# Patient Record
Sex: Female | Born: 1945
Health system: Southern US, Community
[De-identification: ages and names within clinical notes are randomized; demographics above are authoritative.]

## PROBLEM LIST (undated history)

## (undated) DIAGNOSIS — I10 Essential (primary) hypertension: Secondary | ICD-10-CM

## (undated) DIAGNOSIS — F32A Depression, unspecified: Secondary | ICD-10-CM

## (undated) DIAGNOSIS — M810 Age-related osteoporosis without current pathological fracture: Secondary | ICD-10-CM

## (undated) DIAGNOSIS — Z5189 Encounter for other specified aftercare: Secondary | ICD-10-CM

## (undated) DIAGNOSIS — Z972 Presence of dental prosthetic device (complete) (partial): Secondary | ICD-10-CM

## (undated) DIAGNOSIS — H409 Unspecified glaucoma: Secondary | ICD-10-CM

## (undated) DIAGNOSIS — Z8619 Personal history of other infectious and parasitic diseases: Secondary | ICD-10-CM

## (undated) DIAGNOSIS — R2689 Other abnormalities of gait and mobility: Secondary | ICD-10-CM

## (undated) DIAGNOSIS — F419 Anxiety disorder, unspecified: Secondary | ICD-10-CM

## (undated) DIAGNOSIS — H269 Unspecified cataract: Secondary | ICD-10-CM

## (undated) DIAGNOSIS — T8859XA Other complications of anesthesia, initial encounter: Secondary | ICD-10-CM

## (undated) DIAGNOSIS — K759 Inflammatory liver disease, unspecified: Secondary | ICD-10-CM

## (undated) DIAGNOSIS — A419 Sepsis, unspecified organism: Secondary | ICD-10-CM

## (undated) DIAGNOSIS — K219 Gastro-esophageal reflux disease without esophagitis: Secondary | ICD-10-CM

## (undated) HISTORY — DX: Sepsis, unspecified organism: A41.9

## (undated) HISTORY — DX: Unspecified glaucoma: H40.9

## (undated) HISTORY — DX: Anxiety disorder, unspecified: F41.9

## (undated) HISTORY — DX: Unspecified cataract: H26.9

## (undated) HISTORY — DX: Age-related osteoporosis without current pathological fracture: M81.0

## (undated) HISTORY — PX: ABDOMINAL HYSTERECTOMY: SHX81

## (undated) HISTORY — DX: Encounter for other specified aftercare: Z51.89

## (undated) HISTORY — PX: OTHER SURGICAL HISTORY: SHX169

## (undated) HISTORY — DX: Essential (primary) hypertension: I10

## (undated) HISTORY — PX: STOMACH SURGERY: SHX791

## (undated) HISTORY — DX: Personal history of other infectious and parasitic diseases: Z86.19

## (undated) HISTORY — DX: Gastro-esophageal reflux disease without esophagitis: K21.9

---

## 2001-01-11 DIAGNOSIS — F419 Anxiety disorder, unspecified: Secondary | ICD-10-CM | POA: Insufficient documentation

## 2001-01-11 DIAGNOSIS — G4700 Insomnia, unspecified: Secondary | ICD-10-CM | POA: Insufficient documentation

## 2001-01-11 DIAGNOSIS — K219 Gastro-esophageal reflux disease without esophagitis: Secondary | ICD-10-CM | POA: Insufficient documentation

## 2003-06-04 ENCOUNTER — Other Ambulatory Visit: Payer: Self-pay

## 2004-10-22 ENCOUNTER — Ambulatory Visit: Payer: Self-pay | Admitting: Family Medicine

## 2005-01-11 HISTORY — PX: COLONOSCOPY: SHX174

## 2006-05-06 DIAGNOSIS — I10 Essential (primary) hypertension: Secondary | ICD-10-CM | POA: Insufficient documentation

## 2009-01-11 HISTORY — PX: MOUTH SURGERY: SHX715

## 2009-08-10 DIAGNOSIS — R7303 Prediabetes: Secondary | ICD-10-CM | POA: Insufficient documentation

## 2010-01-21 ENCOUNTER — Ambulatory Visit: Payer: Self-pay | Admitting: Family Medicine

## 2013-02-13 ENCOUNTER — Ambulatory Visit: Payer: Self-pay | Admitting: Family Medicine

## 2013-02-13 DIAGNOSIS — M810 Age-related osteoporosis without current pathological fracture: Secondary | ICD-10-CM | POA: Insufficient documentation

## 2013-08-21 LAB — CBC AND DIFFERENTIAL
HCT: 39 % (ref 36–46)
Hemoglobin: 12.6 g/dL (ref 12.0–16.0)
Platelets: 319 10*3/uL (ref 150–399)
WBC: 7.3 10^3/mL

## 2013-08-21 LAB — BASIC METABOLIC PANEL
BUN: 10 mg/dL (ref 4–21)
Creatinine: 0.9 mg/dL (ref 0.5–1.1)
GLUCOSE: 133 mg/dL
Potassium: 5.5 mmol/L — AB (ref 3.4–5.3)
SODIUM: 140 mmol/L (ref 137–147)

## 2013-08-21 LAB — TSH: TSH: 2.3 u[IU]/mL (ref 0.41–5.90)

## 2013-08-21 LAB — LIPID PANEL
Cholesterol: 168 mg/dL (ref 0–200)
HDL: 44 mg/dL (ref 35–70)
LDL Cholesterol: 103 mg/dL
Triglycerides: 103 mg/dL (ref 40–160)

## 2013-08-21 LAB — HEPATIC FUNCTION PANEL
ALT: 28 U/L (ref 7–35)
AST: 34 U/L (ref 13–35)

## 2014-03-27 DIAGNOSIS — H40003 Preglaucoma, unspecified, bilateral: Secondary | ICD-10-CM | POA: Diagnosis not present

## 2014-04-23 ENCOUNTER — Ambulatory Visit
Admit: 2014-04-23 | Disposition: A | Discharge status: Home / Self Care | Payer: Self-pay | Attending: Family Medicine | Admitting: Family Medicine

## 2014-04-23 DIAGNOSIS — Z1231 Encounter for screening mammogram for malignant neoplasm of breast: Secondary | ICD-10-CM | POA: Diagnosis not present

## 2014-05-29 DIAGNOSIS — M810 Age-related osteoporosis without current pathological fracture: Secondary | ICD-10-CM | POA: Diagnosis not present

## 2014-05-29 DIAGNOSIS — I10 Essential (primary) hypertension: Secondary | ICD-10-CM | POA: Diagnosis not present

## 2014-05-29 DIAGNOSIS — K219 Gastro-esophageal reflux disease without esophagitis: Secondary | ICD-10-CM | POA: Diagnosis not present

## 2014-05-29 DIAGNOSIS — R7309 Other abnormal glucose: Secondary | ICD-10-CM | POA: Diagnosis not present

## 2014-05-29 LAB — HEMOGLOBIN A1C: HEMOGLOBIN A1C: 5.8 % (ref 4.0–6.0)

## 2014-06-13 ENCOUNTER — Telehealth: Payer: Self-pay | Admitting: Family Medicine

## 2014-06-13 MED ORDER — DIAZEPAM 5 MG PO TABS: 5.0000 mg | ORAL_TABLET | Freq: Four times a day (QID) | ORAL | Status: DC

## 2014-06-13 NOTE — Telephone Encounter (Signed)
Pt called back to see if this had been refilled. Pt state that she has been trying to get this refilled before she ran out and she can not go without this medication. Pt was crying on the phone because she is afraid this isn't going to be called in today. Thanks TNP

## 2014-06-13 NOTE — Telephone Encounter (Signed)
Please call in rx for diazepam 5mg  one tablet four times daily, as needed, #120, rf x 5

## 2014-06-13 NOTE — Telephone Encounter (Signed)
Pt called to request a refill on Diazepam 5mg .  Pt states she took her last pill last night.  [facility].  BJ#[phone]/YW

## 2014-06-13 NOTE — Telephone Encounter (Signed)
Patient notified. Rx phoned into pharmacy.

## 2014-10-23 DIAGNOSIS — H401131 Primary open-angle glaucoma, bilateral, mild stage: Secondary | ICD-10-CM | POA: Diagnosis not present

## 2014-11-22 DIAGNOSIS — H409 Unspecified glaucoma: Secondary | ICD-10-CM | POA: Insufficient documentation

## 2014-11-22 DIAGNOSIS — L989 Disorder of the skin and subcutaneous tissue, unspecified: Secondary | ICD-10-CM | POA: Insufficient documentation

## 2014-11-22 DIAGNOSIS — E789 Disorder of lipoprotein metabolism, unspecified: Secondary | ICD-10-CM | POA: Insufficient documentation

## 2014-11-26 ENCOUNTER — Encounter: Payer: Self-pay | Admitting: Family Medicine

## 2014-11-26 ENCOUNTER — Ambulatory Visit (INDEPENDENT_AMBULATORY_CARE_PROVIDER_SITE_OTHER): Payer: Medicare Other | Admitting: Family Medicine

## 2014-11-26 VITALS — BP 128/70 | HR 64 | Temp 98.5°F | Resp 16 | Wt 184.0 lb

## 2014-11-26 DIAGNOSIS — Z23 Encounter for immunization: Secondary | ICD-10-CM | POA: Diagnosis not present

## 2014-11-26 DIAGNOSIS — R7303 Prediabetes: Secondary | ICD-10-CM | POA: Diagnosis not present

## 2014-11-26 DIAGNOSIS — K219 Gastro-esophageal reflux disease without esophagitis: Secondary | ICD-10-CM

## 2014-11-26 DIAGNOSIS — I10 Essential (primary) hypertension: Secondary | ICD-10-CM

## 2014-11-26 LAB — POCT GLYCOSYLATED HEMOGLOBIN (HGB A1C)
ESTIMATED AVERAGE GLUCOSE: 123
HEMOGLOBIN A1C: 5.9

## 2014-11-26 NOTE — Progress Notes (Signed)
Patient: Joan Baldwin Female    DOB: [birth year]   69 y.o.   MRN: [id number] Visit Date: 11/26/2014  Today's Provider: Lelon Huh, MD   Chief Complaint  Patient presents with  . Hypertension    follow up  . Diabetes    follow up  . Gastroesophageal Reflux    follow up  . Anxiety    follow up   Subjective:    HPI   Hypertension, follow-up:  BP Readings from Last 3 Encounters:  11/26/14 128/70  05/29/14 124/70    She was last seen for hypertension 6 months ago.  BP at that visit was  124/70. Management since that visit includes no changes. She reports good compliance with treatment. She is not having side effects.  She is exercising. She is not adherent to low salt diet.   Outside blood pressures are 130's/70's. She is experiencing none.  Patient denies chest pain, chest pressure/discomfort, claudication, dyspnea, exertional chest pressure/discomfort, fatigue, irregular heart beat, lower extremity edema, near-syncope, orthopnea, palpitations and paroxysmal nocturnal dyspnea.   Cardiovascular risk factors include advanced age (older than 71 for men, 66 for women), dyslipidemia and hypertension.  Use of agents associated with hypertension: NSAIDS.     Weight trend: stable Wt Readings from Last 3 Encounters:  11/26/14 184 lb (83.462 kg)  05/29/14 176 lb (79.833 kg)    Current diet: in general, an "unhealthy" diet  ------------------------------------------------------------------------   GERD, Follow up:  The patient was last seen for GERD 6 months ago. Changes made since that visit include no changes.  She reports good compliance with treatment. Current treatment includes Omeprazole 20mg  daily. She is not having side effects.     ------------------------------------------------------------------------   Prediabetes, Follow-up:   Lab Results  Component Value Date   HGBA1C 5.8 05/29/2014    Last seen for for this6 months ago.    Management since that visit includes no changes. Current symptoms include none and have been stable.  Weight trend: stable Prior visit with dietician: no Current diet: in general, an "unhealthy" diet Current exercise: walking the treadmill twice a week  Pertinent Labs:    Component Value Date/Time   CHOL 168 08/21/2013   TRIG 103 08/21/2013   CREATININE 0.9 08/21/2013    Wt Readings from Last 3 Encounters:  11/26/14 184 lb (83.462 kg)  05/29/14 176 lb (79.833 kg)    Anxiety Follow up: Last office visit was 6 month ago and no changes were made. Patient reports good compliance with treatment, good tolerance and good symptom control.     Allergies  Allergen Reactions  . Codeine Itching  . Ultram  [Tramadol Hcl]     Insomnia   Previous Medications   ASPIRIN 81 MG TABLET    Take 1 tablet by mouth daily.   CALCIUM CARBONATE-VITAMIN D (CALCIUM 600/VITAMIN D PO)    Take 1 tablet by mouth 2 (two) times daily.   DIAZEPAM (VALIUM) 5 MG TABLET    Take 1 tablet (5 mg total) by mouth 4 (four) times daily. Take 1 tablet 1 times daily   FIBER POWD    Take 2 scoop by mouth daily.   HYDROCHLOROTHIAZIDE (HYDRODIURIL) 25 MG TABLET    Take 1 tablet by mouth daily.   LATANOPROST (XALATAN) 0.005 % OPHTHALMIC SOLUTION    Place 1 drop into both eyes at bedtime.   LISINOPRIL (PRINIVIL,ZESTRIL) 10 MG TABLET    Take 0.5 tablets by mouth daily.   MULTIPLE VITAMINS-MINERALS (  MULTIVITAMIN ADULT PO)    Take 1 tablet by mouth daily.   OMEGA-3 FATTY ACIDS (FISH OIL) 1000 MG CAPS    Take 1 capsule by mouth 2 (two) times daily.   OMEPRAZOLE (PRILOSEC) 20 MG CAPSULE    Take 1 capsule by mouth daily.   TIMOLOL MALEATE 0.5 % (DAILY) SOLN    Place 1 drop into both eyes at bedtime.    Review of Systems  Constitutional: Negative for fever, chills, diaphoresis, appetite change and fatigue.  Respiratory: Negative for chest tightness and shortness of breath.   Cardiovascular: Negative for chest pain and  palpitations.  Gastrointestinal: Negative for nausea, vomiting and abdominal pain.  Endocrine: Negative for cold intolerance, heat intolerance, polydipsia, polyphagia and polyuria.  Neurological: Negative for dizziness and weakness.  Hematological: Bruises/bleeds easily.    Social History  Substance Use Topics  . Smoking status: Former Smoker -- 1.00 packs/day for 30 years    Types: Cigarettes    Quit date: 01/11/2001  . Smokeless tobacco: Not on file  . Alcohol Use: No   Objective:   BP 128/70 mmHg  Pulse 64  Temp(Src) 98.5 F (36.9 C) (Oral)  Resp 16  Wt 184 lb (83.462 kg)  SpO2 96%  Physical Exam   General Appearance:    Alert, cooperative, no distress  Eyes:    PERRL, conjunctiva/corneas clear, EOM's intact       Lungs:     Clear to auscultation bilaterally, respirations unlabored  Heart:    Regular rate and rhythm  Neurologic:   Awake, alert, oriented x 3. No apparent focal neurological           defect.        Results for orders placed or performed in visit on 11/26/14  POCT HgB A1C  Result Value Ref Range   Hemoglobin A1C 5.9    Est. average glucose Bld gHb Est-mCnc 123        Assessment & Plan:     1. Prediabetes  - POCT HgB A1C  2. Need for influenza vaccination  - Flu vaccine HIGH DOSE PF (Fluzone High dose)  3. Gastroesophageal reflux disease without esophagitis Doing well on daily omeprazole. She is interested in getting off this medication. Recommend she try taking QOD. Consider h2blocker.  - Magnesium  4. Essential (primary) hypertension Well controlled.  Continue current medications.   - Renal function panel - Magnesium  5. Need for pneumococcal vaccination  - Pneumococcal conjugate vaccine 13-valent IM       Lelon Huh, MD  [facility]

## 2014-11-27 ENCOUNTER — Encounter: Payer: Self-pay | Admitting: Family Medicine

## 2014-11-27 LAB — RENAL FUNCTION PANEL
Albumin: 4.7 g/dL (ref 3.5–4.8)
BUN / CREAT RATIO: 13 (ref 11–26)
BUN: 11 mg/dL (ref 8–27)
CO2: 26 mmol/L (ref 18–29)
CREATININE: 0.83 mg/dL (ref 0.57–1.00)
Calcium: 9.6 mg/dL (ref 8.7–10.3)
Chloride: 93 mmol/L — ABNORMAL LOW (ref 97–106)
GFR calc non Af Amer: 71 mL/min/{1.73_m2} (ref >59)
GFR, EST AFRICAN AMERICAN: 82 mL/min/{1.73_m2} (ref >59)
Glucose: 121 mg/dL — ABNORMAL HIGH (ref 65–99)
Phosphorus: 4.1 mg/dL (ref 2.5–4.5)
Potassium: 4.7 mmol/L (ref 3.5–5.2)
SODIUM: 136 mmol/L (ref 136–144)

## 2014-11-27 LAB — MAGNESIUM: Magnesium: 2.2 mg/dL (ref 1.6–2.3)

## 2014-12-08 ENCOUNTER — Other Ambulatory Visit: Payer: Self-pay | Admitting: Family Medicine

## 2014-12-08 NOTE — Telephone Encounter (Signed)
Please call in diazepam.  

## 2014-12-09 NOTE — Telephone Encounter (Signed)
Rx called in to pharmacy. 

## 2015-02-04 ENCOUNTER — Other Ambulatory Visit: Payer: Self-pay | Admitting: Family Medicine

## 2015-02-04 DIAGNOSIS — F419 Anxiety disorder, unspecified: Secondary | ICD-10-CM

## 2015-02-04 NOTE — Telephone Encounter (Signed)
Please call in diazepam.  

## 2015-04-02 DIAGNOSIS — H401131 Primary open-angle glaucoma, bilateral, mild stage: Secondary | ICD-10-CM | POA: Diagnosis not present

## 2015-04-03 ENCOUNTER — Other Ambulatory Visit: Payer: Self-pay | Admitting: Family Medicine

## 2015-04-04 NOTE — Telephone Encounter (Signed)
Please call in diazepam.  

## 2015-04-22 DIAGNOSIS — H401131 Primary open-angle glaucoma, bilateral, mild stage: Secondary | ICD-10-CM | POA: Diagnosis not present

## 2015-05-04 ENCOUNTER — Other Ambulatory Visit: Payer: Self-pay | Admitting: Family Medicine

## 2015-05-05 ENCOUNTER — Other Ambulatory Visit: Payer: Self-pay | Admitting: Family Medicine

## 2015-05-05 NOTE — Telephone Encounter (Signed)
Rx called in to pharmacy. 

## 2015-05-05 NOTE — Telephone Encounter (Signed)
Please call in diazepam.  

## 2015-05-26 ENCOUNTER — Ambulatory Visit: Payer: Medicare Other | Admitting: Family Medicine

## 2015-05-26 ENCOUNTER — Encounter: Payer: Self-pay | Admitting: Family Medicine

## 2015-05-26 ENCOUNTER — Ambulatory Visit (INDEPENDENT_AMBULATORY_CARE_PROVIDER_SITE_OTHER): Payer: Medicare Other | Admitting: Family Medicine

## 2015-05-26 VITALS — BP 142/78 | HR 68 | Temp 98.6°F | Resp 16 | Wt 183.0 lb

## 2015-05-26 DIAGNOSIS — I10 Essential (primary) hypertension: Secondary | ICD-10-CM | POA: Diagnosis not present

## 2015-05-26 DIAGNOSIS — R7303 Prediabetes: Secondary | ICD-10-CM

## 2015-05-26 DIAGNOSIS — K219 Gastro-esophageal reflux disease without esophagitis: Secondary | ICD-10-CM

## 2015-05-26 LAB — POCT GLYCOSYLATED HEMOGLOBIN (HGB A1C)
Est. average glucose Bld gHb Est-mCnc: 131
HEMOGLOBIN A1C: 6.2

## 2015-05-26 NOTE — Progress Notes (Signed)
Patient: Joan Baldwin Female    DOB: May 01, 1945   70 y.o.   MRN: [id number] Visit Date: 05/26/2015  Today's Provider: Lelon Huh, MD   Chief Complaint  Patient presents with  . Hypertension    follow up  . Gastroesophageal Reflux    follow up  . Hyperglycemia    follow up   Subjective:    HPI  Hypertension, follow-up:  BP Readings from Last 3 Encounters:  11/26/14 128/70  05/29/14 124/70    She was last seen for hypertension 6 months ago.  BP at that visit was 128/70. Management since that visit includes no changes. She reports good compliance with treatment. She is not having side effects.  She is not exercising. She is not adherent to low salt diet.   Outside blood pressures are 130/60. She is experiencing dyspnea.  Patient denies chest pain, chest pressure/discomfort, claudication, exertional chest pressure/discomfort, fatigue, irregular heart beat, lower extremity edema, near-syncope, orthopnea, palpitations and paroxysmal nocturnal dyspnea.   Cardiovascular risk factors include advanced age (older than 62 for men, 27 for women) and hypertension.  Use of agents associated with hypertension: NSAIDS.     Weight trend: stable Wt Readings from Last 3 Encounters:  11/26/14 184 lb (83.462 kg)  05/29/14 176 lb (79.833 kg)    Current diet: in general, an "unhealthy" diet  ------------------------------------------------------------------------  Follow up GERD: Last office visit was 6 months ago. Management during that visit includes advising patient to take Omeprazole every other day instead of daily due to patients desire to come off medication. Today patient comes in stating she tried cutting back to 1 tablet every other day, but reflux worsened so she went back to 1 pill every day.     Prediabetes, Follow-up:   Lab Results  Component Value Date   HGBA1C 5.9 11/26/2014   HGBA1C 5.8 05/29/2014   GLUCOSE 121* 11/26/2014    Last seen for for  this6 months ago.  Management since that visit includes no changes. Current symptoms include visual disturbances and have been stable.  Weight trend: stable Prior visit with dietician: no Current diet: in general, an "unhealthy" diet   She admits to eating ice cream nearly every day and has been consuming increasing amounts over the last several months.   Current exercise: none  Pertinent Labs:    Component Value Date/Time   CHOL 168 08/21/2013   TRIG 103 08/21/2013   CREATININE 0.83 11/26/2014 1129   CREATININE 0.9 08/21/2013    Wt Readings from Last 3 Encounters:  11/26/14 184 lb (83.462 kg)  05/29/14 176 lb (79.833 kg)       Allergies  Allergen Reactions  . Codeine Itching  . Ultram  [Tramadol Hcl]     Insomnia   Previous Medications   ASPIRIN 81 MG TABLET    Take 1 tablet by mouth daily.   CALCIUM CARBONATE-VITAMIN D (CALCIUM 600/VITAMIN D PO)    Take 1 tablet by mouth 2 (two) times daily.   DIAZEPAM (VALIUM) 5 MG TABLET    TAKE ONE TABLET BY MOUTH EVERY 6 HOURS AS NEEDED FOR ANXIETY   FIBER POWD    Take 2 scoop by mouth daily.   HYDROCHLOROTHIAZIDE (HYDRODIURIL) 25 MG TABLET    TAKE ONE TABLET BY MOUTH ONCE DAILY   LATANOPROST (XALATAN) 0.005 % OPHTHALMIC SOLUTION    Place 1 drop into both eyes at bedtime.   LISINOPRIL (PRINIVIL,ZESTRIL) 10 MG TABLET    TAKE ONE-HALF TABLET  BY MOUTH ONCE DAILY   MULTIPLE VITAMINS-MINERALS (MULTIVITAMIN ADULT PO)    Take 1 tablet by mouth daily.   OMEGA-3 FATTY ACIDS (FISH OIL) 1000 MG CAPS    Take 1 capsule by mouth 2 (two) times daily.   OMEPRAZOLE (PRILOSEC) 20 MG CAPSULE    TAKE ONE CAPSULE BY MOUTH ONCE DAILY   TIMOLOL MALEATE 0.5 % (DAILY) SOLN    Place 1 drop into both eyes at bedtime.    Review of Systems  Constitutional: Negative for fever, chills, appetite change and fatigue.  Respiratory: Positive for shortness of breath. Negative for chest tightness.   Cardiovascular: Negative for chest pain and palpitations.    Gastrointestinal: Negative for nausea, vomiting and abdominal pain.  Neurological: Negative for dizziness and weakness.    Social History  Substance Use Topics  . Smoking status: Former Smoker -- 1.00 packs/day for 30 years    Types: Cigarettes    Quit date: 01/11/2001  . Smokeless tobacco: Not on file  . Alcohol Use: No   Objective:   BP 142/78 mmHg  Pulse 68  Temp(Src) 98.6 F (37 C) (Oral)  Resp 16  Wt 183 lb (83.008 kg)  SpO2 98%  Physical Exam  General appearance: alert, well developed, well nourished, cooperative and in no distress Head: Normocephalic, without obvious abnormality, atraumatic Lungs: Respirations even and unlabored Extremities: No gross deformities Skin: Skin color, texture, turgor normal. No rashes seen  Psych: Appropriate mood and affect. Neurologic: Mental status: Alert, oriented to person, place, and time, thought content appropriate.   Results for orders placed or performed in visit on 05/26/15  POCT HgB A1C  Result Value Ref Range   Hemoglobin A1C 6.2    Est. average glucose Bld gHb Est-mCnc 131         Assessment & Plan:     1. Prediabetes Counseled extensively on healthy eating choices, particularly to avoid ICE Cream which she consumes excessively - POCT HgB A1C  2. Essential (primary) hypertension Well controlled.  Continue current medications.    3. Gastroesophageal reflux disease without esophagitis Well controlled.  Continue current medications.          Lelon Huh, MD  [facility]

## 2015-05-26 NOTE — Patient Instructions (Signed)
It is recommended to engage in 150 minutes of moderate exercise every week.   Avoid sweets and white starchy foods Healthy snacks include nuts, vegetables (except potatoes and corn), and eggs.

## 2015-08-01 ENCOUNTER — Other Ambulatory Visit: Payer: Self-pay | Admitting: Family Medicine

## 2015-08-01 NOTE — Telephone Encounter (Signed)
Rx called in.   Thanks,   -Laura  

## 2015-08-01 NOTE — Telephone Encounter (Signed)
Pt stated that the pharmacy called her to advised that the fax they received from our office for the RX of diazepam (VALIUM) 5 MG tablet was blank and asked that we re-fax it. Please see Dr. Maralyn Sago response below. The pharmacy has faxed another request for the refill as well. Pt request that this be called in today b/c she will run out over the weekend. Thanks TNP

## 2015-08-01 NOTE — Telephone Encounter (Signed)
Prescription phoned into pharmacy. Patient advised.

## 2015-08-01 NOTE — Telephone Encounter (Signed)
This was already called in today.   Thanks,   -Mickel Baas

## 2015-08-01 NOTE — Telephone Encounter (Signed)
Please call in diazepam.  

## 2015-08-01 NOTE — Telephone Encounter (Signed)
Pt contacted office for refill request on the following medications:  diazepam (VALIUM) 5 MG tablet.  [facility].  [phone]

## 2015-10-22 DIAGNOSIS — H401131 Primary open-angle glaucoma, bilateral, mild stage: Secondary | ICD-10-CM | POA: Diagnosis not present

## 2015-10-30 ENCOUNTER — Other Ambulatory Visit: Payer: Self-pay | Admitting: Family Medicine

## 2015-11-24 ENCOUNTER — Ambulatory Visit (INDEPENDENT_AMBULATORY_CARE_PROVIDER_SITE_OTHER): Payer: Medicare Other | Admitting: Family Medicine

## 2015-11-24 ENCOUNTER — Encounter: Payer: Self-pay | Admitting: Family Medicine

## 2015-11-24 VITALS — BP 120/80 | HR 65 | Temp 98.2°F | Resp 16 | Wt 169.0 lb

## 2015-11-24 DIAGNOSIS — I10 Essential (primary) hypertension: Secondary | ICD-10-CM | POA: Diagnosis not present

## 2015-11-24 DIAGNOSIS — F419 Anxiety disorder, unspecified: Secondary | ICD-10-CM | POA: Diagnosis not present

## 2015-11-24 DIAGNOSIS — R7303 Prediabetes: Secondary | ICD-10-CM

## 2015-11-24 DIAGNOSIS — K219 Gastro-esophageal reflux disease without esophagitis: Secondary | ICD-10-CM | POA: Diagnosis not present

## 2015-11-24 LAB — POCT GLYCOSYLATED HEMOGLOBIN (HGB A1C)
Est. average glucose Bld gHb Est-mCnc: 126
Hemoglobin A1C: 6

## 2015-11-24 MED ORDER — DIAZEPAM 5 MG PO TABS
5.0000 mg | ORAL_TABLET | Freq: Four times a day (QID) | ORAL | 5 refills | Status: DC | PRN
Start: 2015-11-24 — End: 2016-05-26

## 2015-11-24 NOTE — Progress Notes (Signed)
Patient: Joan Baldwin Female    DOB: 02-03-1945   70 y.o.   MRN: [id number] Visit Date: 11/24/2015  Today's Provider: Lelon Huh, MD   Chief Complaint  Patient presents with  . Follow-up  . Gastroesophageal Reflux  . Hypertension   Subjective:    HPI   Prediabetes From 05/26/2015- Counseled extensively on healthy eating choices, particularly to avoid ICE Cream.  HgbA1c-6.2  Wt Readings from Last 3 Encounters:  11/24/15 169 lb (76.7 kg)  05/26/15 183 lb (83 kg)  11/26/14 184 lb (83.5 kg)    Gastroesophageal reflux disease without esophagitis From 05/26/2015-no changes. Having no reflux symptoms on omeprazole.     Hypertension, follow-up:  BP Readings from Last 3 Encounters:  05/26/15 (!) 142/78  11/26/14 128/70  05/29/14 124/70    She was last seen for hypertension 6 months ago.  BP at that visit was 142/78. Management since that visit includes; no changes.She reports good compliance with treatment. She is not having side effects. none She is exercising. She is adherent to low salt diet.   Outside blood pressures are normal. She is experiencing none.  Patient denies none.   Cardiovascular risk factors include prediabetes.  Use of agents associated with hypertension: none.   ----------------------------------------------------------------  Follow up anxiety She continue to take diazepam twice every day which she feels continues to work well. Denies any difficulty with memory, fatigue, or confusion.   Allergies  Allergen Reactions  . Codeine Itching  . Ultram  [Tramadol Hcl]     Insomnia     Current Outpatient Prescriptions:  .  aspirin 81 MG tablet, Take 1 tablet by mouth daily., Disp: , Rfl:  .  Calcium Carbonate-Vitamin D (CALCIUM 600/VITAMIN D PO), Take 1 tablet by mouth 2 (two) times daily., Disp: , Rfl:  .  diazepam (VALIUM) 5 MG tablet, TAKE ONE TABLET BY MOUTH EVERY 6 HOURS AS NEEDED FOR ANXIETY, Disp: 120 tablet, Rfl: 3 .  Fiber  POWD, Take 2 scoop by mouth daily., Disp: , Rfl:  .  hydrochlorothiazide (HYDRODIURIL) 25 MG tablet, TAKE ONE TABLET BY MOUTH ONCE DAILY, Disp: 30 tablet, Rfl: 12 .  latanoprost (XALATAN) 0.005 % ophthalmic solution, Place 1 drop into both eyes at bedtime., Disp: , Rfl:  .  lisinopril (PRINIVIL,ZESTRIL) 10 MG tablet, TAKE ONE-HALF TABLET BY MOUTH ONCE DAILY, Disp: 30 tablet, Rfl: 12 .  Multiple Vitamins-Minerals (MULTIVITAMIN ADULT PO), Take 1 tablet by mouth daily., Disp: , Rfl:  .  Omega-3 Fatty Acids (FISH OIL) 1000 MG CAPS, Take 1 capsule by mouth 2 (two) times daily., Disp: , Rfl:  .  omeprazole (PRILOSEC) 20 MG capsule, TAKE ONE CAPSULE BY MOUTH ONCE DAILY, Disp: 30 capsule, Rfl: 12 .  Timolol Maleate 0.5 % (DAILY) SOLN, Place 1 drop into both eyes at bedtime., Disp: , Rfl:   Review of Systems  Constitutional: Negative for appetite change, chills, fatigue and fever.  Respiratory: Negative for chest tightness and shortness of breath.   Cardiovascular: Negative for chest pain and palpitations.  Gastrointestinal: Negative for abdominal pain, nausea and vomiting.  Neurological: Negative for dizziness and weakness.    Social History  Substance Use Topics  . Smoking status: Former Smoker    Packs/day: 1.00    Years: 30.00    Types: Cigarettes    Quit date: 01/11/2001  . Smokeless tobacco: Not on file  . Alcohol use No   Objective:   BP 120/80 (BP Location: Right Arm, Patient  Position: Sitting, Cuff Size: Normal)   Pulse 65   Temp 98.2 F (36.8 C) (Oral)   Resp 16   Wt 169 lb (76.7 kg)   SpO2 100%   Physical Exam   General Appearance:    Alert, cooperative, no distress  Eyes:    PERRL, conjunctiva/corneas clear, EOM's intact       Lungs:     Clear to auscultation bilaterally, respirations unlabored  Heart:    Regular rate and rhythm  Neurologic:   Awake, alert, oriented x 3. No apparent focal neurological           defect.       Results for orders placed or performed in  visit on 11/24/15  POCT glycosylated hemoglobin (Hb A1C)  Result Value Ref Range   Hemoglobin A1C 6.0    Est. average glucose Bld gHb Est-mCnc 126        Assessment & Plan:     1. Prediabetes Improving. Continue to avoid sweets and starchy foods and try to exercise more.  - POCT glycosylated hemoglobin (Hb A1C)  2. Essential (primary) hypertension Well controlled.    3. Anxiety Doing well with current regiment of diazepam which was refilled today.   4. Gastroesophageal reflux disease without esophagitis Doing well on omeprazole. Continue current regiment.   Follow up 6 months.       The entirety of the information documented in the History of Present Illness, Review of Systems and Physical Exam were personally obtained by me. Portions of this information were initially documented by Theressa Millard, CMA and reviewed by me for thoroughness and accuracy.    Lelon Huh, MD  [facility]

## 2015-12-30 ENCOUNTER — Other Ambulatory Visit: Payer: Self-pay | Admitting: Family Medicine

## 2016-01-16 ENCOUNTER — Ambulatory Visit (INDEPENDENT_AMBULATORY_CARE_PROVIDER_SITE_OTHER): Payer: Medicare Other | Admitting: Family Medicine

## 2016-01-16 ENCOUNTER — Encounter: Payer: Self-pay | Admitting: Family Medicine

## 2016-01-16 VITALS — BP 126/78 | HR 98 | Temp 98.1°F | Resp 18 | Wt 172.0 lb

## 2016-01-16 DIAGNOSIS — R05 Cough: Secondary | ICD-10-CM

## 2016-01-16 DIAGNOSIS — J029 Acute pharyngitis, unspecified: Secondary | ICD-10-CM

## 2016-01-16 DIAGNOSIS — J321 Chronic frontal sinusitis: Secondary | ICD-10-CM | POA: Diagnosis not present

## 2016-01-16 DIAGNOSIS — J069 Acute upper respiratory infection, unspecified: Secondary | ICD-10-CM

## 2016-01-16 DIAGNOSIS — R059 Cough, unspecified: Secondary | ICD-10-CM

## 2016-01-16 LAB — POC INFLUENZA A&B (BINAX/QUICKVUE)
Influenza A, POC: NEGATIVE
Influenza B, POC: NEGATIVE

## 2016-01-16 MED ORDER — AZITHROMYCIN 250 MG PO TABS: ORAL_TABLET | ORAL | 0 refills | Status: AC

## 2016-01-16 MED ORDER — HYDROCODONE-HOMATROPINE 5-1.5 MG/5ML PO SYRP: 5.0000 mL | ORAL_SOLUTION | Freq: Three times a day (TID) | ORAL | 0 refills | Status: DC | PRN

## 2016-01-16 NOTE — Progress Notes (Signed)
Patient: Joan Baldwin Female    DOB: 1945-01-14   71 y.o.   MRN: [id number] Visit Date: 01/16/2016  Today's Provider: Lelon Huh, MD   Chief Complaint  Patient presents with  . Sore Throat  . Cough   Subjective:    HPI Pt is here today for sore throat, cough and congestion. She reports that it started 2 days ago, this will make the 3rd day. She reports that she had a fever this morning according to her thermometer she had a temp of 100. She denies chills, bodyaches or sweats. Her husband had something similar and they treated him for Pneumonia. She reports that she is short of breath, weak and tired.       Allergies  Allergen Reactions  . Codeine Itching  . Ultram  [Tramadol Hcl]     Insomnia     Current Outpatient Prescriptions:  .  aspirin 81 MG tablet, Take 1 tablet by mouth daily., Disp: , Rfl:  .  Calcium Carbonate-Vitamin D (CALCIUM 600/VITAMIN D PO), Take 1 tablet by mouth 2 (two) times daily., Disp: , Rfl:  .  diazepam (VALIUM) 5 MG tablet, Take 1 tablet (5 mg total) by mouth every 6 (six) hours as needed. for anxiety, Disp: 120 tablet, Rfl: 5 .  Fiber POWD, Take 2 scoop by mouth daily., Disp: , Rfl:  .  hydrochlorothiazide (HYDRODIURIL) 25 MG tablet, TAKE ONE TABLET BY MOUTH ONCE DAILY, Disp: 30 tablet, Rfl: 12 .  latanoprost (XALATAN) 0.005 % ophthalmic solution, Place 1 drop into both eyes at bedtime., Disp: , Rfl:  .  lisinopril (PRINIVIL,ZESTRIL) 10 MG tablet, TAKE ONE-HALF TABLET BY MOUTH ONCE DAILY, Disp: 30 tablet, Rfl: 12 .  Multiple Vitamins-Minerals (MULTIVITAMIN ADULT PO), Take 1 tablet by mouth daily., Disp: , Rfl:  .  Omega-3 Fatty Acids (FISH OIL) 1000 MG CAPS, Take 1 capsule by mouth 2 (two) times daily., Disp: , Rfl:  .  omeprazole (PRILOSEC) 20 MG capsule, TAKE ONE CAPSULE BY MOUTH ONCE DAILY, Disp: 30 capsule, Rfl: 12 .  Timolol Maleate 0.5 % (DAILY) SOLN, Place 1 drop into both eyes at bedtime., Disp: , Rfl:   Review of Systems    Constitutional: Positive for fatigue and fever.  HENT: Positive for congestion, postnasal drip and sore throat.   Eyes: Negative.   Respiratory: Positive for shortness of breath.   Cardiovascular: Negative.   Gastrointestinal: Negative.   Endocrine: Negative.   Genitourinary: Negative.   Musculoskeletal: Negative.   Skin: Negative.   Allergic/Immunologic: Negative.   Neurological: Positive for weakness.  Hematological: Negative.   Psychiatric/Behavioral: Negative.     Social History  Substance Use Topics  . Smoking status: Former Smoker    Packs/day: 1.00    Years: 30.00    Types: Cigarettes    Quit date: 01/11/2001  . Smokeless tobacco: Never Used  . Alcohol use No   Objective:   BP 126/78 (BP Location: Left Arm, Patient Position: Sitting, Cuff Size: Normal)   Pulse 98   Temp 98.1 F (36.7 C) (Oral)   Resp 18   Wt 172 lb (78 kg)   SpO2 98%   Physical Exam  General Appearance:    Alert, cooperative, no distress  HENT:   bilateral TM normal without fluid or infection, neck has bilateral anterior cervical nodes enlarged, pharynx erythematous without exudate, frontal sinuses tender and nasal mucosa pale and congested  Eyes:    PERRL, conjunctiva/corneas clear, EOM's intact  Lungs:     Clear to auscultation bilaterally, respirations unlabored  Heart:    Regular rate and rhythm  Neurologic:   Awake, alert, oriented x 3. No apparent focal neurological           defect.       Results for orders placed or performed in visit on 01/16/16  POC Influenza A&B(BINAX/QUICKVUE)  Result Value Ref Range   Influenza A, POC Negative Negative   Influenza B, POC Negative Negative        Assessment & Plan:      1. Upper respiratory tract infection, unspecified type   2. Cough  - POC Influenza A&B(BINAX/QUICKVUE) - HYDROcodone-homatropine (HYCODAN) 5-1.5 MG/5ML syrup; Take 5 mLs by mouth every 8 (eight) hours as needed for cough.  Dispense: 120 mL; Refill: 0  3. Sore  throat   4. Frontal sinusitis, unspecified chronicity  - azithromycin (ZITHROMAX) 250 MG tablet; 2 by mouth today, then 1 daily for 4 days  Dispense: 6 tablet; Refill: 0     The entirety of the information documented in the History of Present Illness, Review of Systems and Physical Exam were personally obtained by me. Portions of this information were initially documented by [place] CMA and reviewed by me for thoroughness and accuracy.    Lelon Huh, MD  [facility]

## 2016-01-17 ENCOUNTER — Inpatient Hospital Stay
Admission: EM | Admit: 2016-01-17 | Discharge: 2016-01-19 | DRG: 871 | Disposition: A | Discharge status: Home / Self Care | Payer: Medicare Other | Attending: Internal Medicine | Admitting: Internal Medicine

## 2016-01-17 ENCOUNTER — Encounter: Payer: Self-pay | Admitting: Emergency Medicine

## 2016-01-17 ENCOUNTER — Inpatient Hospital Stay: Payer: Medicare Other

## 2016-01-17 ENCOUNTER — Emergency Department: Payer: Medicare Other

## 2016-01-17 DIAGNOSIS — I10 Essential (primary) hypertension: Secondary | ICD-10-CM | POA: Diagnosis present

## 2016-01-17 DIAGNOSIS — R0600 Dyspnea, unspecified: Secondary | ICD-10-CM | POA: Diagnosis not present

## 2016-01-17 DIAGNOSIS — J9601 Acute respiratory failure with hypoxia: Secondary | ICD-10-CM | POA: Diagnosis present

## 2016-01-17 DIAGNOSIS — Z888 Allergy status to other drugs, medicaments and biological substances status: Secondary | ICD-10-CM

## 2016-01-17 DIAGNOSIS — I248 Other forms of acute ischemic heart disease: Secondary | ICD-10-CM | POA: Diagnosis present

## 2016-01-17 DIAGNOSIS — R0602 Shortness of breath: Secondary | ICD-10-CM | POA: Diagnosis not present

## 2016-01-17 DIAGNOSIS — R0902 Hypoxemia: Secondary | ICD-10-CM

## 2016-01-17 DIAGNOSIS — A419 Sepsis, unspecified organism: Principal | ICD-10-CM | POA: Diagnosis present

## 2016-01-17 DIAGNOSIS — J129 Viral pneumonia, unspecified: Secondary | ICD-10-CM | POA: Diagnosis present

## 2016-01-17 DIAGNOSIS — R739 Hyperglycemia, unspecified: Secondary | ICD-10-CM | POA: Diagnosis present

## 2016-01-17 DIAGNOSIS — Z79899 Other long term (current) drug therapy: Secondary | ICD-10-CM | POA: Diagnosis not present

## 2016-01-17 DIAGNOSIS — Z885 Allergy status to narcotic agent status: Secondary | ICD-10-CM

## 2016-01-17 DIAGNOSIS — J069 Acute upper respiratory infection, unspecified: Secondary | ICD-10-CM | POA: Diagnosis not present

## 2016-01-17 DIAGNOSIS — K219 Gastro-esophageal reflux disease without esophagitis: Secondary | ICD-10-CM | POA: Diagnosis present

## 2016-01-17 DIAGNOSIS — J189 Pneumonia, unspecified organism: Secondary | ICD-10-CM | POA: Diagnosis not present

## 2016-01-17 DIAGNOSIS — Z7982 Long term (current) use of aspirin: Secondary | ICD-10-CM

## 2016-01-17 DIAGNOSIS — Z87891 Personal history of nicotine dependence: Secondary | ICD-10-CM | POA: Diagnosis not present

## 2016-01-17 DIAGNOSIS — R748 Abnormal levels of other serum enzymes: Secondary | ICD-10-CM | POA: Diagnosis not present

## 2016-01-17 HISTORY — DX: Sepsis, unspecified organism: A41.9

## 2016-01-17 LAB — COMPREHENSIVE METABOLIC PANEL
ALT: 22 U/L (ref 14–54)
AST: 37 U/L (ref 15–41)
Albumin: 4 g/dL (ref 3.5–5.0)
Alkaline Phosphatase: 43 U/L (ref 38–126)
Anion gap: 10 (ref 5–15)
BUN: 16 mg/dL (ref 6–20)
CALCIUM: 9 mg/dL (ref 8.9–10.3)
CHLORIDE: 98 mmol/L — AB (ref 101–111)
CO2: 27 mmol/L (ref 22–32)
CREATININE: 1.15 mg/dL — AB (ref 0.44–1.00)
GFR, EST AFRICAN AMERICAN: 54 mL/min — AB (ref >60)
GFR, EST NON AFRICAN AMERICAN: 46 mL/min — AB (ref >60)
Glucose, Bld: 186 mg/dL — ABNORMAL HIGH (ref 65–99)
Potassium: 3.9 mmol/L (ref 3.5–5.1)
SODIUM: 135 mmol/L (ref 135–145)
Total Bilirubin: 1 mg/dL (ref 0.3–1.2)
Total Protein: 7.7 g/dL (ref 6.5–8.1)

## 2016-01-17 LAB — CBC WITH DIFFERENTIAL/PLATELET
BASOS ABS: 0.1 10*3/uL (ref 0–0.1)
Basophils Relative: 1 %
EOS ABS: 0 10*3/uL (ref 0–0.7)
EOS PCT: 0 %
HCT: 39.1 % (ref 35.0–47.0)
HEMOGLOBIN: 13.4 g/dL (ref 12.0–16.0)
LYMPHS ABS: 0.8 10*3/uL — AB (ref 1.0–3.6)
LYMPHS PCT: 5 %
MCH: 30.5 pg (ref 26.0–34.0)
MCHC: 34.3 g/dL (ref 32.0–36.0)
MCV: 89 fL (ref 80.0–100.0)
Monocytes Absolute: 1.2 10*3/uL — ABNORMAL HIGH (ref 0.2–0.9)
Monocytes Relative: 7 %
NEUTROS PCT: 87 %
Neutro Abs: 13.8 10*3/uL — ABNORMAL HIGH (ref 1.4–6.5)
PLATELETS: 411 10*3/uL (ref 150–440)
RBC: 4.4 MIL/uL (ref 3.80–5.20)
RDW: 13.9 % (ref 11.5–14.5)
WBC: 15.9 10*3/uL — AB (ref 3.6–11.0)

## 2016-01-17 LAB — CREATININE, SERUM
Creatinine, Ser: 0.96 mg/dL (ref 0.44–1.00)
GFR calc Af Amer: >60 mL/min (ref >60)
GFR calc non Af Amer: 58 mL/min — ABNORMAL LOW (ref >60)

## 2016-01-17 LAB — TROPONIN I
TROPONIN I: 0.06 ng/mL — AB (ref <0.03)
Troponin I: 0.1 ng/mL (ref <0.03)
Troponin I: 0.08 ng/mL (ref <0.03)

## 2016-01-17 LAB — URINALYSIS, COMPLETE (UACMP) WITH MICROSCOPIC
Bilirubin Urine: NEGATIVE
GLUCOSE, UA: NEGATIVE mg/dL
HGB URINE DIPSTICK: NEGATIVE
Ketones, ur: NEGATIVE mg/dL
LEUKOCYTES UA: NEGATIVE
NITRITE: NEGATIVE
Protein, ur: NEGATIVE mg/dL
SPECIFIC GRAVITY, URINE: 1.018 (ref 1.005–1.030)
pH: 5 (ref 5.0–8.0)

## 2016-01-17 LAB — CBC
HEMATOCRIT: 34.7 % — AB (ref 35.0–47.0)
Hemoglobin: 11.7 g/dL — ABNORMAL LOW (ref 12.0–16.0)
MCH: 30.3 pg (ref 26.0–34.0)
MCHC: 33.8 g/dL (ref 32.0–36.0)
MCV: 89.7 fL (ref 80.0–100.0)
PLATELETS: 294 10*3/uL (ref 150–440)
RBC: 3.87 MIL/uL (ref 3.80–5.20)
RDW: 13.7 % (ref 11.5–14.5)
WBC: 11.7 10*3/uL — ABNORMAL HIGH (ref 3.6–11.0)

## 2016-01-17 LAB — RAPID INFLUENZA A&B ANTIGENS: Influenza B (ARMC): NEGATIVE

## 2016-01-17 LAB — LACTIC ACID, PLASMA
LACTIC ACID, VENOUS: 2.8 mmol/L — AB (ref 0.5–1.9)
Lactic Acid, Venous: 1.8 mmol/L (ref 0.5–1.9)

## 2016-01-17 LAB — RAPID INFLUENZA A&B ANTIGENS (ARMC ONLY): INFLUENZA A (ARMC): NEGATIVE

## 2016-01-17 LAB — PROCALCITONIN: Procalcitonin: 1.71 ng/mL

## 2016-01-17 LAB — POCT RAPID STREP A: Streptococcus, Group A Screen (Direct): NEGATIVE

## 2016-01-17 MED ORDER — DEXTROSE 5 % IV SOLN
500.0000 mg | INTRAVENOUS | Status: DC
  Administered 2016-01-18 – 2016-01-19 (×2): 500 mg via INTRAVENOUS
  Filled 2016-01-17 (×2): qty 500

## 2016-01-17 MED ORDER — ACETAMINOPHEN 325 MG PO TABS
650.0000 mg | ORAL_TABLET | Freq: Four times a day (QID) | ORAL | Status: DC | PRN
  Administered 2016-01-17 – 2016-01-18 (×2): 650 mg via ORAL
  Filled 2016-01-17 (×2): qty 2

## 2016-01-17 MED ORDER — PSYLLIUM 95 % PO PACK
1.0000 | PACK | Freq: Every day | ORAL | Status: DC
  Administered 2016-01-17 – 2016-01-19 (×3): 1 via ORAL
  Filled 2016-01-17 (×3): qty 1

## 2016-01-17 MED ORDER — DEXTROSE 5 % IV SOLN
1.0000 g | INTRAVENOUS | Status: DC
  Administered 2016-01-18 – 2016-01-19 (×2): 1 g via INTRAVENOUS
  Filled 2016-01-17 (×2): qty 10

## 2016-01-17 MED ORDER — IOPAMIDOL (ISOVUE-370) INJECTION 76%
75.0000 mL | Freq: Once | INTRAVENOUS | Status: AC | PRN
  Administered 2016-01-17: 75 mL via INTRAVENOUS

## 2016-01-17 MED ORDER — CEFTRIAXONE SODIUM-DEXTROSE 1-3.74 GM-% IV SOLR
1.0000 g | INTRAVENOUS | Status: AC
Start: 2016-01-17 — End: 2016-01-17
  Administered 2016-01-17: 1 g via INTRAVENOUS
  Filled 2016-01-17: qty 50

## 2016-01-17 MED ORDER — DEXTROSE 5 % IV SOLN
500.0000 mg | Freq: Once | INTRAVENOUS | Status: AC
  Administered 2016-01-17: 500 mg via INTRAVENOUS
  Filled 2016-01-17 (×2): qty 500

## 2016-01-17 MED ORDER — DEXTROSE 5 % IV SOLN: 1.0000 g | Freq: Once | INTRAVENOUS | Status: DC

## 2016-01-17 MED ORDER — PHENOL 1.4 % MT LIQD
1.0000 | OROMUCOSAL | Status: DC | PRN
  Administered 2016-01-17: 1 via OROMUCOSAL
  Filled 2016-01-17 (×2): qty 177

## 2016-01-17 MED ORDER — SODIUM CHLORIDE 0.9 % IV BOLUS (SEPSIS)
500.0000 mL | Freq: Once | INTRAVENOUS | Status: AC
  Administered 2016-01-17: 500 mL via INTRAVENOUS

## 2016-01-17 MED ORDER — BISACODYL 5 MG PO TBEC: 5.0000 mg | DELAYED_RELEASE_TABLET | Freq: Every day | ORAL | Status: DC | PRN

## 2016-01-17 MED ORDER — ACETAMINOPHEN 650 MG RE SUPP: 650.0000 mg | Freq: Four times a day (QID) | RECTAL | Status: DC | PRN

## 2016-01-17 MED ORDER — ENOXAPARIN SODIUM 40 MG/0.4ML ~~LOC~~ SOLN
40.0000 mg | SUBCUTANEOUS | Status: DC
  Administered 2016-01-17 – 2016-01-18 (×2): 40 mg via SUBCUTANEOUS
  Filled 2016-01-17 (×2): qty 0.4

## 2016-01-17 MED ORDER — LATANOPROST 0.005 % OP SOLN
1.0000 [drp] | Freq: Every day | OPHTHALMIC | Status: DC
  Administered 2016-01-17 – 2016-01-18 (×2): 1 [drp] via OPHTHALMIC
  Filled 2016-01-17: qty 2.5

## 2016-01-17 MED ORDER — SENNOSIDES-DOCUSATE SODIUM 8.6-50 MG PO TABS: 1.0000 | ORAL_TABLET | Freq: Every evening | ORAL | Status: DC | PRN

## 2016-01-17 MED ORDER — ONDANSETRON HCL 4 MG PO TABS: 4.0000 mg | ORAL_TABLET | Freq: Four times a day (QID) | ORAL | Status: DC | PRN

## 2016-01-17 MED ORDER — SODIUM CHLORIDE 0.9 % IV SOLN
INTRAVENOUS | Status: DC
  Administered 2016-01-17 – 2016-01-19 (×3): via INTRAVENOUS

## 2016-01-17 MED ORDER — TIMOLOL MALEATE 0.5 % OP SOLN
1.0000 [drp] | Freq: Every day | OPHTHALMIC | Status: DC
  Administered 2016-01-17 – 2016-01-18 (×2): 1 [drp] via OPHTHALMIC
  Filled 2016-01-17: qty 5

## 2016-01-17 MED ORDER — SODIUM CHLORIDE 0.9 % IV BOLUS (SEPSIS)
1000.0000 mL | Freq: Once | INTRAVENOUS | Status: AC
  Administered 2016-01-17: 1000 mL via INTRAVENOUS

## 2016-01-17 MED ORDER — ADULT MULTIVITAMIN W/MINERALS CH
ORAL_TABLET | Freq: Every day | ORAL | Status: DC
  Administered 2016-01-17 – 2016-01-19 (×3): 1 via ORAL
  Filled 2016-01-17 (×3): qty 1

## 2016-01-17 MED ORDER — ASPIRIN EC 81 MG PO TBEC
81.0000 mg | DELAYED_RELEASE_TABLET | Freq: Every day | ORAL | Status: DC
  Administered 2016-01-17 – 2016-01-19 (×3): 81 mg via ORAL
  Filled 2016-01-17 (×3): qty 1

## 2016-01-17 MED ORDER — ONDANSETRON HCL 4 MG/2ML IJ SOLN: 4.0000 mg | Freq: Four times a day (QID) | INTRAMUSCULAR | Status: DC | PRN

## 2016-01-17 MED ORDER — SODIUM CHLORIDE 0.9% FLUSH
3.0000 mL | Freq: Two times a day (BID) | INTRAVENOUS | Status: DC
  Administered 2016-01-17 – 2016-01-18 (×3): 3 mL via INTRAVENOUS

## 2016-01-17 MED ORDER — FIBER PO POWD: 2.0000 | Freq: Every day | ORAL | Status: DC

## 2016-01-17 MED ORDER — PANTOPRAZOLE SODIUM 40 MG PO TBEC
80.0000 mg | DELAYED_RELEASE_TABLET | Freq: Every day | ORAL | Status: DC
  Administered 2016-01-17 – 2016-01-19 (×3): 80 mg via ORAL
  Filled 2016-01-17 (×3): qty 2

## 2016-01-17 MED ORDER — DIAZEPAM 5 MG PO TABS
5.0000 mg | ORAL_TABLET | Freq: Four times a day (QID) | ORAL | Status: DC | PRN
  Administered 2016-01-17 – 2016-01-19 (×4): 5 mg via ORAL
  Filled 2016-01-17 (×4): qty 1

## 2016-01-17 NOTE — Progress Notes (Signed)
Family Meeting Note  Advance Directive:no  Today a meeting took place with the Patient.and spouse    The following clinical team members were present during this meeting:MD  The following were discussed:Patient's diagnosis: Sepsis with hypoxia , Patient's progosis: > 12 months and Goals for treatment: Full Code  Additional follow-up to be provided: Patient and husband will work on advanced directives/living will. The service can be offered while in the hospital  Time spent during discussion:20 minutes  Joan Baldwin, Ulice Bold, MD

## 2016-01-17 NOTE — Consult Note (Signed)
Pharmacy Antibiotic Note  Joan Baldwin is a 70 y.o. female admitted on 01/17/2016 with possible PNA.  Pharmacy has been consulted for azithromycin and ceftriaxoane dosing.  Plan: azithromycin 500mg  q 24 hr, ceftriaxone 1g q 24hr  Height: 5\' 6"  (167.6 cm) Weight: 172 lb (78 kg) IBW/kg (Calculated) : 59.3  Temp (24hrs), Avg:101.3 F (38.5 C), Min:99.6 F (37.6 C), Max:103 F (39.4 C)   Recent Labs Lab 01/17/16 1127  WBC 15.9*  CREATININE 1.15*  LATICACIDVEN 1.8    Estimated Creatinine Clearance: 46.6 mL/min (by C-G formula based on SCr of 1.15 mg/dL (H)).    Allergies  Allergen Reactions  . Codeine Itching  . Ultram  [Tramadol Hcl]     Insomnia    Antimicrobials this admission: azithromycin 1/6 >>  ceftriaxone 1/6 >>   Dose adjustments this admission:   Microbiology results: 1/6 BCx:  1/6 UCx:   1/6 PCT 1/6 flu  Thank you for allowing pharmacy to be a part of this patient's care.  Ramond Dial, Pharm.D, BCPS Clinical Pharmacist  01/17/2016 1:52 PM

## 2016-01-17 NOTE — ED Provider Notes (Signed)
[facility] Emergency Department Provider Note   ____________________________________________   I have reviewed the triage vital signs and the nursing notes.   HISTORY  Chief Complaint Respiratory Distress   History limited by: Not Limited   HPI Joan Baldwin is a 71 y.o. female who presents to the emergency department today because of shortness of breath. Patient has been feeling short of breath for the past few days. She went and saw primary care doctor yesterday and diagnosed with an upper respiratory infection and prescribed her azithromycin. Since then however the shortness breath has persisted and increased. When EMS arrived at her house her oxygen saturation was in the low 70s. EMS put the patient on CPAP. The patient has had some associated cough.   Past Medical History:  Diagnosis Date  . History of chicken pox     Patient Active Problem List   Diagnosis Date Noted  . Glaucoma 11/22/2014  . Skin lesion 11/22/2014  . Osteoporosis 02/13/2013  . Pre-diabetes 08/10/2009  . Essential (primary) hypertension 05/06/2006  . Anxiety 01/11/2001  . Insomnia 01/11/2001  . GERD (gastroesophageal reflux disease) 01/11/2001    Past Surgical History:  Procedure Laterality Date  . ABDOMINAL HYSTERECTOMY     vaginal. Still has both ovaries  . back surgeries     x 2  . COLONOSCOPY  2007   done in [place] per patient. Normal except for moderate hemorrhoids  . MOUTH SURGERY  2011  . STOMACH SURGERY     staples for obesity  . Tailbone surgery      Prior to Admission medications   Medication Sig Start Date End Date Taking? Authorizing Provider  aspirin 81 MG tablet Take 1 tablet by mouth daily.    Historical Provider, MD  azithromycin (ZITHROMAX) 250 MG tablet 2 by mouth today, then 1 daily for 4 days 01/16/16 01/21/16  Birdie Sons, MD  Calcium Carbonate-Vitamin D (CALCIUM 600/VITAMIN D PO) Take 1 tablet by mouth 2 (two) times daily.     Historical Provider, MD  diazepam (VALIUM) 5 MG tablet Take 1 tablet (5 mg total) by mouth every 6 (six) hours as needed. for anxiety 11/24/15   Birdie Sons, MD  Fiber POWD Take 2 scoop by mouth daily.    Historical Provider, MD  hydrochlorothiazide (HYDRODIURIL) 25 MG tablet TAKE ONE TABLET BY MOUTH ONCE DAILY 12/30/15   Birdie Sons, MD  HYDROcodone-homatropine [facility]) 5-1.5 MG/5ML syrup Take 5 mLs by mouth every 8 (eight) hours as needed for cough. 01/16/16   Birdie Sons, MD  latanoprost (XALATAN) 0.005 % ophthalmic solution Place 1 drop into both eyes at bedtime.    Historical Provider, MD  lisinopril (PRINIVIL,ZESTRIL) 10 MG tablet TAKE ONE-HALF TABLET BY MOUTH ONCE DAILY 05/05/15   Birdie Sons, MD  Multiple Vitamins-Minerals (MULTIVITAMIN ADULT PO) Take 1 tablet by mouth daily.    Historical Provider, MD  Omega-3 Fatty Acids (FISH OIL) 1000 MG CAPS Take 1 capsule by mouth 2 (two) times daily. 08/04/09   Historical Provider, MD  omeprazole (PRILOSEC) 20 MG capsule TAKE ONE CAPSULE BY MOUTH ONCE DAILY 10/30/15   Birdie Sons, MD  Timolol Maleate 0.5 % (DAILY) SOLN Place 1 drop into both eyes at bedtime.    Historical Provider, MD    Allergies Codeine and Ultram  [tramadol hcl]  Family History  Problem Relation Age of Onset  . Emphysema Mother   . Kidney failure Sister   . Heart Problems Brother   .  Diabetes Brother     Social History Social History  Substance Use Topics  . Smoking status: Former Smoker    Packs/day: 1.00    Years: 30.00    Types: Cigarettes    Quit date: 01/11/2001  . Smokeless tobacco: Never Used  . Alcohol use No    Review of Systems  Constitutional: Negative for fever. Cardiovascular: Negative for chest pain. Respiratory: Positive for shortness of breath. Gastrointestinal: Negative for abdominal pain, vomiting and diarrhea. Neurological: Negative for headaches, focal weakness or numbness.  10-point ROS otherwise  negative.  ____________________________________________   PHYSICAL EXAM:  VITAL SIGNS: ED Triage Vitals  Enc Vitals Group     BP 105/50     Pulse 131     Resp 23     Temp 103     Temp src      SpO2 90     Weight    Constitutional: Awake, alert, oriented. In moderate respiratory distress, on CPAP. Eyes: Conjunctivae are normal. Normal extraocular movements. ENT   Head: Normocephalic and atraumatic.   Nose: No congestion/rhinnorhea.   Mouth/Throat: Mucous membranes are moist.   Neck: No stridor. Hematological/Lymphatic/Immunilogical: No cervical lymphadenopathy. Cardiovascular: Tachycardic regular rhythm.  No murmurs, rubs, or gallops.  Respiratory: Increased respiratory effort. Tachypnea. Diffuse rhonchi. Gastrointestinal: Soft and non tender. No rebound. No guarding.  Genitourinary: Deferred Musculoskeletal: Normal range of motion in all extremities. No lower extremity edema. Neurologic:  Normal speech and language. No gross focal neurologic deficits are appreciated.  Skin:  Skin is warm, dry and intact. No rash noted. Psychiatric: Mood and affect are normal. Speech and behavior are normal. Patient exhibits appropriate insight and judgment.  ____________________________________________    LABS (pertinent positives/negatives)  Labs Reviewed  COMPREHENSIVE METABOLIC PANEL - Abnormal; Notable for the following:       Result Value   Chloride 98 (*)    Glucose, Bld 186 (*)    Creatinine, Ser 1.15 (*)    GFR calc non Af Amer 46 (*)    GFR calc Af Amer 54 (*)    All other components within normal limits  TROPONIN I - Abnormal; Notable for the following:    Troponin I 0.06 (*)    All other components within normal limits  CBC WITH DIFFERENTIAL/PLATELET - Abnormal; Notable for the following:    WBC 15.9 (*)    Neutro Abs 13.8 (*)    Lymphs Abs 0.8 (*)    Monocytes Absolute 1.2 (*)    All other components within normal limits  RAPID INFLUENZA A&B ANTIGENS  ([facility] ONLY)  CULTURE, BLOOD (ROUTINE X 2)  CULTURE, BLOOD (ROUTINE X 2)  URINE CULTURE  CULTURE, GROUP A STREP ([place])  LACTIC ACID, PLASMA  LACTIC ACID, PLASMA  URINALYSIS, COMPLETE (UACMP) WITH MICROSCOPIC  PROCALCITONIN     ____________________________________________   EKG  I, Nance Pear, attending physician, personally viewed and interpreted this EKG  EKG Time: 1114 Rate: 129 Rhythm: sinus tachycardia Axis: normal Intervals: qtc 444 QRS: narrow ST changes: no st elevation Impression: abnormal ekg   ____________________________________________    RADIOLOGY  CXR  IMPRESSION: No acute cardiopulmonary disease.  I, Layn Kye, personally viewed and evaluated these images (plain radiographs on x-ray machine) as part of my medical decision making. ____________________________________________   PROCEDURES  Procedures  CRITICAL CARE Performed by: Nance Pear   Total critical care time: 30 minutes  Critical care time was exclusive of separately billable procedures and treating other patients.  Critical care was necessary to  treat or prevent imminent or life-threatening deterioration.  Critical care was time spent personally by me on the following activities: development of treatment plan with patient and/or surrogate as well as nursing, discussions with consultants, evaluation of patient's response to treatment, examination of patient, obtaining history from patient or surrogate, ordering and performing treatments and interventions, ordering and review of laboratory studies, ordering and review of radiographic studies, pulse oximetry and re-evaluation of patient's condition.  ____________________________________________   INITIAL IMPRESSION / ASSESSMENT AND PLAN / ED COURSE  Pertinent labs & imaging results that were available during my care of the patient were reviewed by me and considered in my medical decision making (see chart for  details).  Patient presented to the emergency department today because of concerns for shortness of breath. Patient was initially quite hypoxic for EMSon CPAP. Here the patient was febrile and tachycardic. The patient was called a code sepsis. The patient was placed on multiple broad-spectrum antibiotics and was given IV fluids. The obvious pneumonia. Influenza was negative. Will plan on admission to the hospital service for further workup and evaluation.  ____________________________________________   FINAL CLINICAL IMPRESSION(S) / ED DIAGNOSES  Final diagnoses:  Hypoxia     Note: This dictation was prepared with Dragon dictation. Any transcriptional errors that result from this process are unintentional     Nance Pear, MD 01/17/16 1452

## 2016-01-17 NOTE — ED Notes (Signed)
0.06 troponin; notified Dr. Archie Balboa

## 2016-01-17 NOTE — ED Triage Notes (Signed)
Pt via ems from home on cpap. Pt O2 sat 72% upon EMS arrival. Pt seen at PCP office yesterday for URI; given zithro and hydrocet. No improvement today; EMS reports pt felt worse today, with SOB on exertion, as well as lethargy. Pt tachycardic upon arrival.

## 2016-01-17 NOTE — H&P (Signed)
[place] [facility] [place] NAME: Joan Baldwin    MR#:  [id number]  DATE OF BIRTH:  [birth year]  DATE OF ADMISSION:  01/17/2016  PRIMARY CARE PHYSICIAN: Lelon Huh, MD   REQUESTING/REFERRING PHYSICIAN: Dr. Archie Balboa CHIEF COMPLAINT:  Shortness of breath and lethargic  HISTORY OF PRESENT ILLNESS:  Joan Baldwin  is a 71 y.o. female with a known history of Hypertension who was evaluated by her PCP yesterday due to shortness of breath, cough and upper respiratory infection. She was given a prescription for azithromycin and went home. Over the course of the past 12 hours she has had increasing lethargy, cough and shortness of breath. Her chest x-ray in the emergency room did not show evidence of pneumonia. She has had a fever of 103 while in the ER.  She also reports that her husband has been recently diagnosed with pneumonia. She is also reporting a sore throat. She denies urinary symptoms such as dysuria, frequency or urgency. She denies abdominal pain.  Influenza A and B were negative today in the ER. PAST MEDICAL HISTORY:   Past Medical History:  Diagnosis Date  . History of chicken pox   . Hypertension     PAST SURGICAL HISTORY:   Past Surgical History:  Procedure Laterality Date  . ABDOMINAL HYSTERECTOMY     vaginal. Still has both ovaries  . back surgeries     x 2  . COLONOSCOPY  2007   done in [place] per patient. Normal except for moderate hemorrhoids  . MOUTH SURGERY  2011  . STOMACH SURGERY     staples for obesity  . Tailbone surgery      SOCIAL HISTORY:   Social History  Substance Use Topics  . Smoking status: Former Smoker    Packs/day: 1.00    Years: 30.00    Types: Cigarettes    Quit date: 01/11/2001  . Smokeless tobacco: Never Used  . Alcohol use No    FAMILY HISTORY:   Family History  Problem Relation Age of Onset  . Emphysema Mother   . Kidney failure Sister   . Heart Problems Brother   . Diabetes  Brother     DRUG ALLERGIES:   Allergies  Allergen Reactions  . Codeine Itching  . Ultram  [Tramadol Hcl]     Insomnia    REVIEW OF SYSTEMS:   Review of Systems  Constitutional: Positive for fever. Negative for chills and malaise/fatigue.  HENT: Positive for sore throat. Negative for ear discharge, ear pain, hearing loss and nosebleeds.   Eyes: Negative.  Negative for blurred vision and pain.  Respiratory: Positive for cough and shortness of breath. Negative for hemoptysis and wheezing.   Cardiovascular: Negative.  Negative for chest pain, palpitations and leg swelling.  Gastrointestinal: Negative.  Negative for abdominal pain, blood in stool, diarrhea, nausea and vomiting.  Genitourinary: Negative.  Negative for dysuria.  Musculoskeletal: Negative.  Negative for back pain.  Skin: Negative.   Neurological: Positive for weakness. Negative for dizziness, tremors, speech change, focal weakness, seizures and headaches.  Endo/Heme/Allergies: Negative.  Does not bruise/bleed easily.  Psychiatric/Behavioral: Negative.  Negative for depression, hallucinations and suicidal ideas.    MEDICATIONS AT HOME:   Prior to Admission medications   Medication Sig Start Date End Date Taking? Authorizing Provider  aspirin 81 MG tablet Take 1 tablet by mouth daily.   Yes Historical Provider, MD  azithromycin (ZITHROMAX) 250 MG tablet 2 by mouth today, then 1 daily  for 4 days Patient taking differently: Take 250 mg by mouth daily. 2 by mouth today, then 1 daily for 4 days 01/16/16 01/21/16 Yes Birdie Sons, MD  Calcium Carbonate-Vitamin D (CALCIUM 600/VITAMIN D PO) Take 1 tablet by mouth 2 (two) times daily.   Yes Historical Provider, MD  diazepam (VALIUM) 5 MG tablet Take 1 tablet (5 mg total) by mouth every 6 (six) hours as needed. for anxiety 11/24/15  Yes Birdie Sons, MD  Fiber POWD Take 2 scoop by mouth daily.   Yes Historical Provider, MD  hydrochlorothiazide (HYDRODIURIL) 25 MG tablet TAKE  ONE TABLET BY MOUTH ONCE DAILY 12/30/15  Yes Birdie Sons, MD  HYDROcodone-homatropine [facility]) 5-1.5 MG/5ML syrup Take 5 mLs by mouth every 8 (eight) hours as needed for cough. 01/16/16  Yes Birdie Sons, MD  latanoprost (XALATAN) 0.005 % ophthalmic solution Place 1 drop into both eyes at bedtime.   Yes Historical Provider, MD  lisinopril (PRINIVIL,ZESTRIL) 10 MG tablet TAKE ONE-HALF TABLET BY MOUTH ONCE DAILY 05/05/15  Yes Birdie Sons, MD  Multiple Vitamins-Minerals (MULTIVITAMIN ADULT PO) Take 1 tablet by mouth daily.   Yes Historical Provider, MD  Omega-3 Fatty Acids (FISH OIL) 1000 MG CAPS Take 1 capsule by mouth 2 (two) times daily. 08/04/09  Yes Historical Provider, MD  omeprazole (PRILOSEC) 20 MG capsule TAKE ONE CAPSULE BY MOUTH ONCE DAILY 10/30/15  Yes Birdie Sons, MD  Timolol Maleate 0.5 % (DAILY) SOLN Place 1 drop into both eyes at bedtime.   Yes Historical Provider, MD      VITAL SIGNS:  Blood pressure 113/67, pulse (!) 112, temperature 99.6 F (37.6 C), temperature source Oral, resp. rate 20, height 5\' 6"  (1.676 m), weight 78 kg (172 lb), SpO2 97 %.  PHYSICAL EXAMINATION:   Physical Exam  Constitutional: She is oriented to person, place, and time and well-developed, well-nourished, and in no distress. No distress.  HENT:  Head: Normocephalic.  Eyes: No scleral icterus.  Neck: Normal range of motion. Neck supple. No JVD present. No tracheal deviation present.  Cardiovascular: Normal rate, regular rhythm and normal heart sounds.  Exam reveals no gallop and no friction rub.   No murmur heard. Pulmonary/Chest: Effort normal and breath sounds normal. No respiratory distress. She has no wheezes. She has no rales. She exhibits no tenderness.  Abdominal: Soft. Bowel sounds are normal. She exhibits no distension and no mass. There is no tenderness. There is no rebound and no guarding.  Musculoskeletal: Normal range of motion. She exhibits no edema.  Neurological: She is  alert and oriented to person, place, and time.  Skin: Skin is warm. No rash noted. No erythema.  Psychiatric: Affect and judgment normal.      LABORATORY PANEL:   CBC  Recent Labs Lab 01/17/16 1127  WBC 15.9*  HGB 13.4  HCT 39.1  PLT 411   ------------------------------------------------------------------------------------------------------------------  Chemistries   Recent Labs Lab 01/17/16 1127  NA 135  K 3.9  CL 98*  CO2 27  GLUCOSE 186*  BUN 16  CREATININE 1.15*  CALCIUM 9.0  AST 37  ALT 22  ALKPHOS 43  BILITOT 1.0   ------------------------------------------------------------------------------------------------------------------  Cardiac Enzymes  Recent Labs Lab 01/17/16 1127  TROPONINI 0.06*   ------------------------------------------------------------------------------------------------------------------  RADIOLOGY:  Dg Chest Port 1 View  Result Date: 01/17/2016 CLINICAL DATA:  Pt via ems from home on cpap. Pt O2 sat 72% upon EMS arrival. Pt seen at PCP office yesterday for URI; given zithro and  hydrocet. No improvement today; EMS reports pt felt worse today, with SOB on exertion, as well as lethargy. Pt tachycardic upon arrival. Former smoker. EXAM: PORTABLE CHEST 1 VIEW COMPARISON:  None FINDINGS: Cardiac silhouette is normal in size. No mediastinal or hilar masses. No evidence of adenopathy. Lungs are clear.  No convincing pleural effusion.  No pneumothorax. Skeletal structures are demineralized but grossly intact. IMPRESSION: No acute cardiopulmonary disease. Electronically Signed   By: Lajean Manes M.D.   On: 01/17/2016 11:32    EKG:   Sinus tachycardia heart rate 129 no ST elevation or depression  IMPRESSION AND PLAN:   71 year old female with history of hypertension who presents with sepsis.  1. Sepsis due to likely community-acquired pneumonia: Patient presents with fever, tachypnea, tachycardia and leukocytosis. Continue Rocephin and  azithromycin as started in the ER. Follow-up on final urine and blood cultures  2. Acute hypoxic respiratory failure: I will order CT chest to evaluate for pulmonary emboli and better evaluate hypoxia since chest x-ray does not show pneumonia.  3. Elevated troponin in the setting of supply demand ischemia with sepsis Follow troponins  4. Essential hypertension: Due to sepsis and low/normal blood pressure I will hold blood pressure medications for today and reevaluate in a.m.  5. GERD on PPI  6. Hyperglycemia: Recheck BMP in a.m.    All the records are reviewed and case discussed with ED provider. Management plans discussed with the patient and husband and she is  in agreement  CODE STATUS: FULL  TOTAL TIME TAKING CARE OF THIS PATIENT: 45 minutes.    Nikolaj Geraghty M.D on 01/17/2016 at 2:05 PM  Between 7am to 6pm - Pager - [phone]  After 6pm go to [email or web address] - password [email or web address] [facility]  Office  [phone]  CC: Primary care physician; Lelon Huh, MD

## 2016-01-17 NOTE — ED Notes (Signed)
Pt removed from cpap per dr. Archie Balboa; O2 sats 91-92% on room air; pt placed on [place] 1L.

## 2016-01-18 LAB — CBC
HEMATOCRIT: 32.9 % — AB (ref 35.0–47.0)
HEMOGLOBIN: 11.4 g/dL — AB (ref 12.0–16.0)
MCH: 31.4 pg (ref 26.0–34.0)
MCHC: 34.7 g/dL (ref 32.0–36.0)
MCV: 90.4 fL (ref 80.0–100.0)
Platelets: 258 10*3/uL (ref 150–440)
RBC: 3.64 MIL/uL — AB (ref 3.80–5.20)
RDW: 13.9 % (ref 11.5–14.5)
WBC: 10 10*3/uL (ref 3.6–11.0)

## 2016-01-18 LAB — BASIC METABOLIC PANEL
ANION GAP: 5 (ref 5–15)
BUN: 12 mg/dL (ref 6–20)
CALCIUM: 8.6 mg/dL — AB (ref 8.9–10.3)
CO2: 25 mmol/L (ref 22–32)
Chloride: 108 mmol/L (ref 101–111)
Creatinine, Ser: 0.74 mg/dL (ref 0.44–1.00)
GFR calc non Af Amer: >60 mL/min (ref >60)
Glucose, Bld: 106 mg/dL — ABNORMAL HIGH (ref 65–99)
POTASSIUM: 3.6 mmol/L (ref 3.5–5.1)
Sodium: 138 mmol/L (ref 135–145)

## 2016-01-18 LAB — URINE CULTURE: CULTURE: NO GROWTH

## 2016-01-18 LAB — TROPONIN I: Troponin I: 0.06 ng/mL (ref <0.03)

## 2016-01-18 MED ORDER — GUAIFENESIN-DM 100-10 MG/5ML PO SYRP
10.0000 mL | ORAL_SOLUTION | ORAL | Status: DC | PRN
Start: 2016-01-18 — End: 2016-01-19
  Administered 2016-01-18: 10 mL via ORAL
  Filled 2016-01-18 (×2): qty 10

## 2016-01-18 MED ORDER — HYDROCHLOROTHIAZIDE 25 MG PO TABS
25.0000 mg | ORAL_TABLET | Freq: Every day | ORAL | Status: DC
  Administered 2016-01-18 – 2016-01-19 (×2): 25 mg via ORAL
  Filled 2016-01-18 (×2): qty 1

## 2016-01-18 MED ORDER — LISINOPRIL 5 MG PO TABS
5.0000 mg | ORAL_TABLET | Freq: Every day | ORAL | Status: DC
  Administered 2016-01-18 – 2016-01-19 (×2): 5 mg via ORAL
  Filled 2016-01-18 (×2): qty 1

## 2016-01-18 NOTE — Plan of Care (Signed)
Home blood pressure medications were ordered per MD request.  Patient stated that she takes Hydrocholorothiazide 25mg  daily and Lisinopril one half of a 10mg  tablet daily.

## 2016-01-18 NOTE — Progress Notes (Signed)
[facility] [place] NAME: Joan Baldwin    MR#:  [id number]  DATE OF BIRTH:  1946-01-03  SUBJECTIVE:   Came in with increasing dry cough fever and chills found to have multifocal pneumonia. Physical better although very weak REVIEW OF SYSTEMS:   Review of Systems  Constitutional: Positive for chills and fever. Negative for weight loss.  HENT: Negative for ear discharge, ear pain and nosebleeds.   Eyes: Negative for blurred vision, pain and discharge.  Respiratory: Positive for cough and shortness of breath. Negative for wheezing and stridor.   Cardiovascular: Negative for chest pain, palpitations, orthopnea and PND.  Gastrointestinal: Negative for abdominal pain, diarrhea, nausea and vomiting.  Genitourinary: Negative for frequency and urgency.  Musculoskeletal: Negative for back pain and joint pain.  Neurological: Positive for weakness. Negative for sensory change, speech change and focal weakness.  Psychiatric/Behavioral: Negative for depression and hallucinations. The patient is not nervous/anxious.    Tolerating Diet:yes Tolerating PT: Pending  DRUG ALLERGIES:   Allergies  Allergen Reactions  . Codeine Itching  . Ultram  [Tramadol Hcl]     Insomnia    VITALS:  Blood pressure (!) 141/61, pulse 91, temperature 98.9 F (37.2 C), temperature source Oral, resp. rate 16, height 5\' 6"  (1.676 m), weight 78 kg (172 lb), SpO2 98 %.  PHYSICAL EXAMINATION:   Physical Exam  GENERAL:  71 y.o.-year-old patient lying in the bed with no acute distress.  EYES: Pupils equal, round, reactive to light and accommodation. No scleral icterus. Extraocular muscles intact.  HEENT: Head atraumatic, normocephalic. Oropharynx and nasopharynx clear.  NECK:  Supple, no jugular venous distention. No thyroid enlargement, no tenderness.  LUNGS: Normal breath sounds bilaterally, no wheezing, rales, rhonchi. No use of accessory muscles of  respiration.  CARDIOVASCULAR: S1, S2 normal. No murmurs, rubs, or gallops.  ABDOMEN: Soft, nontender, nondistended. Bowel sounds present. No organomegaly or mass.  EXTREMITIES: No cyanosis, clubbing or edema b/l.    NEUROLOGIC: Cranial nerves II through XII are intact. No focal Motor or sensory deficits b/l.   PSYCHIATRIC:  patient is alert and oriented x 3.  SKIN: No obvious rash, lesion, or ulcer.   LABORATORY PANEL:  CBC  Recent Labs Lab 01/18/16 0453  WBC 10.0  HGB 11.4*  HCT 32.9*  PLT 258    Chemistries   Recent Labs Lab 01/17/16 1127  01/18/16 0453  NA 135  --  138  K 3.9  --  3.6  CL 98*  --  108  CO2 27  --  25  GLUCOSE 186*  --  106*  BUN 16  --  12  CREATININE 1.15*  < > 0.74  CALCIUM 9.0  --  8.6*  AST 37  --   --   ALT 22  --   --   ALKPHOS 43  --   --   BILITOT 1.0  --   --   < > = values in this interval not displayed. Cardiac Enzymes  Recent Labs Lab 01/18/16 0453  TROPONINI 0.06*   RADIOLOGY:  Ct Angio Chest Pe W Or Wo Contrast  Result Date: 01/17/2016 CLINICAL DATA:  71 year old female with hypoxia and recent diagnosis of upper respiratory infection EXAM: CT ANGIOGRAPHY CHEST WITH CONTRAST TECHNIQUE: Multidetector CT imaging of the chest was performed using the standard protocol during bolus administration of intravenous contrast. Multiplanar CT image reconstructions and MIPs were obtained to evaluate the vascular anatomy. CONTRAST:  75 mL  Isovue 370 COMPARISON:  Prior chest x-ray 01/17/2016 FINDINGS: Cardiovascular: Adequate opacification of the pulmonary arteries to the proximal subsegmental level. No evidence of acute pulmonary embolus. Conventional 3 vessel arch anatomy. The aorta is normal in size. No dissection. The heart is normal in size. No pericardial effusion. Trace calcifications along the left anterior descending coronary artery. Mediastinum/Nodes: Diffusely patulous thoracic esophagus. Surgical changes suggest prior gastric bypass  operation. No suspicious mediastinal mass or adenopathy. Lungs/Pleura: Multifocal patchy ground-glass attenuation airspace opacities noted in the right upper, bilateral lower and right middle lobe. There is relative sparing of the left upper lobe. The appearance is most suggestive of a multifocal infectious/ inflammatory process. There is also diffuse mild bronchial wall thickening. The no pneumothorax or pleural effusion. Upper Abdomen: Metallic radiopacity noted anterior to the spleen. This is of uncertain etiology. Otherwise, the visualized upper abdomen is unremarkable. Musculoskeletal: No acute fracture or aggressive appearing lytic or blastic osseous lesion. Review of the MIP images confirms the above findings. IMPRESSION: 1. The combination of diffuse mild bronchial wall thickening with patchy ground-glass attenuation airspace opacities in a peribronchovascular distribution involving the right upper, right middle and bilateral lower lobes is most consistent with a multifocal infectious/inflammatory process such as atypical or viral pneumonia. Multifocal bronchopneumonia is also a possibility. 2. Negative for evidence of acute pulmonary embolus. 3. Trace calcifications along the left anterior descending coronary artery. 4. Diffusely dilated and patulous thoracic esophagus. Does the patient have a history of dysphagia, gastrointestinal reflux disease or distal esophageal stricture? 5. Surgical changes of prior gastric bypass surgery. Electronically Signed   By: Jacqulynn Cadet M.D.   On: 01/17/2016 14:21   Dg Chest Port 1 View  Result Date: 01/17/2016 CLINICAL DATA:  Pt via ems from home on cpap. Pt O2 sat 72% upon EMS arrival. Pt seen at PCP office yesterday for URI; given zithro and hydrocet. No improvement today; EMS reports pt felt worse today, with SOB on exertion, as well as lethargy. Pt tachycardic upon arrival. Former smoker. EXAM: PORTABLE CHEST 1 VIEW COMPARISON:  None FINDINGS: Cardiac  silhouette is normal in size. No mediastinal or hilar masses. No evidence of adenopathy. Lungs are clear.  No convincing pleural effusion.  No pneumothorax. Skeletal structures are demineralized but grossly intact. IMPRESSION: No acute cardiopulmonary disease. Electronically Signed   By: Lajean Manes M.D.   On: 01/17/2016 11:32   ASSESSMENT AND PLAN:  71 year old female with history of hypertension who presents with sepsis.  1. Sepsis due to likely community-acquired pneumonia: Patient presents with fever, tachypnea, tachycardia and leukocytosis. Continue Rocephin and azithromycin Blood cultures negative so far. Influenza test negative.  2. Acute hypoxic respiratory failure:  -Sats 96 on room air. CT chest negative for PE shows multifocal pneumonia  3. Elevated troponin in the setting of supply demand ischemia with sepsis Appears demand ischemia in the setting of sepsis and pneumonia. No history of CAD  4. Essential hypertension: Due to sepsis and low/normal blood pressure I will hold blood pressure medications for today and reevaluate in a.m.  5. GERD on PPI   Case discussed with Care Management/Social Worker. Management plans discussed with the patient, family and they are in agreement.  CODE STATUS: Full DVT Prophylaxis: *Lovenox  TOTAL TIME TAKING CARE OF THIS PATIENT: 50 minutes.  >50% time spent on counselling and coordination of care  POSSIBLE D/C IN one to 2 DAYS, DEPENDING ON CLINICAL CONDITION.  Note: This dictation was prepared with Dragon dictation along with smaller phrase technology.  Any transcriptional errors that result from this process are unintentional.  Hendrixx Severin M.D on 01/18/2016 at 12:13 PM  Between 7am to 6pm - Pager - [phone]  After 6pm go to [email or web address] - password [email or web address] [facility]  Office  [phone]  CC: Primary care physician; Lelon Huh, MD

## 2016-01-19 ENCOUNTER — Telehealth: Payer: Self-pay | Admitting: Family Medicine

## 2016-01-19 LAB — PROCALCITONIN: Procalcitonin: 0.98 ng/mL

## 2016-01-19 MED ORDER — CEFUROXIME AXETIL 500 MG PO TABS: 500.0000 mg | ORAL_TABLET | Freq: Two times a day (BID) | ORAL | 0 refills | Status: DC

## 2016-01-19 MED ORDER — AZITHROMYCIN 250 MG PO TABS
250.0000 mg | ORAL_TABLET | Freq: Every day | ORAL | Status: DC
  Administered 2016-01-19: 250 mg via ORAL
  Filled 2016-01-19: qty 1

## 2016-01-19 MED ORDER — CEFUROXIME AXETIL 500 MG PO TABS: 500.0000 mg | ORAL_TABLET | Freq: Two times a day (BID) | ORAL | Status: DC

## 2016-01-19 NOTE — Discharge Summary (Signed)
[facility] [place] NAME: Joan Baldwin    MR#:  [id number]  DATE OF BIRTH:  1945/05/24  DATE OF ADMISSION:  01/17/2016 ADMITTING PHYSICIAN: Bettey Costa, MD  DATE OF DISCHARGE: 01/19/16  PRIMARY CARE PHYSICIAN: Lelon Huh, MD    ADMISSION DIAGNOSIS:  Hypoxia [R09.02]  DISCHARGE DIAGNOSIS:  Sepsis due to Multifocal Pneumonia  SECONDARY DIAGNOSIS:   Past Medical History:  Diagnosis Date  . History of chicken pox   . Hypertension     HOSPITAL COURSE:  71 year old female with history of hypertension who presents with sepsis.  1. Sepsis due to likely community-acquired pneumonia: Patient presents with fever, tachypnea, tachycardia and leukocytosis. Continue Rocephin and azithromycin---change to oral abxs Blood cultures negative so far. Influenza test negative.  2. Acute hypoxic respiratory failure:  -Sats 96 on room air. CT chest negative for PE shows multifocal pneumonia  3. Elevated troponin in the setting of supply demand ischemia with sepsis Appears demand ischemia in the setting of sepsis and pneumonia. No history of CAD  4. Essential hypertension: Due to sepsis and low/normal blood pressure I will hold blood pressure medications for today and reevaluate in a.m.  5. GERD on PPI  Overall better. No fever, BC neg D/c home  CONSULTS OBTAINED:    DRUG ALLERGIES:   Allergies  Allergen Reactions  . Codeine Itching  . Ultram  [Tramadol Hcl]     Insomnia    DISCHARGE MEDICATIONS:   Current Discharge Medication List    START taking these medications   Details  cefUROXime (CEFTIN) 500 MG tablet Take 1 tablet (500 mg total) by mouth 2 (two) times daily with a meal. Qty: 12 tablet, Refills: 0      CONTINUE these medications which have NOT CHANGED   Details  aspirin 81 MG tablet Take 1 tablet by mouth daily.    azithromycin (ZITHROMAX) 250 MG tablet 2 by mouth today, then 1 daily for 4 days Qty: 6  tablet, Refills: 0   Associated Diagnoses: Frontal sinusitis, unspecified chronicity    Calcium Carbonate-Vitamin D (CALCIUM 600/VITAMIN D PO) Take 1 tablet by mouth 2 (two) times daily.    diazepam (VALIUM) 5 MG tablet Take 1 tablet (5 mg total) by mouth every 6 (six) hours as needed. for anxiety Qty: 120 tablet, Refills: 5   Associated Diagnoses: Anxiety    Fiber POWD Take 2 scoop by mouth daily.    hydrochlorothiazide (HYDRODIURIL) 25 MG tablet TAKE ONE TABLET BY MOUTH ONCE DAILY Qty: 30 tablet, Refills: 12    HYDROcodone-homatropine (HYCODAN) 5-1.5 MG/5ML syrup Take 5 mLs by mouth every 8 (eight) hours as needed for cough. Qty: 120 mL, Refills: 0   Associated Diagnoses: Cough    latanoprost (XALATAN) 0.005 % ophthalmic solution Place 1 drop into both eyes at bedtime.    lisinopril (PRINIVIL,ZESTRIL) 10 MG tablet TAKE ONE-HALF TABLET BY MOUTH ONCE DAILY Qty: 30 tablet, Refills: 12    Multiple Vitamins-Minerals (MULTIVITAMIN ADULT PO) Take 1 tablet by mouth daily.    Omega-3 Fatty Acids (FISH OIL) 1000 MG CAPS Take 1 capsule by mouth 2 (two) times daily.    omeprazole (PRILOSEC) 20 MG capsule TAKE ONE CAPSULE BY MOUTH ONCE DAILY Qty: 30 capsule, Refills: 12    Timolol Maleate 0.5 % (DAILY) SOLN Place 1 drop into both eyes at bedtime.        If you experience worsening of your admission symptoms, develop shortness of breath, life threatening emergency, suicidal  or homicidal thoughts you must seek medical attention immediately by calling 911 or calling your MD immediately  if symptoms less severe.  You Must read complete instructions/literature along with all the possible adverse reactions/side effects for all the Medicines you take and that have been prescribed to you. Take any new Medicines after you have completely understood and accept all the possible adverse reactions/side effects.   Please note  You were cared for by a hospitalist during your hospital stay. If you have  any questions about your discharge medications or the care you received while you were in the hospital after you are discharged, you can call the unit and asked to speak with the hospitalist on call if the hospitalist that took care of you is not available. Once you are discharged, your primary care physician will handle any further medical issues. Please note that NO REFILLS for any discharge medications will be authorized once you are discharged, as it is imperative that you return to your primary care physician (or establish a relationship with a primary care physician if you do not have one) for your aftercare needs so that they can reassess your need for medications and monitor your lab values. Today   SUBJECTIVE   I feel a lot better  VITAL SIGNS:  Blood pressure (!) 157/62, pulse 83, temperature 98.3 F (36.8 C), temperature source Oral, resp. rate 20, height 5\' 6"  (1.676 m), weight 78 kg (172 lb), SpO2 100 %.  I/O:   Intake/Output Summary (Last 24 hours) at 01/19/16 1309 Last data filed at 01/19/16 1058  Gross per 24 hour  Intake             1577 ml  Output             2350 ml  Net             -773 ml    PHYSICAL EXAMINATION:  GENERAL:  71 y.o.-year-old patient lying in the bed with no acute distress.  EYES: Pupils equal, round, reactive to light and accommodation. No scleral icterus. Extraocular muscles intact.  HEENT: Head atraumatic, normocephalic. Oropharynx and nasopharynx clear.  NECK:  Supple, no jugular venous distention. No thyroid enlargement, no tenderness.  LUNGS: Normal breath sounds bilaterally, no wheezing, rales,rhonchi or crepitation. No use of accessory muscles of respiration.  CARDIOVASCULAR: S1, S2 normal. No murmurs, rubs, or gallops.  ABDOMEN: Soft, non-tender, non-distended. Bowel sounds present. No organomegaly or mass.  EXTREMITIES: No pedal edema, cyanosis, or clubbing.  NEUROLOGIC: Cranial nerves II through XII are intact. Muscle strength 5/5 in all  extremities. Sensation intact. Gait not checked.  PSYCHIATRIC: The patient is alert and oriented x 3.  SKIN: No obvious rash, lesion, or ulcer.   DATA REVIEW:   CBC   Recent Labs Lab 01/18/16 0453  WBC 10.0  HGB 11.4*  HCT 32.9*  PLT 258    Chemistries   Recent Labs Lab 01/17/16 1127  01/18/16 0453  NA 135  --  138  K 3.9  --  3.6  CL 98*  --  108  CO2 27  --  25  GLUCOSE 186*  --  106*  BUN 16  --  12  CREATININE 1.15*  < > 0.74  CALCIUM 9.0  --  8.6*  AST 37  --   --   ALT 22  --   --   ALKPHOS 43  --   --   BILITOT 1.0  --   --   < > =  values in this interval not displayed.  Microbiology Results   Recent Results (from the past 240 hour(s))  Blood Culture (routine x 2)     Status: None (Preliminary result)   Collection Time: 01/17/16 11:27 AM  Result Value Ref Range Status   Specimen Description BLOOD LEFT AC  Final   Special Requests BOTTLES DRAWN AEROBIC AND ANAEROBIC ANA5ML AER4ML  Final   Culture NO GROWTH 2 DAYS  Final   Report Status PENDING  Incomplete  Blood Culture (routine x 2)     Status: None (Preliminary result)   Collection Time: 01/17/16 11:27 AM  Result Value Ref Range Status   Specimen Description BLOOD RIGHT WRIST  Final   Special Requests   Final    BOTTLES DRAWN AEROBIC AND ANAEROBIC ANA12ML AER14ML   Culture NO GROWTH 2 DAYS  Final   Report Status PENDING  Incomplete  Urine culture     Status: None   Collection Time: 01/17/16 11:27 AM  Result Value Ref Range Status   Specimen Description URINE, CLEAN CATCH  Final   Special Requests NONE  Final   Culture NO GROWTH Performed at [facility]   Final   Report Status 01/18/2016 FINAL  Final  Rapid Influenza A&B Antigens ([facility] only)     Status: None   Collection Time: 01/17/16 11:27 AM  Result Value Ref Range Status   Influenza A ([facility]) NEGATIVE NEGATIVE Final   Influenza B ([facility]) NEGATIVE NEGATIVE Final  Culture, group A strep     Status: None (Preliminary result)    Collection Time: 01/17/16  1:54 PM  Result Value Ref Range Status   Specimen Description THROAT  Final   Special Requests NONE  Final   Culture   Final    CULTURE REINCUBATED FOR BETTER GROWTH Performed at [facility]    Report Status PENDING  Incomplete    RADIOLOGY:  Ct Angio Chest Pe W Or Wo Contrast  Result Date: 01/17/2016 CLINICAL DATA:  70 year old female with hypoxia and recent diagnosis of upper respiratory infection EXAM: CT ANGIOGRAPHY CHEST WITH CONTRAST TECHNIQUE: Multidetector CT imaging of the chest was performed using the standard protocol during bolus administration of intravenous contrast. Multiplanar CT image reconstructions and MIPs were obtained to evaluate the vascular anatomy. CONTRAST:  75 mL Isovue 370 COMPARISON:  Prior chest x-ray 01/17/2016 FINDINGS: Cardiovascular: Adequate opacification of the pulmonary arteries to the proximal subsegmental level. No evidence of acute pulmonary embolus. Conventional 3 vessel arch anatomy. The aorta is normal in size. No dissection. The heart is normal in size. No pericardial effusion. Trace calcifications along the left anterior descending coronary artery. Mediastinum/Nodes: Diffusely patulous thoracic esophagus. Surgical changes suggest prior gastric bypass operation. No suspicious mediastinal mass or adenopathy. Lungs/Pleura: Multifocal patchy ground-glass attenuation airspace opacities noted in the right upper, bilateral lower and right middle lobe. There is relative sparing of the left upper lobe. The appearance is most suggestive of a multifocal infectious/ inflammatory process. There is also diffuse mild bronchial wall thickening. The no pneumothorax or pleural effusion. Upper Abdomen: Metallic radiopacity noted anterior to the spleen. This is of uncertain etiology. Otherwise, the visualized upper abdomen is unremarkable. Musculoskeletal: No acute fracture or aggressive appearing lytic or blastic osseous lesion. Review of the  MIP images confirms the above findings. IMPRESSION: 1. The combination of diffuse mild bronchial wall thickening with patchy ground-glass attenuation airspace opacities in a peribronchovascular distribution involving the right upper, right middle and bilateral lower lobes is most consistent with  a multifocal infectious/inflammatory process such as atypical or viral pneumonia. Multifocal bronchopneumonia is also a possibility. 2. Negative for evidence of acute pulmonary embolus. 3. Trace calcifications along the left anterior descending coronary artery. 4. Diffusely dilated and patulous thoracic esophagus. Does the patient have a history of dysphagia, gastrointestinal reflux disease or distal esophageal stricture? 5. Surgical changes of prior gastric bypass surgery. Electronically Signed   By: Jacqulynn Cadet M.D.   On: 01/17/2016 14:21     Management plans discussed with the patient, family and they are in agreement.  CODE STATUS:     Code Status Orders        Start     Ordered   01/17/16 1541  Full code  Continuous     01/17/16 1540    Code Status History    Date Active Date Inactive Code Status Order ID Comments User Context   This patient has a current code status but no historical code status.      TOTAL TIME TAKING CARE OF THIS PATIENT: 40 minutes.    Joyanna Kleman M.D on 01/19/2016 at 1:09 PM  Between 7am to 6pm - Pager - [phone] After 6pm go to [email or web address] - password [email or web address] [facility]  Office  [phone]  CC: Primary care physician; Lelon Huh, MD

## 2016-01-19 NOTE — Plan of Care (Signed)
Problem: Activity: Goal: Risk for activity intolerance will decrease Outcome: Progressing Pt tolerated ambulating around nurses station on R.A. and maintained sats at 100%.

## 2016-01-19 NOTE — Progress Notes (Signed)
IV was removed. Discharge instructions, follow-up appointments, and prescriptions were provided to the pt and husband at bedside. The pt was taken downstairs via wheelchair by NT.  

## 2016-01-19 NOTE — Telephone Encounter (Signed)
Pt is scheduled for Hospital F/U on 01/26/16 at 10 am for Hospital F/U she is being discharged today 01/19/16 and was treated for respiratory distress. Thanks TNP

## 2016-01-20 ENCOUNTER — Telehealth: Payer: Self-pay

## 2016-01-20 LAB — CULTURE, GROUP A STREP (THRC)

## 2016-01-20 NOTE — Telephone Encounter (Signed)
Transition Care Management Follow-up Telephone Call    Date discharged? 01/19/16  How have you been since you were released from the hospital? Pt states she is weak, nausea at times, and still coughing, but not as bad. Pt denies having sputum, fever, vomiting or diarrhea.    Any patient concerns? none   Items Reviewed:  Medications reviewed: Yes  Allergies reviewed: Yes  Dietary changes reviewed: N/A  Referrals reviewed: N/A   Functional Questionnaire:  Independent - I Dependent - D    Activities of Daily Living (ADLs):    Personal hygiene - I Dressing - I Eating - I Maintaining continence - I Transferring - I   Independent Activities of Daily Living (iADLs): Basic communication skills - I Transportation - I Meal preparation - I Shopping - I Housework - I Managing medications - I  Managing personal finances - I   Confirmed importance and date/time of follow-up visits scheduled YES  Provider Appointment booked with PCP 01/26/16 @ 10 AM.  Confirmed with patient if condition begins to worsen call PCP or go to the ER.  Patient was given the office number and encouraged to call back with question or concerns: YES

## 2016-01-22 LAB — CULTURE, BLOOD (ROUTINE X 2)
Culture: NO GROWTH
Culture: NO GROWTH

## 2016-01-26 ENCOUNTER — Ambulatory Visit (INDEPENDENT_AMBULATORY_CARE_PROVIDER_SITE_OTHER): Payer: Medicare Other | Admitting: Family Medicine

## 2016-01-26 ENCOUNTER — Encounter: Payer: Self-pay | Admitting: Family Medicine

## 2016-01-26 DIAGNOSIS — K229 Disease of esophagus, unspecified: Secondary | ICD-10-CM

## 2016-01-26 DIAGNOSIS — R05 Cough: Secondary | ICD-10-CM | POA: Diagnosis not present

## 2016-01-26 DIAGNOSIS — R059 Cough, unspecified: Secondary | ICD-10-CM

## 2016-01-26 MED ORDER — CEFUROXIME AXETIL 500 MG PO TABS: 500.0000 mg | ORAL_TABLET | Freq: Two times a day (BID) | ORAL | 0 refills | Status: AC

## 2016-01-26 MED ORDER — HYDROCODONE-HOMATROPINE 5-1.5 MG/5ML PO SYRP: 5.0000 mL | ORAL_SOLUTION | Freq: Three times a day (TID) | ORAL | 0 refills | Status: AC | PRN

## 2016-01-26 NOTE — Progress Notes (Signed)
Patient: Joan Baldwin Female    DOB: 1945-03-30   71 y.o.   MRN: [id number] Visit Date: 01/26/2016  Today's Provider: Lelon Huh, MD   Chief Complaint  Patient presents with  . Hospitalization Follow-up   Subjective:    HPI     Follow up Hospitalization  Patient was admitted to [facility] on 01/17/2016 and discharged on 01/19/2016. She was treated for hypoxia and sepsis due to multifocal pneumonia. Treatment for this included Ceftin 500 mg BID. Chest CT scan was negative for PE, and showed multifocal pneumonia. Telephone follow up was done on 01/20/2016 She reports excellent compliance with treatment. She reports this condition is Improved. Pt reports she is feeling better, but is anxious of the diagnoses of pneumonia and sepsis, and would like a refill of Ceftin. Pt denies cough, but reports she is still weak and tired.  Was seen here on 01-17-2016 with fever sore throat and cough and start azithromycin.She states throat pain worsened over night to the point that she couldn't breath due to swelling in throat and admitted for multifocal pneumonia based on chest CT. She was discharged on cefuroxime.   Of interest was diffusely dilated and patulous thoracic esophagus. She states her sore throat is now 95% better. States she was having some trouble swallowing before admission, but is no longer.  ------------------------------------------------------------------------------------     Allergies  Allergen Reactions  . Codeine Itching  . Ultram  [Tramadol Hcl]     Insomnia     Current Outpatient Prescriptions:  .  aspirin 81 MG tablet, Take 1 tablet by mouth daily., Disp: , Rfl:  .  Calcium Carbonate-Vitamin D (CALCIUM 600/VITAMIN D PO), Take 1 tablet by mouth 2 (two) times daily., Disp: , Rfl:  .  diazepam (VALIUM) 5 MG tablet, Take 1 tablet (5 mg total) by mouth every 6 (six) hours as needed. for anxiety, Disp: 120 tablet, Rfl: 5 .  Fiber POWD, Take 2 scoop by mouth  daily., Disp: , Rfl:  .  hydrochlorothiazide (HYDRODIURIL) 25 MG tablet, TAKE ONE TABLET BY MOUTH ONCE DAILY, Disp: 30 tablet, Rfl: 12 .  latanoprost (XALATAN) 0.005 % ophthalmic solution, Place 1 drop into both eyes at bedtime., Disp: , Rfl:  .  lisinopril (PRINIVIL,ZESTRIL) 10 MG tablet, TAKE ONE-HALF TABLET BY MOUTH ONCE DAILY, Disp: 30 tablet, Rfl: 12 .  Multiple Vitamins-Minerals (MULTIVITAMIN ADULT PO), Take 1 tablet by mouth daily., Disp: , Rfl:  .  Omega-3 Fatty Acids (FISH OIL) 1000 MG CAPS, Take 1 capsule by mouth 2 (two) times daily., Disp: , Rfl:  .  omeprazole (PRILOSEC) 20 MG capsule, TAKE ONE CAPSULE BY MOUTH ONCE DAILY, Disp: 30 capsule, Rfl: 12 .  Timolol Maleate 0.5 % (DAILY) SOLN, Place 1 drop into both eyes at bedtime., Disp: , Rfl:  .  HYDROcodone-homatropine (HYCODAN) 5-1.5 MG/5ML syrup, Take 5 mLs by mouth every 8 (eight) hours as needed for cough. (Patient not taking: Reported on 01/26/2016), Disp: 120 mL, Rfl: 0  Review of Systems  Constitutional: Positive for activity change and fatigue. Negative for appetite change, chills, diaphoresis, fever and unexpected weight change.  HENT: Positive for ear pain (right). Negative for sore throat.   Respiratory: Positive for shortness of breath. Negative for cough and chest tightness.   Cardiovascular: Negative for chest pain, palpitations and leg swelling.  Hematological: Negative for adenopathy.    Social History  Substance Use Topics  . Smoking status: Former Smoker    Packs/day: 1.00  Years: 30.00    Types: Cigarettes    Quit date: 01/11/2001  . Smokeless tobacco: Never Used  . Alcohol use No   Objective:   BP 122/72 (BP Location: Left Arm, Patient Position: Sitting, Cuff Size: Normal)   Pulse 88   Temp 98.5 F (36.9 C) (Oral)   Resp 20   Wt 167 lb (75.8 kg)   SpO2 98%   BMI 26.95 kg/m   Physical Exam  General Appearance:    Alert, cooperative, no distress  HENT:   ENT exam normal, no neck nodes or sinus  tenderness  Eyes:    PERRL, conjunctiva/corneas clear, EOM's intact       Lungs:     Clear to auscultation bilaterally, respirations unlabored  Heart:    Regular rate and rhythm  Neurologic:   Awake, alert, oriented x 3. No apparent focal neurological           defect.           Assessment & Plan:     1. Cough  - HYDROcodone-homatropine (HYCODAN) 5-1.5 MG/5ML syrup; Take 5 mLs by mouth every 8 (eight) hours as needed for cough.  Dispense: 120 mL; Refill: 0  2. Esophageal abnormality Noted on chest CT associated with sensation of throat swelling at the time. This symtom has completely resolved. She does have history of GERD and gastric bypass. Recommended GI referral for further evaluation, but she decline for now since symptoms have mostly resolved. She does still have a bit of a cough and given rx for hydrocodone-homatropine. She is to call if cough not completely resolved in a week.      Patient seen and examined by Lelon Huh, MD, and note scribed by Renaldo Fiddler, CMA.  Lelon Huh, MD  [facility]

## 2016-02-03 DIAGNOSIS — K229 Disease of esophagus, unspecified: Secondary | ICD-10-CM | POA: Insufficient documentation

## 2016-04-22 DIAGNOSIS — H401131 Primary open-angle glaucoma, bilateral, mild stage: Secondary | ICD-10-CM | POA: Diagnosis not present

## 2016-04-28 DIAGNOSIS — H401131 Primary open-angle glaucoma, bilateral, mild stage: Secondary | ICD-10-CM | POA: Diagnosis not present

## 2016-05-26 ENCOUNTER — Encounter: Payer: Self-pay | Admitting: Family Medicine

## 2016-05-26 ENCOUNTER — Ambulatory Visit (INDEPENDENT_AMBULATORY_CARE_PROVIDER_SITE_OTHER): Payer: Medicare Other | Admitting: Family Medicine

## 2016-05-26 ENCOUNTER — Ambulatory Visit (INDEPENDENT_AMBULATORY_CARE_PROVIDER_SITE_OTHER): Payer: Medicare Other

## 2016-05-26 VITALS — BP 138/82

## 2016-05-26 VITALS — BP 148/72 | HR 68 | Temp 99.0°F | Ht 66.0 in | Wt 169.2 lb

## 2016-05-26 DIAGNOSIS — Z0000 Encounter for general adult medical examination without abnormal findings: Secondary | ICD-10-CM | POA: Diagnosis not present

## 2016-05-26 DIAGNOSIS — Z1211 Encounter for screening for malignant neoplasm of colon: Secondary | ICD-10-CM | POA: Diagnosis not present

## 2016-05-26 DIAGNOSIS — F419 Anxiety disorder, unspecified: Secondary | ICD-10-CM | POA: Diagnosis not present

## 2016-05-26 DIAGNOSIS — M810 Age-related osteoporosis without current pathological fracture: Secondary | ICD-10-CM | POA: Diagnosis not present

## 2016-05-26 DIAGNOSIS — R7303 Prediabetes: Secondary | ICD-10-CM

## 2016-05-26 DIAGNOSIS — K219 Gastro-esophageal reflux disease without esophagitis: Secondary | ICD-10-CM

## 2016-05-26 DIAGNOSIS — I10 Essential (primary) hypertension: Secondary | ICD-10-CM

## 2016-05-26 DIAGNOSIS — Z87891 Personal history of nicotine dependence: Secondary | ICD-10-CM | POA: Diagnosis not present

## 2016-05-26 LAB — POCT GLYCOSYLATED HEMOGLOBIN (HGB A1C)
ESTIMATED AVERAGE GLUCOSE: 128
Hemoglobin A1C: 6.1

## 2016-05-26 MED ORDER — DIAZEPAM 5 MG PO TABS: 5.0000 mg | ORAL_TABLET | Freq: Four times a day (QID) | ORAL | 5 refills | Status: DC | PRN

## 2016-05-26 NOTE — Progress Notes (Signed)
Patient: Joan Baldwin, Female    DOB: November 20, 1945, 71 y.o.   MRN: [id number] Visit Date: 05/26/2016  Today's Provider: Lelon Huh, MD   Chief Complaint  Patient presents with  . Annual Exam  . Hypertension    follow up  . Gastroesophageal Reflux    follow up  . Anxiety    follow up  . Hyperglycemia    follow up   Subjective:    Annual physical exam Joan Baldwin is a 71 y.o. female who presents today for health maintenance and complete physical. She feels well. She reports exercising 4-5 times a week. She reports she is sleeping fairly well.  -----------------------------------------------------------------   Prediabetes, Follow-up:   Lab Results  Component Value Date   HGBA1C 6.0 11/24/2015   HGBA1C 6.2 05/26/2015   HGBA1C 5.9 11/26/2014   GLUCOSE 106 (H) 01/18/2016   GLUCOSE 186 (H) 01/17/2016   GLUCOSE 121 (H) 11/26/2014    Last seen for for this6 months ago.  Management since that visit includes counseling patient to continue to avoid sweets and starchy foods and increase exercise. . Current symptoms include none and have been stable.  Weight trend: fluctuating a bit Prior visit with dietician: no Current diet: in general, a "healthy" diet   Current exercise: walking  Pertinent Labs:    Component Value Date/Time   CHOL 168 08/21/2013   TRIG 103 08/21/2013   CREATININE 0.74 01/18/2016 0453    Wt Readings from Last 3 Encounters:  05/26/16 169 lb 3.2 oz (76.7 kg)  01/26/16 167 lb (75.8 kg)  01/17/16 172 lb (78 kg)    Hypertension, follow-up:  BP Readings from Last 3 Encounters:  05/26/16 (!) 148/72  01/26/16 122/72  01/19/16 (!) 157/62    She was last seen for hypertension 6 months ago.  BP at that visit was 120/80. Management since that visit includes no changes. She reports good compliance with treatment. She is not having side effects.  She is exercising. She is adherent to low salt diet.   Outside blood pressures are  checked at home and average 130/65. She is experiencing dyspnea and palpitations.  Patient denies chest pain, chest pressure/discomfort, claudication, exertional chest pressure/discomfort, fatigue, irregular heart beat, lower extremity edema, near-syncope, orthopnea, paroxysmal nocturnal dyspnea, syncope and tachypnea.   Cardiovascular risk factors include advanced age (older than 39 for men, 56 for women) and hypertension.  Use of agents associated with hypertension: NSAIDS.     Weight trend: fluctuating a bit Wt Readings from Last 3 Encounters:  05/26/16 169 lb 3.2 oz (76.7 kg)  01/26/16 167 lb (75.8 kg)  01/17/16 172 lb (78 kg)    Current diet: in general, a "healthy" diet    ------------------------------------------------------------------------ Follow up of Anxiety:  Patient was last seen for this problem 6 months ago and no changes were made. Patient reports good compliance with treatment, good tolerance and good symptom control.   Follow up of GERD:  Patient was last seen for this problem 6 months ago and no changes were made. Patient reports good compliance with treatment, good tolerance and good symptom control.   Review of Systems  Constitutional: Negative for chills, fatigue and fever.  HENT: Positive for dental problem, mouth sores and rhinorrhea. Negative for congestion, ear pain, sneezing and sore throat.   Eyes: Positive for redness. Negative for pain.  Respiratory: Positive for shortness of breath. Negative for cough and wheezing.   Cardiovascular: Positive for palpitations. Negative for chest  pain and leg swelling.  Gastrointestinal: Negative for abdominal pain, blood in stool, constipation, diarrhea and nausea.  Endocrine: Negative for polydipsia and polyphagia.  Genitourinary: Negative.  Negative for dysuria, flank pain, hematuria, pelvic pain, vaginal bleeding and vaginal discharge.  Musculoskeletal: Positive for back pain. Negative for arthralgias, gait problem  and joint swelling.  Skin: Negative for rash.  Allergic/Immunologic: Positive for environmental allergies.  Neurological: Positive for syncope. Negative for dizziness, tremors, seizures, weakness, light-headedness, numbness and headaches.  Hematological: Negative for adenopathy.  Psychiatric/Behavioral: Negative for behavioral problems, confusion and dysphoric mood. The patient is nervous/anxious. The patient is not hyperactive.     Social History      She  reports that she quit smoking about 15 years ago. Her smoking use included Cigarettes. She has a 30.00 pack-year smoking history. She has never used smokeless tobacco. She reports that she does not drink alcohol or use drugs.       Social History   Social History  . Marital status: Married    Spouse name: N/A  . Number of children: 3  . Years of education: N/A   Occupational History  . Retired    Social History Main Topics  . Smoking status: Former Smoker    Packs/day: 1.00    Years: 30.00    Types: Cigarettes    Quit date: 01/11/2001  . Smokeless tobacco: Never Used  . Alcohol use No  . Drug use: No  . Sexual activity: Not on file   Other Topics Concern  . Not on file   Social History Narrative  . No narrative on file    Past Medical History:  Diagnosis Date  . History of chicken pox   . Hypertension      Patient Active Problem List   Diagnosis Date Noted  . Esophageal abnormality 02/03/2016  . Sepsis ([place]) 01/17/2016  . Glaucoma 11/22/2014  . Skin lesion 11/22/2014  . Osteoporosis 02/13/2013  . Pre-diabetes 08/10/2009  . Essential (primary) hypertension 05/06/2006  . Anxiety 01/11/2001  . Insomnia 01/11/2001  . GERD (gastroesophageal reflux disease) 01/11/2001    Past Surgical History:  Procedure Laterality Date  . ABDOMINAL HYSTERECTOMY     vaginal. Still has both ovaries  . back surgeries     x 2  . COLONOSCOPY  2007   done in [place] per patient. Normal except for moderate hemorrhoids  .  MOUTH SURGERY  2011  . STOMACH SURGERY     staples for obesity  . Tailbone surgery      Family History        Family Status  Relation Status  . Mother Deceased at age 3       EMPHYSEMA  . Father Deceased at age 39       MVA  . Sister Deceased  . Brother Alive  . Brother Alive        Her family history includes Diabetes in her brother; Emphysema in her mother; Heart Problems in her brother; Kidney failure in her sister.     Allergies  Allergen Reactions  . Codeine Itching  . Ultram  [Tramadol Hcl]     Insomnia     Current Outpatient Prescriptions:  .  acetaminophen (TYLENOL) 500 MG tablet, Take 500 mg by mouth every 8 (eight) hours as needed., Disp: , Rfl:  .  aspirin 81 MG tablet, Take 1 tablet by mouth daily., Disp: , Rfl:  .  Calcium Carbonate-Vitamin D (CALCIUM 600/VITAMIN D PO), Take 1 tablet  by mouth 2 (two) times daily., Disp: , Rfl:  .  diazepam (VALIUM) 5 MG tablet, Take 1 tablet (5 mg total) by mouth every 6 (six) hours as needed. for anxiety (Patient taking differently: Take 5 mg by mouth every 6 (six) hours as needed. for anxiety), Disp: 120 tablet, Rfl: 5 .  Fiber POWD, Take 2 scoop by mouth daily., Disp: , Rfl:  .  hydrochlorothiazide (HYDRODIURIL) 25 MG tablet, TAKE ONE TABLET BY MOUTH ONCE DAILY, Disp: 30 tablet, Rfl: 12 .  latanoprost (XALATAN) 0.005 % ophthalmic solution, Place 1 drop into both eyes at bedtime., Disp: , Rfl:  .  lisinopril (PRINIVIL,ZESTRIL) 10 MG tablet, TAKE ONE-HALF TABLET BY MOUTH ONCE DAILY, Disp: 30 tablet, Rfl: 12 .  Multiple Vitamins-Minerals (MULTIVITAMIN ADULT PO), Take 1 tablet by mouth daily., Disp: , Rfl:  .  Omega-3 Fatty Acids (FISH OIL) 1000 MG CAPS, Take 1 capsule by mouth 2 (two) times daily., Disp: , Rfl:  .  omeprazole (PRILOSEC) 20 MG capsule, TAKE ONE CAPSULE BY MOUTH ONCE DAILY, Disp: 30 capsule, Rfl: 12 .  Timolol Maleate 0.5 % (DAILY) SOLN, Place 1 drop into both eyes at bedtime., Disp: , Rfl:    Patient Care  Team: Birdie Sons, MD as PCP - General (Family Medicine) Birder Robson, MD as Referring Physician (Ophthalmology)      Objective:   Vitals: Vital Signs - Last Recorded  Most recent update: 05/26/2016 9:03 AM by Fabio Neighbors, LPN  BP    185/63 (BP Location: Right Arm)     Pulse  68     Temp  99 F (37.2 C) (Oral)     Ht  5\' 6"  (1.676 m)     Wt  169 lb 3.2 oz (76.7 kg)      BMI  27.31 kg/m     Vitals:   05/26/16 1030  BP: 138/82       Physical Exam   General Appearance:    Alert, cooperative, no distress, appears stated age  Head:    Normocephalic, without obvious abnormality, atraumatic  Eyes:    PERRL, conjunctiva/corneas clear, EOM's intact, fundi    benign, both eyes  Ears:    Normal TM's and external ear canals, both ears  Nose:   Nares normal, septum midline, mucosa normal, no drainage    or sinus tenderness  Throat:   Lips, mucosa, and tongue normal; teeth and gums normal  Neck:   Supple, symmetrical, trachea midline, no adenopathy;    thyroid:  no enlargement/tenderness/nodules; no carotid   bruit or JVD  Back:     Symmetric, no curvature, ROM normal, no CVA tenderness  Lungs:     Clear to auscultation bilaterally, respirations unlabored  Chest Wall:    No tenderness or deformity   Heart:    Regular rate and rhythm, S1 and S2 normal, no murmur, rub   or gallop  Breast Exam:    refused  Abdomen:     Soft, non-tender, bowel sounds active all four quadrants,    no masses, no organomegaly  Pelvic:    deferred  Extremities:   Extremities normal, atraumatic, no cyanosis or edema  Pulses:   2+ and symmetric all extremities  Skin:   Skin color, texture, turgor normal, no rashes or lesions  Lymph nodes:   Cervical, supraclavicular, and axillary nodes normal  Neurologic:   CNII-XII intact, normal strength, sensation and reflexes    throughout    Depression Screen PHQ 2/9 Scores 05/26/2016  05/26/2016 11/26/2014  PHQ - 2 Score 0 0 0  PHQ-  9 Score 0 - -   Results for orders placed or performed in visit on 05/26/16  POCT HgB A1C  Result Value Ref Range   Hemoglobin A1C 6.1    Est. average glucose Bld gHb Est-mCnc 128       Assessment & Plan:     Routine Health Maintenance and Physical Exam  Exercise Activities and Dietary recommendations Goals    . Exercise 3x per week (30 min per time)          Recommend exercising 4 days a week for 30 minutes. Pt plans to do this once feeling better.        Immunization History  Administered Date(s) Administered  . Influenza, High Dose Seasonal PF 11/26/2014, 09/05/2015  . Pneumococcal Conjugate-13 11/26/2014  . Pneumococcal Polysaccharide-23 09/10/2008, 12/19/2009  . Zoster 11/21/2012    Health Maintenance  Topic Date Due  . FOOT EXAM  02/08/1953  . DEXA SCAN  02/14/2015  . HEMOGLOBIN A1C  05/23/2016  . MAMMOGRAM  05/11/2017 (Originally 04/22/2016)  . Hepatitis C Screening  06/16/2017 (Originally 03-07-1945)  . COLONOSCOPY  01/11/2026 (Originally 02/08/1993)  . TETANUS/TDAP  01/11/2026 (Originally 02/08/1962)  . INFLUENZA VACCINE  08/11/2016  . OPHTHALMOLOGY EXAM  04/11/2017  . PNA vac Low Risk Adult  Completed     Discussed health benefits of physical activity, and encouraged her to engage in regular exercise appropriate for her age and condition.    --------------------------------------------------------------------  1. Annual physical exam   2. Pre-diabetes  - POCT HgB A1C  3. Colon cancer screening  - Cologuard  4. Essential (primary) hypertension Stable. Continue current medications.    5. Gastroesophageal reflux disease without esophagitis Well controlled.  Continue current medications.    6. Osteoporosis, unspecified osteoporosis type, unspecified pathological fracture presence  - DG Bone Density; Future  7. Anxiety  - diazepam (VALIUM) 5 MG tablet; Take 1 tablet (5 mg total) by mouth every 6 (six) hours as needed. for anxiety  Dispense:  120 tablet; Refill: 5  8. History of smoking 30 or more pack years Had lunc CT in January when hospitalized for pneumonia.     Lelon Huh, MD  [facility]

## 2016-05-26 NOTE — Patient Instructions (Signed)
Joan Baldwin , Thank you for taking time to come for your Medicare Wellness Visit. I appreciate your ongoing commitment to your health goals. Please review the following plan we discussed and let me know if I can assist you in the future.   Screening recommendations/referrals: Colonoscopy: completed 2007, declined order today Mammogram: declined Bone Density: completed 02/13/13, due 05/2023 Recommended yearly ophthalmology/optometry visit for glaucoma screening and checkup Recommended yearly dental visit for hygiene and checkup  Vaccinations: Influenza vaccine: up to date, due 09/2016 Pneumococcal vaccine: completed series Tdap vaccine: declined Shingles vaccine: completed 11/21/12   Advanced directives: Advance directive discussed with you today. I have provided a copy for you to complete at home and have notarized. Once this is complete please bring a copy in to our office so we can scan it into your chart.  Conditions/risks identified: Recommend increasing exercise to 4 days a week for 30 minutes.  Next appointment: None, need to schedule 1 year AWV   Preventive Care 73 Years and Older, Female Preventive care refers to lifestyle choices and visits with your health care provider that can promote health and wellness. What does preventive care include?  A yearly physical exam. This is also called an annual well check.  Dental exams once or twice a year.  Routine eye exams. Ask your health care provider how often you should have your eyes checked.  Personal lifestyle choices, including:  Daily care of your teeth and gums.  Regular physical activity.  Eating a healthy diet.  Avoiding tobacco and drug use.  Limiting alcohol use.  Practicing safe sex.  Taking low-dose aspirin every day.  Taking vitamin and mineral supplements as recommended by your health care provider. What happens during an annual well check? The services and screenings done by your health care  provider during your annual well check will depend on your age, overall health, lifestyle risk factors, and family history of disease. Counseling  Your health care provider may ask you questions about your:  Alcohol use.  Tobacco use.  Drug use.  Emotional well-being.  Home and relationship well-being.  Sexual activity.  Eating habits.  History of falls.  Memory and ability to understand (cognition).  Work and work Statistician.  Reproductive health. Screening  You may have the following tests or measurements:  Height, weight, and BMI.  Blood pressure.  Lipid and cholesterol levels. These may be checked every 5 years, or more frequently if you are over 49 years old.  Skin check.  Lung cancer screening. You may have this screening every year starting at age 40 if you have a 30-pack-year history of smoking and currently smoke or have quit within the past 15 years.  Fecal occult blood test (FOBT) of the stool. You may have this test every year starting at age 59.  Flexible sigmoidoscopy or colonoscopy. You may have a sigmoidoscopy every 5 years or a colonoscopy every 10 years starting at age 68.  Hepatitis C blood test.  Hepatitis B blood test.  Sexually transmitted disease (STD) testing.  Diabetes screening. This is done by checking your blood sugar (glucose) after you have not eaten for a while (fasting). You may have this done every 1-3 years.  Bone density scan. This is done to screen for osteoporosis. You may have this done starting at age 84.  Mammogram. This may be done every 1-2 years. Talk to your health care provider about how often you should have regular mammograms. Talk with your health care provider about your  test results, treatment options, and if necessary, the need for more tests. Vaccines  Your health care provider may recommend certain vaccines, such as:  Influenza vaccine. This is recommended every year.  Tetanus, diphtheria, and acellular  pertussis (Tdap, Td) vaccine. You may need a Td booster every 10 years.  Zoster vaccine. You may need this after age 27.  Pneumococcal 13-valent conjugate (PCV13) vaccine. One dose is recommended after age 37.  Pneumococcal polysaccharide (PPSV23) vaccine. One dose is recommended after age 23. Talk to your health care provider about which screenings and vaccines you need and how often you need them. This information is not intended to replace advice given to you by your health care provider. Make sure you discuss any questions you have with your health care provider. Document Released: 01/24/2015 Document Revised: 09/17/2015 Document Reviewed: 10/29/2014 Elsevier Interactive Patient Education  2017 [place] Prevention in the Home Falls can cause injuries. They can happen to people of all ages. There are many things you can do to make your home safe and to help prevent falls. What can I do on the outside of my home?  Regularly fix the edges of walkways and driveways and fix any cracks.  Remove anything that might make you trip as you walk through a door, such as a raised step or threshold.  Trim any bushes or trees on the path to your home.  Use bright outdoor lighting.  Clear any walking paths of anything that might make someone trip, such as rocks or tools.  Regularly check to see if handrails are loose or broken. Make sure that both sides of any steps have handrails.  Any raised decks and porches should have guardrails on the edges.  Have any leaves, snow, or ice cleared regularly.  Use sand or salt on walking paths during winter.  Clean up any spills in your garage right away. This includes oil or grease spills. What can I do in the bathroom?  Use night lights.  Install grab bars by the toilet and in the tub and shower. Do not use towel bars as grab bars.  Use non-skid mats or decals in the tub or shower.  If you need to sit down in the shower, use a plastic,  non-slip stool.  Keep the floor dry. Clean up any water that spills on the floor as soon as it happens.  Remove soap buildup in the tub or shower regularly.  Attach bath mats securely with double-sided non-slip rug tape.  Do not have throw rugs and other things on the floor that can make you trip. What can I do in the bedroom?  Use night lights.  Make sure that you have a light by your bed that is easy to reach.  Do not use any sheets or blankets that are too big for your bed. They should not hang down onto the floor.  Have a firm chair that has side arms. You can use this for support while you get dressed.  Do not have throw rugs and other things on the floor that can make you trip. What can I do in the kitchen?  Clean up any spills right away.  Avoid walking on wet floors.  Keep items that you use a lot in easy-to-reach places.  If you need to reach something above you, use a strong step stool that has a grab bar.  Keep electrical cords out of the way.  Do not use floor polish or wax that  makes floors slippery. If you must use wax, use non-skid floor wax.  Do not have throw rugs and other things on the floor that can make you trip. What can I do with my stairs?  Do not leave any items on the stairs.  Make sure that there are handrails on both sides of the stairs and use them. Fix handrails that are broken or loose. Make sure that handrails are as long as the stairways.  Check any carpeting to make sure that it is firmly attached to the stairs. Fix any carpet that is loose or worn.  Avoid having throw rugs at the top or bottom of the stairs. If you do have throw rugs, attach them to the floor with carpet tape.  Make sure that you have a light switch at the top of the stairs and the bottom of the stairs. If you do not have them, ask someone to add them for you. What else can I do to help prevent falls?  Wear shoes that:  Do not have high heels.  Have rubber  bottoms.  Are comfortable and fit you well.  Are closed at the toe. Do not wear sandals.  If you use a stepladder:  Make sure that it is fully opened. Do not climb a closed stepladder.  Make sure that both sides of the stepladder are locked into place.  Ask someone to hold it for you, if possible.  Clearly mark and make sure that you can see:  Any grab bars or handrails.  First and last steps.  Where the edge of each step is.  Use tools that help you move around (mobility aids) if they are needed. These include:  Canes.  Walkers.  Scooters.  Crutches.  Turn on the lights when you go into a dark area. Replace any light bulbs as soon as they burn out.  Set up your furniture so you have a clear path. Avoid moving your furniture around.  If any of your floors are uneven, fix them.  If there are any pets around you, be aware of where they are.  Review your medicines with your doctor. Some medicines can make you feel dizzy. This can increase your chance of falling. Ask your doctor what other things that you can do to help prevent falls. This information is not intended to replace advice given to you by your health care provider. Make sure you discuss any questions you have with your health care provider. Document Released: 10/24/2008 Document Revised: 06/05/2015 Document Reviewed: 02/01/2014 Elsevier Interactive Patient Education  2017 [employer].

## 2016-05-26 NOTE — Patient Instructions (Signed)
Please call the [facility] ([phone]) to schedule a routine screening mammogram.  

## 2016-05-26 NOTE — Progress Notes (Signed)
Subjective:   Joan Baldwin is a 71 y.o. female who presents for Medicare Annual (Subsequent) preventive examination.  Review of Systems:  N/A  Cardiac Risk Factors include: advanced age (>67men, >62 women);hypertension     Objective:     Vitals: BP (!) 148/72 (BP Location: Right Arm)   Pulse 68   Temp 99 F (37.2 C) (Oral)   Ht 5\' 6"  (1.676 m)   Wt 169 lb 3.2 oz (76.7 kg)   BMI 27.31 kg/m   Body mass index is 27.31 kg/m.   Tobacco History  Smoking Status  . Former Smoker  . Packs/day: 1.00  . Years: 30.00  . Types: Cigarettes  . Quit date: 01/11/2001  Smokeless Tobacco  . Never Used     Counseling given: Not Answered   Past Medical History:  Diagnosis Date  . History of chicken pox   . Hypertension    Past Surgical History:  Procedure Laterality Date  . ABDOMINAL HYSTERECTOMY     vaginal. Still has both ovaries  . back surgeries     x 2  . COLONOSCOPY  2007   done in [place] per patient. Normal except for moderate hemorrhoids  . MOUTH SURGERY  2011  . STOMACH SURGERY     staples for obesity  . Tailbone surgery     Family History  Problem Relation Age of Onset  . Emphysema Mother   . Kidney failure Sister   . Heart Problems Brother   . Diabetes Brother    History  Sexual Activity  . Sexual activity: Not on file    Outpatient Encounter Prescriptions as of 05/26/2016  Medication Sig  . acetaminophen (TYLENOL) 500 MG tablet Take 500 mg by mouth every 8 (eight) hours as needed.  Marland Kitchen aspirin 81 MG tablet Take 1 tablet by mouth daily.  . Calcium Carbonate-Vitamin D (CALCIUM 600/VITAMIN D PO) Take 1 tablet by mouth 2 (two) times daily.  . diazepam (VALIUM) 5 MG tablet Take 1 tablet (5 mg total) by mouth every 6 (six) hours as needed. for anxiety (Patient taking differently: Take 5 mg by mouth every 6 (six) hours as needed. for anxiety)  . Fiber POWD Take 2 scoop by mouth daily.  . hydrochlorothiazide (HYDRODIURIL) 25 MG tablet TAKE ONE TABLET  BY MOUTH ONCE DAILY  . latanoprost (XALATAN) 0.005 % ophthalmic solution Place 1 drop into both eyes at bedtime.  Marland Kitchen lisinopril (PRINIVIL,ZESTRIL) 10 MG tablet TAKE ONE-HALF TABLET BY MOUTH ONCE DAILY  . Multiple Vitamins-Minerals (MULTIVITAMIN ADULT PO) Take 1 tablet by mouth daily.  . Omega-3 Fatty Acids (FISH OIL) 1000 MG CAPS Take 1 capsule by mouth 2 (two) times daily.  Marland Kitchen omeprazole (PRILOSEC) 20 MG capsule TAKE ONE CAPSULE BY MOUTH ONCE DAILY  . Timolol Maleate 0.5 % (DAILY) SOLN Place 1 drop into both eyes at bedtime.   No facility-administered encounter medications on file as of 05/26/2016.     Activities of Daily Living In your present state of health, do you have any difficulty performing the following activities: 05/26/2016 01/17/2016  Hearing? N -  Vision? Y -  Difficulty concentrating or making decisions? Y -  Walking or climbing stairs? N -  Dressing or bathing? N -  Doing errands, shopping? N N  Preparing Food and eating ? N -  Using the Toilet? N -  In the past six months, have you accidently leaked urine? N -  Do you have problems with loss of bowel control? N -  Managing your Medications? N -  Managing your Finances? N -  Housekeeping or managing your Housekeeping? N -  Some recent data might be hidden    Patient Care Team: Birdie Sons, MD as PCP - General (Family Medicine) Birder Robson, MD as Referring Physician (Ophthalmology)    Assessment:     Exercise Activities and Dietary recommendations Current Exercise Habits: Home exercise routine, Type of exercise: walking;treadmill, Time (Minutes): 20, Frequency (Times/Week): 4 (to 5 days), Weekly Exercise (Minutes/Week): 80, Intensity: Mild  Goals    . Exercise 3x per week (30 min per time)          Recommend exercising 4 days a week for 30 minutes. Pt plans to do this once feeling better.       Fall Risk Fall Risk  05/26/2016 11/26/2014  Falls in the past year? No No   Depression Screen PHQ 2/9  Scores 05/26/2016 05/26/2016 11/26/2014  PHQ - 2 Score 0 0 0  PHQ- 9 Score 0 - -     Cognitive Function     6CIT Screen 05/26/2016  What Year? 0 points  What month? 0 points  What time? 0 points  Count back from 20 0 points  Months in reverse 0 points  Repeat phrase 6 points  Total Score 6    Immunization History  Administered Date(s) Administered  . Influenza, High Dose Seasonal PF 11/26/2014, 09/05/2015  . Pneumococcal Conjugate-13 11/26/2014  . Pneumococcal Polysaccharide-23 09/10/2008, 12/19/2009  . Zoster 11/21/2012   Screening Tests Health Maintenance  Topic Date Due  . FOOT EXAM  [birth year]/1953  . HEMOGLOBIN A1C  05/23/2016  . MAMMOGRAM  05/11/2017 (Originally 04/22/2016)  . COLONOSCOPY  01/11/2026 (Originally 02/08/1993)  . TETANUS/TDAP  01/11/2026 (Originally 02/08/1962)  . Hepatitis C Screening  01/11/2026 (Originally [birth year])  . INFLUENZA VACCINE  08/11/2016  . OPHTHALMOLOGY EXAM  04/11/2017  . DEXA SCAN  Completed  . PNA vac Low Risk Adult  Completed      Plan:  I have personally reviewed and addressed the Medicare Annual Wellness questionnaire and have noted the following in the patient's chart:  A. Medical and social history B. Use of alcohol, tobacco or illicit drugs  C. Current medications and supplements D. Functional ability and status E.  Nutritional status F.  Physical activity G. Advance directives H. List of other physicians I.  Hospitalizations, surgeries, and ER visits in previous 12 months J.  [place] such as hearing and vision if needed, cognitive and depression L. Referrals and appointments - none  In addition, I have reviewed and discussed with patient certain preventive protocols, quality metrics, and best practice recommendations. A written personalized care plan for preventive services as well as general preventive health recommendations were provided to patient.  See attached scanned questionnaire for additional  information.   Signed,  Fabio Neighbors, LPN Nurse Health Advisor   MD Recommendations: Pt declined tetanus vaccine, mammogram, colonoscopy order and Hepatitis C screening today. Pt needs foot exam today.  I have reviewed the health advisor's note, was available for consultation, and agree with documentation and plan  Lelon Huh, MD

## 2016-06-02 ENCOUNTER — Other Ambulatory Visit: Payer: Self-pay | Admitting: Family Medicine

## 2016-06-02 DIAGNOSIS — Z1231 Encounter for screening mammogram for malignant neoplasm of breast: Secondary | ICD-10-CM

## 2016-06-27 ENCOUNTER — Other Ambulatory Visit: Payer: Self-pay | Admitting: Family Medicine

## 2016-07-01 ENCOUNTER — Telehealth: Payer: Self-pay

## 2016-07-01 MED ORDER — LISINOPRIL 10 MG PO TABS: 10.0000 mg | ORAL_TABLET | Freq: Every day | ORAL | 4 refills | Status: DC

## 2016-07-01 NOTE — Telephone Encounter (Signed)
Please advise 

## 2016-07-01 NOTE — Telephone Encounter (Signed)
Patient was notified.

## 2016-07-01 NOTE — Telephone Encounter (Signed)
Pt called concerned because her SBP has been elevated for the last 3-4 weeks. She states it hasn't been above 160, and DBP is around 58. She is taking HCTZ 25 mg and is taking 1/2 tablet of lisinopril. She is asking if it should be increased to a whole tablet. Denies chest pain, H/A, visual changes. States she feels fatigued, but believes this is secondary to recent PNA. Please advise. Renaldo Fiddler, CMA

## 2016-07-01 NOTE — Telephone Encounter (Signed)
Yes she can increase to one full tablet lisinopril.

## 2016-07-28 ENCOUNTER — Ambulatory Visit
Admission: RE | Admit: 2016-07-28 | Discharge: 2016-07-28 | Disposition: A | Discharge status: Home / Self Care | Payer: Medicare Other | Source: Ambulatory Visit | Attending: Family Medicine | Admitting: Family Medicine

## 2016-07-28 ENCOUNTER — Other Ambulatory Visit: Payer: Self-pay | Admitting: Emergency Medicine

## 2016-07-28 DIAGNOSIS — Z780 Asymptomatic menopausal state: Secondary | ICD-10-CM | POA: Diagnosis not present

## 2016-07-28 DIAGNOSIS — M810 Age-related osteoporosis without current pathological fracture: Secondary | ICD-10-CM | POA: Insufficient documentation

## 2016-07-28 DIAGNOSIS — Z1231 Encounter for screening mammogram for malignant neoplasm of breast: Secondary | ICD-10-CM | POA: Diagnosis not present

## 2016-07-28 DIAGNOSIS — Z13820 Encounter for screening for osteoporosis: Secondary | ICD-10-CM | POA: Diagnosis not present

## 2016-07-28 MED ORDER — ALENDRONATE SODIUM 70 MG PO TABS: 70.0000 mg | ORAL_TABLET | ORAL | 4 refills | Status: DC

## 2016-09-14 ENCOUNTER — Telehealth: Payer: Self-pay

## 2016-09-14 NOTE — Telephone Encounter (Signed)
Patient calling to cancel appointment for tomorrow. Appointment canceled. She reports that when she called this morning she was just panicking but doesn't need the appointment.  Thanks,  -Joseline

## 2016-09-15 ENCOUNTER — Ambulatory Visit: Payer: Medicare Other | Admitting: Family Medicine

## 2016-10-27 DIAGNOSIS — H401131 Primary open-angle glaucoma, bilateral, mild stage: Secondary | ICD-10-CM | POA: Diagnosis not present

## 2016-11-14 ENCOUNTER — Other Ambulatory Visit: Payer: Self-pay | Admitting: Family Medicine

## 2016-11-29 ENCOUNTER — Ambulatory Visit (INDEPENDENT_AMBULATORY_CARE_PROVIDER_SITE_OTHER): Payer: Medicare Other | Admitting: Family Medicine

## 2016-11-29 ENCOUNTER — Encounter: Payer: Self-pay | Admitting: Family Medicine

## 2016-11-29 VITALS — BP 142/62 | HR 65 | Temp 98.2°F | Resp 16 | Ht 66.0 in | Wt 147.0 lb

## 2016-11-29 DIAGNOSIS — R7303 Prediabetes: Secondary | ICD-10-CM

## 2016-11-29 DIAGNOSIS — B009 Herpesviral infection, unspecified: Secondary | ICD-10-CM | POA: Diagnosis not present

## 2016-11-29 DIAGNOSIS — Z1211 Encounter for screening for malignant neoplasm of colon: Secondary | ICD-10-CM | POA: Diagnosis not present

## 2016-11-29 DIAGNOSIS — M810 Age-related osteoporosis without current pathological fracture: Secondary | ICD-10-CM

## 2016-11-29 DIAGNOSIS — I10 Essential (primary) hypertension: Secondary | ICD-10-CM

## 2016-11-29 DIAGNOSIS — F419 Anxiety disorder, unspecified: Secondary | ICD-10-CM

## 2016-11-29 LAB — POCT GLYCOSYLATED HEMOGLOBIN (HGB A1C)
ESTIMATED AVERAGE GLUCOSE: 123
HEMOGLOBIN A1C: 5.9

## 2016-11-29 MED ORDER — VALACYCLOVIR HCL 1 G PO TABS: ORAL_TABLET | ORAL | 1 refills | Status: DC

## 2016-11-29 MED ORDER — DIAZEPAM 5 MG PO TABS: 5.0000 mg | ORAL_TABLET | Freq: Four times a day (QID) | ORAL | 5 refills | Status: DC | PRN

## 2016-11-29 NOTE — Progress Notes (Signed)
Patient: Joan Baldwin Female    DOB: January 02, 1946   71 y.o.   MRN: [id number] Visit Date: 11/29/2016  Today's Provider: Lelon Huh, MD   Chief Complaint  Patient presents with  . Hypertension    follow up  . Gastroesophageal Reflux    follow up  . Anxiety    follow up  . Hyperglycemia    follow up   Subjective:    HPI   Hypertension, follow-up:  BP Readings from Last 3 Encounters:  05/26/16 138/82  05/26/16 (!) 148/72  01/26/16 122/72    She was last seen for hypertension 6 months ago.  BP at that visit was 148/72. Management since that visit includes; increased lisinopril to 1 full tablet daily.She reports good compliance with treatment. She is not having side effects.  She is not exercising. She is adherent to low salt diet.   Outside blood pressures are checked 3 times a week and average. She is experiencing none.  Patient denies dyspnea.   Cardiovascular risk factors include advanced age (older than 68 for men, 63 for women) and hypertension.  Use of agents associated with hypertension: NSAIDS.   ------------------------------------------------------------------------   Prediabetes, Follow-up:   Lab Results  Component Value Date   HGBA1C 6.1 05/26/2016   HGBA1C 6.0 11/24/2015   HGBA1C 6.2 05/26/2015   GLUCOSE 106 (H) 01/18/2016   GLUCOSE 186 (H) 01/17/2016   GLUCOSE 121 (H) 11/26/2014    Last seen for for this6 months ago.  Management since that visit includes no changes. Current symptoms include none and have been stable.  Weight trend: decreasing steadily. (22 pound weight loss since last office visit) Prior visit with dietician: no Current diet: in general, a "healthy" diet   Current exercise: none  Pertinent Labs:    Component Value Date/Time   CHOL 168 08/21/2013   TRIG 103 08/21/2013   CREATININE 0.74 01/18/2016 0453    Wt Readings from Last 3 Encounters:  05/26/16 169 lb 3.2 oz (76.7 kg)  01/26/16 167 lb (75.8 kg)    01/17/16 172 lb (78 kg)     Gastroesophageal reflux disease without esophagitis From 05/26/2016-no change. Patient reports good compliance with treatment, good tolerance and good symptom control. Taking omeprazole every day.   Anxiety From 05/26/2016-no changes. Refilled diazepam (VALIUM) 5 MG tablet. Patient reports good compliance with treatment, good tolerance and good symptom control.   She also requests something for recurrent cold sores on lips. Used to get prescription for topical acyclovir but states it is now too expensive..     Allergies  Allergen Reactions  . Codeine Itching  . Ultram  [Tramadol Hcl]     Insomnia     Current Outpatient Medications:  .  acetaminophen (TYLENOL) 500 MG tablet, Take 500 mg by mouth every 8 (eight) hours as needed., Disp: , Rfl:  .  alendronate (FOSAMAX) 70 MG tablet, Take 1 tablet (70 mg total) by mouth every 7 (seven) days. Take with a full glass of water on an empty stomach., Disp: 12 tablet, Rfl: 4 .  aspirin 81 MG tablet, Take 1 tablet by mouth daily., Disp: , Rfl:  .  Calcium Carbonate-Vitamin D (CALCIUM 600/VITAMIN D PO), Take 1 tablet by mouth 2 (two) times daily., Disp: , Rfl:  .  diazepam (VALIUM) 5 MG tablet, Take 1 tablet (5 mg total) by mouth every 6 (six) hours as needed. for anxiety, Disp: 120 tablet, Rfl: 5 .  Fiber POWD, Take 2  scoop by mouth daily., Disp: , Rfl:  .  hydrochlorothiazide (HYDRODIURIL) 25 MG tablet, TAKE ONE TABLET BY MOUTH ONCE DAILY, Disp: 30 tablet, Rfl: 12 .  latanoprost (XALATAN) 0.005 % ophthalmic solution, Place 1 drop into both eyes at bedtime., Disp: , Rfl:  .  lisinopril (PRINIVIL,ZESTRIL) 10 MG tablet, Take 1 tablet (10 mg total) by mouth daily., Disp: 90 tablet, Rfl: 4 .  Multiple Vitamins-Minerals (MULTIVITAMIN ADULT PO), Take 1 tablet by mouth daily., Disp: , Rfl:  .  Omega-3 Fatty Acids (FISH OIL) 1000 MG CAPS, Take 1 capsule by mouth 2 (two) times daily., Disp: , Rfl:  .  omeprazole (PRILOSEC)  20 MG capsule, TAKE ONE CAPSULE BY MOUTH ONCE DAILY, Disp: 30 capsule, Rfl: 12 .  Timolol Maleate 0.5 % (DAILY) SOLN, Place 1 drop into both eyes at bedtime., Disp: , Rfl:   Review of Systems  Constitutional: Negative for appetite change, chills, fatigue and fever.  HENT:       Cold sores above lip, oral pain  Respiratory: Positive for shortness of breath. Negative for chest tightness.   Cardiovascular: Negative for chest pain and palpitations.  Gastrointestinal: Negative for abdominal pain, nausea and vomiting.  Neurological: Negative for dizziness and weakness.    Social History   Tobacco Use  . Smoking status: Former Smoker    Packs/day: 1.00    Years: 30.00    Pack years: 30.00    Types: Cigarettes    Last attempt to quit: 01/11/2001    Years since quitting: 15.8  . Smokeless tobacco: Never Used  Substance Use Topics  . Alcohol use: No    Alcohol/week: 0.0 oz   Objective:   BP (!) 142/62 (BP Location: Left Arm, Patient Position: Sitting, Cuff Size: Normal)   Pulse 65   Temp 98.2 F (36.8 C) (Oral)   Resp 16   Ht 5\' 6"  (1.676 m)   Wt 147 lb (66.7 kg)   SpO2 99% Comment: room air  BMI 23.73 kg/m     Physical Exam   General Appearance:    Alert, cooperative, no distress  Eyes:    PERRL, conjunctiva/corneas clear, EOM's intact       Lungs:     Clear to auscultation bilaterally, respirations unlabored  Heart:    Regular rate and rhythm  Neurologic:   Awake, alert, oriented x 3. No apparent focal neurological           defect.         Results for orders placed or performed in visit on 11/29/16  POCT HgB A1C  Result Value Ref Range   Hemoglobin A1C 5.9    Est. average glucose Bld gHb Est-mCnc 123        Assessment & Plan:     1. Pre-diabetes Well controlled.  Continue current medications.   - POCT HgB A1C  2. Essential (primary) hypertension Well controlled.  Continue current medications.    3. Osteoporosis, unspecified osteoporosis type, unspecified  pathological fracture presence Tolerating Fosamax well   4. Anxiety Refill diazepam (VALIUM) 5 MG tablet; Take 1 tablet (5 mg total) every 6 (six) hours as needed by mouth. for anxiety  Dispense: 120 tablet; Refill: 5  5. HSV-1 (herpes simplex virus 1) infection Change to oral valACYclovir (VALTREX) 1000 MG tablet; 2 tablets twice a day for 1 day as needed for cold sores  Dispense: 16 tablet; Refill: 1  6. Colon cancer screening  - Cologuard   Return in about 6 months (around  05/29/2017).       Lelon Huh, MD  [facility]

## 2016-11-30 ENCOUNTER — Telehealth: Payer: Self-pay | Admitting: Family Medicine

## 2016-11-30 NOTE — Telephone Encounter (Signed)
Order for cologuard faxed to [facility] °

## 2016-12-06 DIAGNOSIS — Z1211 Encounter for screening for malignant neoplasm of colon: Secondary | ICD-10-CM | POA: Diagnosis not present

## 2016-12-07 LAB — COLOGUARD: Cologuard: POSITIVE

## 2016-12-13 ENCOUNTER — Telehealth: Payer: Self-pay | Admitting: Family Medicine

## 2016-12-13 ENCOUNTER — Other Ambulatory Visit: Payer: Self-pay | Admitting: *Deleted

## 2016-12-13 ENCOUNTER — Encounter: Payer: Self-pay | Admitting: Family Medicine

## 2016-12-13 DIAGNOSIS — R195 Other fecal abnormalities: Secondary | ICD-10-CM

## 2016-12-13 NOTE — Telephone Encounter (Signed)
Pt states she would like to speak with Dr Caryn Section today about her test results.  TM#[phone]

## 2016-12-13 NOTE — Progress Notes (Unsigned)
Please schedule referral to GI? Thanks!

## 2016-12-13 NOTE — Telephone Encounter (Signed)
Please advise 

## 2016-12-14 ENCOUNTER — Encounter: Payer: Self-pay | Admitting: Family Medicine

## 2016-12-14 NOTE — Telephone Encounter (Signed)
Pt called back again today wanting to speak to someone about her results.  States she is worried about the positive result.

## 2016-12-14 NOTE — Telephone Encounter (Signed)
Patient advised via MyChart. Needs GI referral.

## 2016-12-16 DIAGNOSIS — R195 Other fecal abnormalities: Secondary | ICD-10-CM | POA: Diagnosis not present

## 2016-12-16 DIAGNOSIS — K648 Other hemorrhoids: Secondary | ICD-10-CM | POA: Diagnosis not present

## 2016-12-16 NOTE — Telephone Encounter (Signed)
error 

## 2016-12-23 DIAGNOSIS — D122 Benign neoplasm of ascending colon: Secondary | ICD-10-CM | POA: Diagnosis not present

## 2016-12-23 DIAGNOSIS — K640 First degree hemorrhoids: Secondary | ICD-10-CM | POA: Diagnosis not present

## 2016-12-23 DIAGNOSIS — Z1211 Encounter for screening for malignant neoplasm of colon: Secondary | ICD-10-CM | POA: Diagnosis not present

## 2016-12-23 DIAGNOSIS — K648 Other hemorrhoids: Secondary | ICD-10-CM | POA: Diagnosis not present

## 2016-12-23 DIAGNOSIS — R195 Other fecal abnormalities: Secondary | ICD-10-CM | POA: Diagnosis not present

## 2016-12-23 DIAGNOSIS — K635 Polyp of colon: Secondary | ICD-10-CM | POA: Diagnosis not present

## 2016-12-23 DIAGNOSIS — D126 Benign neoplasm of colon, unspecified: Secondary | ICD-10-CM | POA: Diagnosis not present

## 2016-12-23 LAB — HM COLONOSCOPY

## 2016-12-29 ENCOUNTER — Encounter: Payer: Self-pay | Admitting: Family Medicine

## 2016-12-29 DIAGNOSIS — Z86010 Personal history of colonic polyps: Secondary | ICD-10-CM | POA: Insufficient documentation

## 2017-01-01 ENCOUNTER — Encounter: Payer: Self-pay | Admitting: Family Medicine

## 2017-01-05 ENCOUNTER — Telehealth: Payer: Self-pay | Admitting: Family Medicine

## 2017-01-05 NOTE — Telephone Encounter (Signed)
Please advise 

## 2017-01-05 NOTE — Telephone Encounter (Signed)
She had one pre-cancerous polyp that was completely removed. Sometimes these will grow back after 4-5 years, so she will need a follow up colonoscopy in 4-5 years.

## 2017-01-05 NOTE — Telephone Encounter (Signed)
Advised patient as below.  

## 2017-01-05 NOTE — Telephone Encounter (Signed)
Pt wanting result of colonoscopy.  Was advised to contact GI since they are the ones that did the test.  States she did but was on hold to 30 mins.

## 2017-01-16 ENCOUNTER — Other Ambulatory Visit: Payer: Self-pay | Admitting: Family Medicine

## 2017-01-24 ENCOUNTER — Encounter: Payer: Self-pay | Admitting: Family Medicine

## 2017-04-28 DIAGNOSIS — H401131 Primary open-angle glaucoma, bilateral, mild stage: Secondary | ICD-10-CM | POA: Diagnosis not present

## 2017-05-31 ENCOUNTER — Ambulatory Visit (INDEPENDENT_AMBULATORY_CARE_PROVIDER_SITE_OTHER): Payer: Medicare Other

## 2017-05-31 ENCOUNTER — Ambulatory Visit (INDEPENDENT_AMBULATORY_CARE_PROVIDER_SITE_OTHER): Payer: Medicare Other | Admitting: Family Medicine

## 2017-05-31 VITALS — BP 164/66 | HR 68 | Temp 98.8°F | Ht 66.0 in | Wt 137.4 lb

## 2017-05-31 VITALS — BP 134/70

## 2017-05-31 DIAGNOSIS — Z0000 Encounter for general adult medical examination without abnormal findings: Secondary | ICD-10-CM | POA: Diagnosis not present

## 2017-05-31 DIAGNOSIS — R7303 Prediabetes: Secondary | ICD-10-CM

## 2017-05-31 DIAGNOSIS — I10 Essential (primary) hypertension: Secondary | ICD-10-CM | POA: Diagnosis not present

## 2017-05-31 DIAGNOSIS — M810 Age-related osteoporosis without current pathological fracture: Secondary | ICD-10-CM | POA: Diagnosis not present

## 2017-05-31 NOTE — Progress Notes (Signed)
Patient: Joan Baldwin, Female    DOB: 07-Sep-1945, 72 y.o.   MRN: [id number] Visit Date: 05/31/2017  Today's Provider: Lelon Huh, MD   No chief complaint on file.  Subjective:   Patient saw [facility] for AWV today at 9:20 am.   Complete Physical Natalie S Haberer is a 72 y.o. female. She feels well. She reports exercising none. She reports she is sleeping fairly well.  -----------------------------------------------------------   Hypertension, follow-up:  BP Readings from Last 3 Encounters:  05/31/17 (!) 164/66  11/29/16 (!) 142/62  05/26/16 138/82    She was last seen for hypertension 6 months ago.  BP at that visit was 142/62. Management since that visit includes; no changes.She reports good compliance with treatment. She is not having side effects. none She is not exercising. She is adherent to low salt diet.   Outside blood pressures are 118/60. She is  none.  Patient denies none.   Cardiovascular risk factors include advanced age (older than 26 for men, 60 for women).  Use of agents associated with hypertension: none.   ------------------------------------------------------------  Pre-diabetes From 11/29/2016-no changes. HgB A1C 5.9.  Osteoporosis, unspecified osteoporosis type, unspecified pathological fracture presence From 11/29/2016-no changes. Was started on alendronate in Julyy of 2018 and tolerating well.  Anxiety From 11/29/2016-no changes. Refilled diazepam (VALIUM) 5 MG tablet.doing well.    Review of Systems  Constitutional: Negative.   HENT: Negative.   Eyes: Positive for redness and itching.  Respiratory: Negative.   Cardiovascular: Negative.   Gastrointestinal: Positive for constipation.  Endocrine: Negative.   Genitourinary: Negative.   Musculoskeletal: Negative.   Skin: Negative.   Allergic/Immunologic: Positive for environmental allergies.  Neurological: Negative.   Hematological: Bruises/bleeds easily.    Psychiatric/Behavioral: The patient is nervous/anxious.     Social History   Socioeconomic History  . Marital status: Married    Spouse name: Not on file  . Number of children: 3  . Years of education: Not on file  . Highest education level: 12th grade  Occupational History  . Occupation: Retired  Scientific laboratory technician  . Financial resource strain: Not hard at all  . Food insecurity:    Worry: Never true    Inability: Never true  . Transportation needs:    Medical: No    Non-medical: No  Tobacco Use  . Smoking status: Former Smoker    Packs/day: 1.00    Years: 30.00    Pack years: 30.00    Types: Cigarettes    Last attempt to quit: 01/11/2001    Years since quitting: 16.3  . Smokeless tobacco: Never Used  Substance and Sexual Activity  . Alcohol use: No    Alcohol/week: 0.0 oz  . Drug use: No  . Sexual activity: Not on file  Lifestyle  . Physical activity:    Days per week: Not on file    Minutes per session: Not on file  . Stress: Not at all  Relationships  . Social connections:    Talks on phone: Not on file    Gets together: Not on file    Attends religious service: Not on file    Active member of club or organization: Not on file    Attends meetings of clubs or organizations: Not on file    Relationship status: Not on file  . Intimate partner violence:    Fear of current or ex partner: Not on file    Emotionally abused: Not on file  Physically abused: Not on file    Forced sexual activity: Not on file  Other Topics Concern  . Not on file  Social History Narrative  . Not on file    Past Medical History:  Diagnosis Date  . Cataract   . Glaucoma   . History of chicken pox   . Hypertension   . Osteoporosis   . Sepsis ([place]) 01/17/2016     Patient Active Problem List   Diagnosis Date Noted  . History of adenomatous polyp of colon 12/29/2016  . History of smoking 30 or more pack years 05/26/2016  . Esophageal abnormality 02/03/2016  . Glaucoma 11/22/2014   . Skin lesion 11/22/2014  . Osteoporosis 02/13/2013  . Pre-diabetes 08/10/2009  . Essential (primary) hypertension 05/06/2006  . Anxiety 01/11/2001  . Insomnia 01/11/2001  . GERD (gastroesophageal reflux disease) 01/11/2001    Past Surgical History:  Procedure Laterality Date  . ABDOMINAL HYSTERECTOMY     vaginal. Still has both ovaries  . back surgeries     x 2  . COLONOSCOPY  2007   done in [place] per patient. Normal except for moderate hemorrhoids  . MOUTH SURGERY  2011  . STOMACH SURGERY     staples for obesity  . Tailbone surgery      Her family history includes Diabetes in her brother; Emphysema in her mother; Heart Problems in her brother; Kidney failure in her sister; Skin cancer in her brother. There is no history of Breast cancer.      Current Outpatient Medications:  .  acetaminophen (TYLENOL) 500 MG tablet, Take 500 mg by mouth every 8 (eight) hours as needed. , Disp: , Rfl:  .  alendronate (FOSAMAX) 70 MG tablet, Take 1 tablet (70 mg total) by mouth every 7 (seven) days. Take with a full glass of water on an empty stomach., Disp: 12 tablet, Rfl: 4 .  aspirin 81 MG tablet, Take 1 tablet by mouth daily., Disp: , Rfl:  .  Calcium Carbonate-Vitamin D (CALCIUM 600/VITAMIN D PO), Take 1 tablet by mouth daily. , Disp: , Rfl:  .  diazepam (VALIUM) 5 MG tablet, Take 1 tablet (5 mg total) every 6 (six) hours as needed by mouth. for anxiety, Disp: 120 tablet, Rfl: 5 .  Fiber POWD, Take 2 scoop by mouth daily., Disp: , Rfl:  .  hydrochlorothiazide (HYDRODIURIL) 25 MG tablet, TAKE ONE TABLET BY MOUTH ONCE DAILY, Disp: 90 tablet, Rfl: 4 .  ibuprofen (ADVIL,MOTRIN) 200 MG tablet, Take 200 mg by mouth every 6 (six) hours as needed., Disp: , Rfl:  .  latanoprost (XALATAN) 0.005 % ophthalmic solution, Place 1 drop into both eyes at bedtime., Disp: , Rfl:  .  lisinopril (PRINIVIL,ZESTRIL) 10 MG tablet, Take 1 tablet (10 mg total) by mouth daily., Disp: 90 tablet, Rfl: 4 .   Multiple Vitamins-Minerals (MULTIVITAMIN ADULT PO), Take 1 tablet by mouth daily., Disp: , Rfl:  .  Omega-3 Fatty Acids (FISH OIL) 1000 MG CAPS, Take 1 capsule by mouth 2 (two) times daily., Disp: , Rfl:  .  omeprazole (PRILOSEC) 20 MG capsule, TAKE ONE CAPSULE BY MOUTH ONCE DAILY, Disp: 30 capsule, Rfl: 12 .  Timolol Maleate 0.5 % (DAILY) SOLN, Place 1 drop into both eyes at bedtime., Disp: , Rfl:  .  valACYclovir (VALTREX) 1000 MG tablet, 2 tablets twice a day for 1 day as needed for cold sores, Disp: 16 tablet, Rfl: 1  Patient Care Team: Birdie Sons, MD as PCP -  General (Family Medicine) Birder Robson, MD as Referring Physician (Ophthalmology)     Objective:   Vitals:   BP 164/66 (BP Location: Right Arm)    Pulse 68    Temp 98.8 F (37.1 C) (Oral)    Ht 5\' 6"  (1.676 m)    Wt 137 lb 6.4 oz (62.3 kg)    BMI 22.18 kg/m    BSA 1.7 m    Physical Exam   General Appearance:    Alert, cooperative, no distress, appears stated age  Head:    Normocephalic, without obvious abnormality, atraumatic  Eyes:    PERRL, conjunctiva/corneas clear, EOM's intact, fundi    benign, both eyes  Ears:    Normal TM's and external ear canals, both ears  Nose:   Nares normal, septum midline, mucosa normal, no drainage    or sinus tenderness  Throat:   Lips, mucosa, and tongue normal; teeth and gums normal  Neck:   Supple, symmetrical, trachea midline, no adenopathy;    thyroid:  no enlargement/tenderness/nodules; no carotid   bruit or JVD  Back:     Symmetric, no curvature, ROM normal, no CVA tenderness  Lungs:     Clear to auscultation bilaterally, respirations unlabored  Chest Wall:    No tenderness or deformity   Heart:    Regular rate and rhythm, S1 and S2 normal, no murmur, rub   or gallop  Breast Exam:    normal appearance, no masses or tenderness  Abdomen:     Soft, non-tender, bowel sounds active all four quadrants,    no masses, no organomegaly  Pelvic:    deferred    Extremities:   Extremities normal, atraumatic, no cyanosis or edema  Pulses:   2+ and symmetric all extremities  Skin:   Skin color, texture, turgor normal, no rashes or lesions  Lymph nodes:   Cervical, supraclavicular, and axillary nodes normal  Neurologic:   CNII-XII intact, normal strength, sensation and reflexes    throughout    Activities of Daily Living In your present state of health, do you have any difficulty performing the following activities: 05/31/2017  Hearing? N  Vision? N  Difficulty concentrating or making decisions? Y  Walking or climbing stairs? N  Dressing or bathing? N  Doing errands, shopping? N  Preparing Food and eating ? N  Using the Toilet? N  In the past six months, have you accidently leaked urine? N  Do you have problems with loss of bowel control? N  Managing your Medications? N  Managing your Finances? N  Housekeeping or managing your Housekeeping? N  Some recent data might be hidden    Fall Risk Assessment Fall Risk  05/31/2017 05/26/2016 11/26/2014  Falls in the past year? No No No     Depression Screen PHQ 2/9 Scores 05/31/2017 05/26/2016 05/26/2016 11/26/2014  PHQ - 2 Score 1 0 0 0  PHQ- 9 Score - 0 - -       Assessment & Plan:    Annual Physical Reviewed patient's Family Medical History Reviewed and updated list of patient's medical providers Assessment of cognitive impairment was done Assessed patient's functional ability Established a written schedule for health screening [place] Completed and Reviewed  Exercise Activities and Dietary recommendations Goals    . DIET - INCREASE WATER INTAKE     Recommend increasing water intake to 4-6 glasses a day.     . Exercise 3x per week (30 min per time)  Recommend exercising 4 days a week for 30 minutes. Pt plans to do this once feeling better.        Immunization History  Administered Date(s) Administered  . Influenza, High Dose Seasonal PF 11/26/2014,  09/05/2015, 10/10/2016  . Pneumococcal Conjugate-13 11/26/2014  . Pneumococcal Polysaccharide-23 09/10/2008, 12/19/2009  . Zoster 11/21/2012    Health Maintenance  Topic Date Due  . FOOT EXAM  02/08/1953  . TETANUS/TDAP  02/08/1962  . HEMOGLOBIN A1C  05/29/2017  . Hepatitis C Screening  06/16/2017 (Originally 1945/05/26)  . INFLUENZA VACCINE  08/11/2017  . OPHTHALMOLOGY EXAM  04/12/2018  . MAMMOGRAM  07/29/2018  . DEXA SCAN  07/29/2018  . COLONOSCOPY  12/23/2021  . PNA vac Low Risk Adult  Completed     Discussed health benefits of physical activity, and encouraged her to engage in regular exercise appropriate for her age and condition.    -------------------------------------------------------------------    Lelon Huh, MD  [place]

## 2017-05-31 NOTE — Patient Instructions (Addendum)
   The CDC recommends two doses of Shingrix (the shingles vaccine) separated by 2 to 6 months for adults age 74 years and older. I recommend checking with your pharmacy plan regarding coverage for this vaccine.     

## 2017-05-31 NOTE — Patient Instructions (Addendum)
Joan Baldwin , Thank you for taking time to come for your Medicare Wellness Visit. I appreciate your ongoing commitment to your health goals. Please review the following plan we discussed and let me know if I can assist you in the future.   Screening recommendations/referrals: Colonoscopy: Up to date Mammogram: Up to date Bone Density: Up to date Recommended yearly ophthalmology/optometry visit for glaucoma screening and checkup Recommended yearly dental visit for hygiene and checkup  Vaccinations: Influenza vaccine: Up to date Pneumococcal vaccine: Up to date Tdap vaccine: Pt declines today.  Shingles vaccine: Pt declines today.     Advanced directives: Please bring a copy of your POA (Power of Attorney) and/or Living Will to your next appointment.   Conditions/risks identified: Recommend increasing water intake to 4-6 glasses a day.   Next appointment: 10:00 AM today with Dr Caryn Section.    Preventive Care 74 Years and Older, Female Preventive care refers to lifestyle choices and visits with your health care provider that can promote health and wellness. What does preventive care include?  A yearly physical exam. This is also called an annual well check.  Dental exams once or twice a year.  Routine eye exams. Ask your health care provider how often you should have your eyes checked.  Personal lifestyle choices, including:  Daily care of your teeth and gums.  Regular physical activity.  Eating a healthy diet.  Avoiding tobacco and drug use.  Limiting alcohol use.  Practicing safe sex.  Taking low-dose aspirin every day.  Taking vitamin and mineral supplements as recommended by your health care provider. What happens during an annual well check? The services and screenings done by your health care provider during your annual well check will depend on your age, overall health, lifestyle risk factors, and family history of disease. Counseling  Your health care provider  may ask you questions about your:  Alcohol use.  Tobacco use.  Drug use.  Emotional well-being.  Home and relationship well-being.  Sexual activity.  Eating habits.  History of falls.  Memory and ability to understand (cognition).  Work and work Statistician.  Reproductive health. Screening  You may have the following tests or measurements:  Height, weight, and BMI.  Blood pressure.  Lipid and cholesterol levels. These may be checked every 5 years, or more frequently if you are over 74 years old.  Skin check.  Lung cancer screening. You may have this screening every year starting at age 74 if you have a 30-pack-year history of smoking and currently smoke or have quit within the past 15 years.  Fecal occult blood test (FOBT) of the stool. You may have this test every year starting at age 74.  Flexible sigmoidoscopy or colonoscopy. You may have a sigmoidoscopy every 5 years or a colonoscopy every 10 years starting at age 57.  Hepatitis C blood test.  Hepatitis B blood test.  Sexually transmitted disease (STD) testing.  Diabetes screening. This is done by checking your blood sugar (glucose) after you have not eaten for a while (fasting). You may have this done every 1-3 years.  Bone density scan. This is done to screen for osteoporosis. You may have this done starting at age 74.  Mammogram. This may be done every 1-2 years. Talk to your health care provider about how often you should have regular mammograms. Talk with your health care provider about your test results, treatment options, and if necessary, the need for more tests. Vaccines  Your health care provider may  recommend certain vaccines, such as:  Influenza vaccine. This is recommended every year.  Tetanus, diphtheria, and acellular pertussis (Tdap, Td) vaccine. You may need a Td booster every 10 years.  Zoster vaccine. You may need this after age 10.  Pneumococcal 13-valent conjugate (PCV13) vaccine.  One dose is recommended after age 74.  Pneumococcal polysaccharide (PPSV23) vaccine. One dose is recommended after age 74. Talk to your health care provider about which screenings and vaccines you need and how often you need them. This information is not intended to replace advice given to you by your health care provider. Make sure you discuss any questions you have with your health care provider. Document Released: 01/24/2015 Document Revised: 09/17/2015 Document Reviewed: 10/29/2014 Elsevier Interactive Patient Education  2017 [place] Prevention in the Home Falls can cause injuries. They can happen to people of all ages. There are many things you can do to make your home safe and to help prevent falls. What can I do on the outside of my home?  Regularly fix the edges of walkways and driveways and fix any cracks.  Remove anything that might make you trip as you walk through a door, such as a raised step or threshold.  Trim any bushes or trees on the path to your home.  Use bright outdoor lighting.  Clear any walking paths of anything that might make someone trip, such as rocks or tools.  Regularly check to see if handrails are loose or broken. Make sure that both sides of any steps have handrails.  Any raised decks and porches should have guardrails on the edges.  Have any leaves, snow, or ice cleared regularly.  Use sand or salt on walking paths during winter.  Clean up any spills in your garage right away. This includes oil or grease spills. What can I do in the bathroom?  Use night lights.  Install grab bars by the toilet and in the tub and shower. Do not use towel bars as grab bars.  Use non-skid mats or decals in the tub or shower.  If you need to sit down in the shower, use a plastic, non-slip stool.  Keep the floor dry. Clean up any water that spills on the floor as soon as it happens.  Remove soap buildup in the tub or shower regularly.  Attach bath  mats securely with double-sided non-slip rug tape.  Do not have throw rugs and other things on the floor that can make you trip. What can I do in the bedroom?  Use night lights.  Make sure that you have a light by your bed that is easy to reach.  Do not use any sheets or blankets that are too big for your bed. They should not hang down onto the floor.  Have a firm chair that has side arms. You can use this for support while you get dressed.  Do not have throw rugs and other things on the floor that can make you trip. What can I do in the kitchen?  Clean up any spills right away.  Avoid walking on wet floors.  Keep items that you use a lot in easy-to-reach places.  If you need to reach something above you, use a strong step stool that has a grab bar.  Keep electrical cords out of the way.  Do not use floor polish or wax that makes floors slippery. If you must use wax, use non-skid floor wax.  Do not have throw rugs and  other things on the floor that can make you trip. What can I do with my stairs?  Do not leave any items on the stairs.  Make sure that there are handrails on both sides of the stairs and use them. Fix handrails that are broken or loose. Make sure that handrails are as long as the stairways.  Check any carpeting to make sure that it is firmly attached to the stairs. Fix any carpet that is loose or worn.  Avoid having throw rugs at the top or bottom of the stairs. If you do have throw rugs, attach them to the floor with carpet tape.  Make sure that you have a light switch at the top of the stairs and the bottom of the stairs. If you do not have them, ask someone to add them for you. What else can I do to help prevent falls?  Wear shoes that:  Do not have high heels.  Have rubber bottoms.  Are comfortable and fit you well.  Are closed at the toe. Do not wear sandals.  If you use a stepladder:  Make sure that it is fully opened. Do not climb a closed  stepladder.  Make sure that both sides of the stepladder are locked into place.  Ask someone to hold it for you, if possible.  Clearly mark and make sure that you can see:  Any grab bars or handrails.  First and last steps.  Where the edge of each step is.  Use tools that help you move around (mobility aids) if they are needed. These include:  Canes.  Walkers.  Scooters.  Crutches.  Turn on the lights when you go into a dark area. Replace any light bulbs as soon as they burn out.  Set up your furniture so you have a clear path. Avoid moving your furniture around.  If any of your floors are uneven, fix them.  If there are any pets around you, be aware of where they are.  Review your medicines with your doctor. Some medicines can make you feel dizzy. This can increase your chance of falling. Ask your doctor what other things that you can do to help prevent falls. This information is not intended to replace advice given to you by your health care provider. Make sure you discuss any questions you have with your health care provider. Document Released: 10/24/2008 Document Revised: 06/05/2015 Document Reviewed: 02/01/2014 Elsevier Interactive Patient Education  2017 [employer].

## 2017-05-31 NOTE — Progress Notes (Signed)
Subjective:   Joan Baldwin is a 72 y.o. female who presents for Medicare Annual (Subsequent) preventive examination.  Review of Systems:  N/A  Cardiac Risk Factors include: advanced age (>47men, >73 women);hypertension     Objective:     Vitals: BP (!) 164/66 (BP Location: Right Arm)   Pulse 68   Temp 98.8 F (37.1 C) (Oral)   Ht 5\' 6"  (1.676 m)   Wt 137 lb 6.4 oz (62.3 kg)   BMI 22.18 kg/m   Body mass index is 22.18 kg/m.  Advanced Directives 05/31/2017 05/26/2016 01/17/2016 01/17/2016  Does Patient Have a Medical Advance Directive? No No No No  Would patient like information on creating a medical advance directive? Yes (MAU/Ambulatory/Procedural Areas - Information given) Yes (ED - Information included in AVS) No - Patient declined -    Tobacco Social History   Tobacco Use  Smoking Status Former Smoker  . Packs/day: 1.00  . Years: 30.00  . Pack years: 30.00  . Types: Cigarettes  . Last attempt to quit: 01/11/2001  . Years since quitting: 16.3  Smokeless Tobacco Never Used     Counseling given: Not Answered   Clinical Intake:  Pre-visit preparation completed: Yes  Pain : No/denies pain Pain Score: 0-No pain     Nutritional Status: BMI of 19-24  Normal Nutritional Risks: None Diabetes: No  How often do you need to have someone help you when you read instructions, pamphlets, or other written materials from your doctor or pharmacy?: 1 - Never  Interpreter Needed?: No  Information entered by :: [facility], LPN   Past Medical History:  Diagnosis Date  . Cataract   . Glaucoma   . History of chicken pox   . Hypertension   . Osteoporosis   . Sepsis ([place]) 01/17/2016   Past Surgical History:  Procedure Laterality Date  . ABDOMINAL HYSTERECTOMY     vaginal. Still has both ovaries  . back surgeries     x 2  . COLONOSCOPY  2007   done in [place] per patient. Normal except for moderate hemorrhoids  . MOUTH SURGERY  2011  . STOMACH SURGERY     staples for obesity  . Tailbone surgery     Family History  Problem Relation Age of Onset  . Emphysema Mother   . Kidney failure Sister   . Heart Problems Brother   . Diabetes Brother   . Skin cancer Brother   . Breast cancer Neg Hx    Social History   Socioeconomic History  . Marital status: Married    Spouse name: Not on file  . Number of children: 3  . Years of education: Not on file  . Highest education level: 12th grade  Occupational History  . Occupation: Retired  Scientific laboratory technician  . Financial resource strain: Not hard at all  . Food insecurity:    Worry: Never true    Inability: Never true  . Transportation needs:    Medical: No    Non-medical: No  Tobacco Use  . Smoking status: Former Smoker    Packs/day: 1.00    Years: 30.00    Pack years: 30.00    Types: Cigarettes    Last attempt to quit: 01/11/2001    Years since quitting: 16.3  . Smokeless tobacco: Never Used  Substance and Sexual Activity  . Alcohol use: No    Alcohol/week: 0.0 oz  . Drug use: No  . Sexual activity: Not on file  Lifestyle  .  Physical activity:    Days per week: Not on file    Minutes per session: Not on file  . Stress: Not at all  Relationships  . Social connections:    Talks on phone: Not on file    Gets together: Not on file    Attends religious service: Not on file    Active member of club or organization: Not on file    Attends meetings of clubs or organizations: Not on file    Relationship status: Not on file  Other Topics Concern  . Not on file  Social History Narrative  . Not on file    Outpatient Encounter Medications as of 05/31/2017  Medication Sig  . acetaminophen (TYLENOL) 500 MG tablet Take 500 mg by mouth every 8 (eight) hours as needed.   Marland Kitchen alendronate (FOSAMAX) 70 MG tablet Take 1 tablet (70 mg total) by mouth every 7 (seven) days. Take with a full glass of water on an empty stomach.  Marland Kitchen aspirin 81 MG tablet Take 1 tablet by mouth daily.  . Calcium  Carbonate-Vitamin D (CALCIUM 600/VITAMIN D PO) Take 1 tablet by mouth daily.   . diazepam (VALIUM) 5 MG tablet Take 1 tablet (5 mg total) every 6 (six) hours as needed by mouth. for anxiety  . Fiber POWD Take 2 scoop by mouth daily.  . hydrochlorothiazide (HYDRODIURIL) 25 MG tablet TAKE ONE TABLET BY MOUTH ONCE DAILY  . ibuprofen (ADVIL,MOTRIN) 200 MG tablet Take 200 mg by mouth every 6 (six) hours as needed.  . latanoprost (XALATAN) 0.005 % ophthalmic solution Place 1 drop into both eyes at bedtime.  Marland Kitchen lisinopril (PRINIVIL,ZESTRIL) 10 MG tablet Take 1 tablet (10 mg total) by mouth daily.  . Multiple Vitamins-Minerals (MULTIVITAMIN ADULT PO) Take 1 tablet by mouth daily.  . Omega-3 Fatty Acids (FISH OIL) 1000 MG CAPS Take 1 capsule by mouth 2 (two) times daily.  Marland Kitchen omeprazole (PRILOSEC) 20 MG capsule TAKE ONE CAPSULE BY MOUTH ONCE DAILY  . Timolol Maleate 0.5 % (DAILY) SOLN Place 1 drop into both eyes at bedtime.  . valACYclovir (VALTREX) 1000 MG tablet 2 tablets twice a day for 1 day as needed for cold sores   No facility-administered encounter medications on file as of 05/31/2017.     Activities of Daily Living In your present state of health, do you have any difficulty performing the following activities: 05/31/2017  Hearing? N  Vision? N  Difficulty concentrating or making decisions? Y  Walking or climbing stairs? N  Dressing or bathing? N  Doing errands, shopping? N  Preparing Food and eating ? N  Using the Toilet? N  In the past six months, have you accidently leaked urine? N  Do you have problems with loss of bowel control? N  Managing your Medications? N  Managing your Finances? N  Housekeeping or managing your Housekeeping? N  Some recent data might be hidden    Patient Care Team: Birdie Sons, MD as PCP - General (Family Medicine) Birder Robson, MD as Referring Physician (Ophthalmology)    Assessment:   This is a routine wellness examination for  Joan Baldwin.  Exercise Activities and Dietary recommendations Current Exercise Habits: The patient does not participate in regular exercise at present(Does yardwork. ), Exercise limited by: None identified  Goals    . DIET - INCREASE WATER INTAKE     Recommend increasing water intake to 4-6 glasses a day.     . Exercise 3x per week (30 min per  time)     Recommend exercising 4 days a week for 30 minutes. Pt plans to do this once feeling better.        Fall Risk Fall Risk  05/31/2017 05/26/2016 11/26/2014  Falls in the past year? No No No   Is the patient's home free of loose throw rugs in walkways, pet beds, electrical cords, etc?   yes      Grab bars in the bathroom? yes      Handrails on the stairs?   yes      Adequate lighting?   yes  Timed Get Up and Go performed: N/A  Depression Screen PHQ 2/9 Scores 05/31/2017 05/26/2016 05/26/2016 11/26/2014  PHQ - 2 Score 1 0 0 0  PHQ- 9 Score - 0 - -     Cognitive Function: Pt declined screening today.      6CIT Screen 05/26/2016  What Year? 0 points  What month? 0 points  What time? 0 points  Count back from 20 0 points  Months in reverse 0 points  Repeat phrase 6 points  Total Score 6    Immunization History  Administered Date(s) Administered  . Influenza, High Dose Seasonal PF 11/26/2014, 09/05/2015, 10/10/2016  . Pneumococcal Conjugate-13 11/26/2014  . Pneumococcal Polysaccharide-23 09/10/2008, 12/19/2009  . Zoster 11/21/2012    Qualifies for Shingles Vaccine? Due for Shingles vaccine. Declined my offer to administer today. Education has been provided regarding the importance of this vaccine. Pt has been advised to call her insurance company to determine her out of pocket expense. Advised she may also receive this vaccine at her local pharmacy or Health Dept. Verbalized acceptance and understanding.  Screening Tests Health Maintenance  Topic Date Due  . FOOT EXAM  02/08/1953  . TETANUS/TDAP  02/08/1962  . HEMOGLOBIN A1C   05/29/2017  . Hepatitis C Screening  06/16/2017 (Originally 11-14-1945)  . INFLUENZA VACCINE  08/11/2017  . OPHTHALMOLOGY EXAM  04/12/2018  . MAMMOGRAM  07/29/2018  . DEXA SCAN  07/29/2018  . COLONOSCOPY  12/23/2021  . PNA vac Low Risk Adult  Completed    Cancer Screenings: Lung: Low Dose CT Chest recommended if Age 66-80 years, 30 pack-year currently smoking OR have quit w/in 15years. Patient does not qualify. Breast:  Up to date on Mammogram? Yes   Up to date of Bone Density/Dexa? Yes Colorectal: Up to date  Additional Screenings:  Hepatitis C Screening: Pt declines today.      Plan:  I have personally reviewed and addressed the Medicare Annual Wellness questionnaire and have noted the following in the patient's chart:  A. Medical and social history B. Use of alcohol, tobacco or illicit drugs  C. Current medications and supplements D. Functional ability and status E.  Nutritional status F.  Physical activity G. Advance directives H. List of other physicians I.  Hospitalizations, surgeries, and ER visits in previous 12 months [place] such as hearing and vision if needed, cognitive and depression L. Referrals and appointments - none  In addition, I have reviewed and discussed with patient certain preventive protocols, quality metrics, and best practice recommendations. A written personalized care plan for preventive services as well as general preventive health recommendations were provided to patient.  See attached scanned questionnaire for additional information.   Signed,  Fabio Neighbors, LPN Nurse Health Advisor   Nurse Recommendations: Pt declined the tetanus vaccine and Hep C lab screening. Per HM pt needs a diabetic foot exam and Hgb A1c checked. Pt  states she is prediabetic.

## 2017-06-01 ENCOUNTER — Encounter: Payer: Self-pay | Admitting: Family Medicine

## 2017-06-01 ENCOUNTER — Telehealth: Payer: Self-pay | Admitting: Family Medicine

## 2017-06-01 DIAGNOSIS — I10 Essential (primary) hypertension: Secondary | ICD-10-CM

## 2017-06-01 LAB — COMPREHENSIVE METABOLIC PANEL
ALBUMIN: 4.4 g/dL (ref 3.5–4.8)
ALK PHOS: 28 IU/L — AB (ref 39–117)
ALT: 16 IU/L (ref 0–32)
AST: 26 IU/L (ref 0–40)
Albumin/Globulin Ratio: 1.7 (ref 1.2–2.2)
BILIRUBIN TOTAL: 0.2 mg/dL (ref 0.0–1.2)
BUN / CREAT RATIO: 11 — AB (ref 12–28)
BUN: 8 mg/dL (ref 8–27)
CO2: 23 mmol/L (ref 20–29)
Calcium: 9.3 mg/dL (ref 8.7–10.3)
Chloride: 89 mmol/L — ABNORMAL LOW (ref 96–106)
Creatinine, Ser: 0.7 mg/dL (ref 0.57–1.00)
GFR calc Af Amer: 99 mL/min/{1.73_m2} (ref >59)
GFR calc non Af Amer: 86 mL/min/{1.73_m2} (ref >59)
GLOBULIN, TOTAL: 2.6 g/dL (ref 1.5–4.5)
GLUCOSE: 106 mg/dL — AB (ref 65–99)
Potassium: 4.4 mmol/L (ref 3.5–5.2)
SODIUM: 130 mmol/L — AB (ref 134–144)
Total Protein: 7 g/dL (ref 6.0–8.5)

## 2017-06-01 LAB — LIPID PANEL
CHOLESTEROL TOTAL: 180 mg/dL (ref 100–199)
Chol/HDL Ratio: 3.6 ratio (ref 0.0–4.4)
HDL: 50 mg/dL (ref >39)
LDL Calculated: 112 mg/dL — ABNORMAL HIGH (ref 0–99)
Triglycerides: 89 mg/dL (ref 0–149)
VLDL Cholesterol Cal: 18 mg/dL (ref 5–40)

## 2017-06-01 LAB — HEMOGLOBIN A1C
ESTIMATED AVERAGE GLUCOSE: 117 mg/dL
Hgb A1c MFr Bld: 5.7 % — ABNORMAL HIGH (ref 4.8–5.6)

## 2017-06-01 NOTE — Telephone Encounter (Signed)
Patient called again requesting lab results.

## 2017-06-01 NOTE — Telephone Encounter (Signed)
-----   Message from Birdie Sons, MD sent at 06/01/2017  3:57 PM EDT ----- Blood sugar is good. a1c is 5.7. Sodium level is low. Need to change hctz to amlodipine 5mg  once a day for blood pressure, #30, rf x 0. Follow up o.v. 4 weeks.

## 2017-06-01 NOTE — Telephone Encounter (Signed)
Pt called asking for lab results from yesterday.  Pt's call back [phone].  Thanks [employer]

## 2017-06-01 NOTE — Telephone Encounter (Signed)
NA

## 2017-06-01 NOTE — Telephone Encounter (Signed)
Please advise results? 

## 2017-06-02 ENCOUNTER — Telehealth: Payer: Self-pay | Admitting: Family Medicine

## 2017-06-02 MED ORDER — AMLODIPINE BESYLATE 5 MG PO TABS: 5.0000 mg | ORAL_TABLET | Freq: Every day | ORAL | 0 refills | Status: DC

## 2017-06-02 NOTE — Telephone Encounter (Signed)
Pt advised of results. 

## 2017-06-02 NOTE — Telephone Encounter (Signed)
Pt called back and is confused about which blood pressure medications she is supposed to be taking and which one she needs to discontinue if any.  Pt's call [phone]  She will not be available after 4 pm  Thanks teri

## 2017-06-02 NOTE — Telephone Encounter (Signed)
Pt advised. Med sent in. appt made.

## 2017-06-02 NOTE — Telephone Encounter (Signed)
Patient was advised of labs results again. Expressed understanding.

## 2017-06-07 ENCOUNTER — Encounter: Payer: Self-pay | Admitting: Family Medicine

## 2017-06-12 ENCOUNTER — Other Ambulatory Visit: Payer: Self-pay | Admitting: Family Medicine

## 2017-06-12 ENCOUNTER — Encounter: Payer: Self-pay | Admitting: Family Medicine

## 2017-06-12 DIAGNOSIS — F419 Anxiety disorder, unspecified: Secondary | ICD-10-CM

## 2017-06-14 ENCOUNTER — Other Ambulatory Visit: Payer: Self-pay | Admitting: Family Medicine

## 2017-06-14 DIAGNOSIS — Z1231 Encounter for screening mammogram for malignant neoplasm of breast: Secondary | ICD-10-CM

## 2017-06-29 NOTE — Progress Notes (Signed)
Patient: Joan Baldwin Female    DOB: 04/04/1945   72 y.o.   MRN: [id number] Visit Date: 06/30/2017  Today's Provider: Lelon Huh, MD   Chief Complaint  Patient presents with  . Hypertension    Four week follow up   Subjective:    HPI  Hypertension, follow-up:  BP Readings from Last 3 Encounters:  05/31/17 134/70  05/31/17 (!) 164/66  11/29/16 (!) 142/62    She was last seen for hypertension 1 months ago.  BP at that visit was 134/70. Management since that visit includes; labs checked. Sodium levels low at 130.Marland Kitchen Changed HCTZ to amlodipine 5 mg qd .She reports excellent compliance with treatment. She is not having side effects.  She exercising some. She is adherent to low salt diet.   Outside blood pressures are 120-130's/? (Pt reports she does not look at the bottom number). She is experiencing none.  Patient denies chest pain, exertional chest pressure/discomfort, fatigue, lower extremity edema and palpitations.   Cardiovascular risk factors include advanced age (older than 19 for men, 39 for women) and hypertension.  Use of agents associated with hypertension: none.   ------------------------------------------------------------------------    Allergies  Allergen Reactions  . Codeine Itching  . Tramadol Hcl Other (See Comments)    Insomnia  . Ultram  [Tramadol Hcl]     Insomnia     Current Outpatient Medications:  .  acetaminophen (TYLENOL) 500 MG tablet, Take 500 mg by mouth every 8 (eight) hours as needed. , Disp: , Rfl:  .  alendronate (FOSAMAX) 70 MG tablet, Take 1 tablet (70 mg total) by mouth every 7 (seven) days. Take with a full glass of water on an empty stomach., Disp: 12 tablet, Rfl: 4 .  amLODipine (NORVASC) 5 MG tablet, Take 1 tablet (5 mg total) by mouth daily., Disp: 30 tablet, Rfl: 0 .  aspirin 81 MG tablet, Take 1 tablet by mouth daily., Disp: , Rfl:  .  Calcium Carbonate-Vitamin D (CALCIUM 600/VITAMIN D PO), Take 1 tablet by  mouth daily. , Disp: , Rfl:  .  diazepam (VALIUM) 5 MG tablet, TAKE 1 TABLET BY MOUTH EVERY 6 HOURS AS NEEDED FOR ANXIETY, Disp: 120 tablet, Rfl: 5 .  Fiber POWD, Take 2 scoop by mouth daily., Disp: , Rfl:  .  ibuprofen (ADVIL,MOTRIN) 200 MG tablet, Take 200 mg by mouth every 6 (six) hours as needed., Disp: , Rfl:  .  latanoprost (XALATAN) 0.005 % ophthalmic solution, Place 1 drop into both eyes at bedtime., Disp: , Rfl:  .  lisinopril (PRINIVIL,ZESTRIL) 10 MG tablet, Take 1 tablet (10 mg total) by mouth daily., Disp: 90 tablet, Rfl: 4 .  Multiple Vitamins-Minerals (MULTIVITAMIN ADULT PO), Take 1 tablet by mouth daily., Disp: , Rfl:  .  Omega-3 Fatty Acids (FISH OIL) 1000 MG CAPS, Take 1 capsule by mouth 2 (two) times daily., Disp: , Rfl:  .  omeprazole (PRILOSEC) 20 MG capsule, TAKE ONE CAPSULE BY MOUTH ONCE DAILY, Disp: 30 capsule, Rfl: 12 .  Timolol Maleate 0.5 % (DAILY) SOLN, Place 1 drop into both eyes at bedtime., Disp: , Rfl:  .  valACYclovir (VALTREX) 1000 MG tablet, 2 tablets twice a day for 1 day as needed for cold sores, Disp: 16 tablet, Rfl: 1  Review of Systems  Constitutional: Negative.  Negative for appetite change, chills, fatigue and fever.  Respiratory: Negative.  Negative for chest tightness and shortness of breath.   Cardiovascular: Negative.  Negative for  chest pain and palpitations.  Gastrointestinal: Positive for constipation. Negative for abdominal distention, abdominal pain, anal bleeding, blood in stool, diarrhea, nausea, rectal pain and vomiting.  Neurological: Negative for dizziness, weakness, light-headedness and headaches.    Social History   Tobacco Use  . Smoking status: Former Smoker    Packs/day: 1.00    Years: 30.00    Pack years: 30.00    Types: Cigarettes    Last attempt to quit: 01/11/2001    Years since quitting: 16.4  . Smokeless tobacco: Never Used  Substance Use Topics  . Alcohol use: No    Alcohol/week: 0.0 oz   Objective:   BP 124/66 (BP  Location: Left Arm, Patient Position: Sitting, Cuff Size: Normal)   Pulse 68   Temp 98.4 F (36.9 C) (Oral)   Resp 16   Wt 134 lb (60.8 kg)   BMI 21.63 kg/m    Physical Exam  General appearance: alert, well developed, well nourished, cooperative and in no distress Head: Normocephalic, without obvious abnormality, atraumatic Respiratory: Respirations even and unlabored, normal respiratory rate Extremities: No gross deformities Skin: Skin color, texture, turgor normal. No rashes seen  Psych: Appropriate mood and affect. Neurologic: Mental status: Alert, oriented to person, place, and time, thought content appropriate.     Assessment & Plan:     1. Essential (primary) hypertension Well controlled with change from hctz to amlodipine. Continue current medications.   - amLODipine (NORVASC) 5 MG tablet; Take 1 tablet (5 mg total) by mouth daily.  Dispense: 90 tablet; Refill: 5 - Renal function panel  2. Hyponatremia Likely due to hctz which has been discontinued. Recheck renal panel today.        Lelon Huh, MD  [facility]

## 2017-06-30 ENCOUNTER — Encounter: Payer: Self-pay | Admitting: Family Medicine

## 2017-06-30 ENCOUNTER — Ambulatory Visit (INDEPENDENT_AMBULATORY_CARE_PROVIDER_SITE_OTHER): Payer: Medicare Other | Admitting: Family Medicine

## 2017-06-30 VITALS — BP 124/66 | HR 68 | Temp 98.4°F | Resp 16 | Wt 134.0 lb

## 2017-06-30 DIAGNOSIS — I10 Essential (primary) hypertension: Secondary | ICD-10-CM

## 2017-06-30 DIAGNOSIS — E871 Hypo-osmolality and hyponatremia: Secondary | ICD-10-CM | POA: Diagnosis not present

## 2017-06-30 MED ORDER — AMLODIPINE BESYLATE 5 MG PO TABS: 5.0000 mg | ORAL_TABLET | Freq: Every day | ORAL | 5 refills | Status: DC

## 2017-07-01 LAB — RENAL FUNCTION PANEL
ALBUMIN: 3.9 g/dL (ref 3.5–4.8)
BUN/Creatinine Ratio: 15 (ref 12–28)
BUN: 11 mg/dL (ref 8–27)
CALCIUM: 9.4 mg/dL (ref 8.7–10.3)
CO2: 25 mmol/L (ref 20–29)
CREATININE: 0.71 mg/dL (ref 0.57–1.00)
Chloride: 101 mmol/L (ref 96–106)
GFR calc Af Amer: 97 mL/min/{1.73_m2} (ref >59)
GFR, EST NON AFRICAN AMERICAN: 84 mL/min/{1.73_m2} (ref >59)
Glucose: 137 mg/dL — ABNORMAL HIGH (ref 65–99)
PHOSPHORUS: 3.5 mg/dL (ref 2.5–4.5)
Potassium: 5.5 mmol/L — ABNORMAL HIGH (ref 3.5–5.2)
SODIUM: 139 mmol/L (ref 134–144)

## 2017-07-02 ENCOUNTER — Other Ambulatory Visit: Payer: Self-pay | Admitting: Family Medicine

## 2017-07-11 ENCOUNTER — Telehealth: Payer: Self-pay | Admitting: Family Medicine

## 2017-07-11 ENCOUNTER — Encounter: Payer: Self-pay | Admitting: Family Medicine

## 2017-07-11 NOTE — Telephone Encounter (Signed)
Dr. Caryn Section had a cancellation in his schedule for tomorrow at 10 am. Pt is scheduled for 10 am tomorrow. Thanks TNP

## 2017-07-11 NOTE — Telephone Encounter (Signed)
Please advise 

## 2017-07-11 NOTE — Telephone Encounter (Signed)
Pt called stating that she needs clearance form completed and a CT scan ordered so she can have a procedure done on her sinus. Pt stated that she is having to travel to [place] to get this done and plans to have procedure done on 07/25/17. Pt is schedule for Dr. Maralyn Sago next open appt on 07/20/17. Pt is asking if Dr. Caryn Section can work her in his schedule sooner and also order the head CT scan. Please advise. Thanks TNP

## 2017-07-11 NOTE — Progress Notes (Signed)
Patient: Joan Baldwin Female    DOB: Feb 05, 1945   72 y.o.   MRN: [id number] Visit Date: 07/12/2017  Today's Provider: Lelon Huh, MD   Chief Complaint  Patient presents with  . Pre-op Exam   Subjective:    HPI  Patient is here for surgical clearance for denta procedure she is anticipating being done 07-25-2017. She states she received an unsolicited e-mail from clinic in [place] for dental procedure for chronic dental pain 2 days ago. She has not discussed procedure with her local dentist of oral surgeon. She states she is in a rush to have procesure done because she gets a $5,000 discount if done by 07-25-2017.    Allergies  Allergen Reactions  . Codeine Itching  . Tramadol Hcl Other (See Comments)    Insomnia  . Ultram  [Tramadol Hcl]     Insomnia     Current Outpatient Medications:  .  acetaminophen (TYLENOL) 500 MG tablet, Take 500 mg by mouth every 8 (eight) hours as needed. , Disp: , Rfl:  .  alendronate (FOSAMAX) 70 MG tablet, Take 1 tablet (70 mg total) by mouth every 7 (seven) days. Take with a full glass of water on an empty stomach., Disp: 12 tablet, Rfl: 4 .  amLODipine (NORVASC) 5 MG tablet, Take 1 tablet (5 mg total) by mouth daily., Disp: 90 tablet, Rfl: 5 .  aspirin 81 MG tablet, Take 1 tablet by mouth daily., Disp: , Rfl:  .  Calcium Carbonate-Vitamin D (CALCIUM 600/VITAMIN D PO), Take 1 tablet by mouth daily. , Disp: , Rfl:  .  diazepam (VALIUM) 5 MG tablet, TAKE 1 TABLET BY MOUTH EVERY 6 HOURS AS NEEDED FOR ANXIETY, Disp: 120 tablet, Rfl: 5 .  Fiber POWD, Take 2 scoop by mouth daily., Disp: , Rfl:  .  ibuprofen (ADVIL,MOTRIN) 200 MG tablet, Take 200 mg by mouth every 6 (six) hours as needed., Disp: , Rfl:  .  latanoprost (XALATAN) 0.005 % ophthalmic solution, Place 1 drop into both eyes at bedtime., Disp: , Rfl:  .  lisinopril (PRINIVIL,ZESTRIL) 10 MG tablet, TAKE 1 TABLET BY MOUTH ONCE DAILY, Disp: 90 tablet, Rfl: 4 .  Multiple Vitamins-Minerals  (MULTIVITAMIN ADULT PO), Take 1 tablet by mouth daily., Disp: , Rfl:  .  Omega-3 Fatty Acids (FISH OIL) 1000 MG CAPS, Take 1 capsule by mouth 2 (two) times daily., Disp: , Rfl:  .  omeprazole (PRILOSEC) 20 MG capsule, TAKE ONE CAPSULE BY MOUTH ONCE DAILY, Disp: 30 capsule, Rfl: 12 .  Timolol Maleate 0.5 % (DAILY) SOLN, Place 1 drop into both eyes at bedtime., Disp: , Rfl:  .  valACYclovir (VALTREX) 1000 MG tablet, 2 tablets twice a day for 1 day as needed for cold sores, Disp: 16 tablet, Rfl: 1  Review of Systems  Constitutional: Negative for appetite change, chills, fatigue and fever.  Respiratory: Negative for chest tightness and shortness of breath.   Cardiovascular: Negative for chest pain and palpitations.  Gastrointestinal: Negative for abdominal pain, nausea and vomiting.  Neurological: Negative for dizziness and weakness.    Social History   Tobacco Use  . Smoking status: Former Smoker    Packs/day: 1.00    Years: 30.00    Pack years: 30.00    Types: Cigarettes    Last attempt to quit: 01/11/2001    Years since quitting: 16.5  . Smokeless tobacco: Never Used  Substance Use Topics  . Alcohol use: No    Alcohol/week:  0.0 oz   Objective:   BP 138/64 (BP Location: Right Arm, Patient Position: Sitting, Cuff Size: Normal)   Pulse 68   Resp 16   Ht 5\' 6"  (1.676 m)   Wt 133 lb (60.3 kg)   SpO2 99%   BMI 21.47 kg/m  Vitals:   07/12/17 1011  BP: 138/64  Pulse: 68  Resp: 16  SpO2: 99%  Weight: 133 lb (60.3 kg)  Height: 5\' 6"  (1.676 m)     Physical Exam   General Appearance:    Alert, cooperative, no distress  Eyes:    PERRL, conjunctiva/corneas clear, EOM's intact       Lungs:     Clear to auscultation bilaterally, respirations unlabored  Heart:    Regular rate and rhythm  Neurologic:   Awake, alert, oriented x 3. No apparent focal neurological           defect.           Assessment & Plan:     After further consideration, patient has decided not proceed with  procedure.       Lelon Huh, MD  [facility]

## 2017-07-12 ENCOUNTER — Telehealth: Payer: Self-pay | Admitting: Family Medicine

## 2017-07-12 ENCOUNTER — Encounter: Payer: Self-pay | Admitting: Family Medicine

## 2017-07-12 ENCOUNTER — Ambulatory Visit (INDEPENDENT_AMBULATORY_CARE_PROVIDER_SITE_OTHER): Payer: Medicare Other | Admitting: Family Medicine

## 2017-07-12 VITALS — BP 138/64 | HR 68 | Resp 16 | Ht 66.0 in | Wt 133.0 lb

## 2017-07-12 DIAGNOSIS — G8929 Other chronic pain: Secondary | ICD-10-CM

## 2017-07-12 DIAGNOSIS — K089 Disorder of teeth and supporting structures, unspecified: Secondary | ICD-10-CM

## 2017-07-12 NOTE — Telephone Encounter (Signed)
As in this morning for a medical clearance.  The dental office needs [place] to order a CT scan of her head where they can see her sinus and teeth  Their call back is [phone]  Thanks teri

## 2017-07-13 NOTE — Telephone Encounter (Signed)
Pt stated after discussing the procedure with Dr. Caryn Section at her [place] 07/12/17. Pt has decided not to move forward with the procedure that she was going to have done in [place]. Pt stated that she no longer needs a CT scan to be order and wanted to thank Dr. Caryn Section. Please advise. Thanks TNP

## 2017-07-20 ENCOUNTER — Ambulatory Visit: Payer: Medicare Other | Admitting: Family Medicine

## 2017-08-01 DIAGNOSIS — L821 Other seborrheic keratosis: Secondary | ICD-10-CM | POA: Diagnosis not present

## 2017-08-01 DIAGNOSIS — L820 Inflamed seborrheic keratosis: Secondary | ICD-10-CM | POA: Diagnosis not present

## 2017-08-01 DIAGNOSIS — D1801 Hemangioma of skin and subcutaneous tissue: Secondary | ICD-10-CM | POA: Diagnosis not present

## 2017-08-04 ENCOUNTER — Telehealth: Payer: Self-pay

## 2017-08-04 NOTE — Telephone Encounter (Signed)
Please advise 

## 2017-08-04 NOTE — Telephone Encounter (Signed)
Brea with Dr. Tula Nakayama is requesting patient's last OV notes and last labs be faxed to [phone]. Patient has a OV scheduled with them on 08/11/17.

## 2017-08-04 NOTE — Telephone Encounter (Signed)
I have no idea who this is or what it is about. Need more information, patient needs to sign consent to release medical information. This message needs to go to medical records.

## 2017-08-05 NOTE — Telephone Encounter (Signed)
Last OV notes and labs were faxed to Dr. Tula Nakayama @ [phone]. Thanks CC

## 2017-08-15 ENCOUNTER — Ambulatory Visit
Admission: RE | Admit: 2017-08-15 | Discharge: 2017-08-15 | Disposition: A | Discharge status: Home / Self Care | Payer: Medicare Other | Source: Ambulatory Visit | Attending: Family Medicine | Admitting: Family Medicine

## 2017-08-15 DIAGNOSIS — Z1231 Encounter for screening mammogram for malignant neoplasm of breast: Secondary | ICD-10-CM | POA: Insufficient documentation

## 2017-08-16 ENCOUNTER — Telehealth: Payer: Self-pay | Admitting: Family Medicine

## 2017-08-16 NOTE — Telephone Encounter (Signed)
Pt called wanting to know if we have gotten her mammogram results back yet  CB# [phone]  Thanks Con [employer]

## 2017-08-17 NOTE — Telephone Encounter (Signed)
Pease advise results/

## 2017-08-17 NOTE — Telephone Encounter (Signed)
It was normal. A letter with result has already been sent to her.

## 2017-08-18 NOTE — Telephone Encounter (Signed)
Patient was notified of results. Expressed understanding.  

## 2017-08-23 ENCOUNTER — Encounter: Payer: Self-pay | Admitting: Family Medicine

## 2017-09-02 DIAGNOSIS — G501 Atypical facial pain: Secondary | ICD-10-CM | POA: Diagnosis not present

## 2017-09-02 DIAGNOSIS — K089 Disorder of teeth and supporting structures, unspecified: Secondary | ICD-10-CM | POA: Diagnosis not present

## 2017-09-03 ENCOUNTER — Encounter: Payer: Self-pay | Admitting: Family Medicine

## 2017-09-12 ENCOUNTER — Other Ambulatory Visit: Payer: Self-pay | Admitting: Family Medicine

## 2017-09-12 DIAGNOSIS — M810 Age-related osteoporosis without current pathological fracture: Secondary | ICD-10-CM

## 2017-09-14 DIAGNOSIS — G501 Atypical facial pain: Secondary | ICD-10-CM | POA: Diagnosis not present

## 2017-09-14 DIAGNOSIS — G508 Other disorders of trigeminal nerve: Secondary | ICD-10-CM | POA: Diagnosis not present

## 2017-09-14 DIAGNOSIS — K089 Disorder of teeth and supporting structures, unspecified: Secondary | ICD-10-CM | POA: Diagnosis not present

## 2017-09-17 ENCOUNTER — Encounter: Payer: Self-pay | Admitting: Family Medicine

## 2017-10-03 ENCOUNTER — Encounter: Payer: Self-pay | Admitting: Family Medicine

## 2017-10-03 ENCOUNTER — Ambulatory Visit (INDEPENDENT_AMBULATORY_CARE_PROVIDER_SITE_OTHER): Payer: Medicare Other | Admitting: Family Medicine

## 2017-10-03 DIAGNOSIS — Z23 Encounter for immunization: Secondary | ICD-10-CM

## 2017-10-03 DIAGNOSIS — I10 Essential (primary) hypertension: Secondary | ICD-10-CM | POA: Diagnosis not present

## 2017-10-03 DIAGNOSIS — F419 Anxiety disorder, unspecified: Secondary | ICD-10-CM | POA: Diagnosis not present

## 2017-10-03 MED ORDER — AMLODIPINE BESYLATE 10 MG PO TABS: 10.0000 mg | ORAL_TABLET | Freq: Every day | ORAL | 1 refills | Status: DC

## 2017-10-03 MED ORDER — DIAZEPAM 10 MG PO TABS: 5.0000 mg | ORAL_TABLET | Freq: Four times a day (QID) | ORAL | 0 refills | Status: DC | PRN

## 2017-10-03 MED ORDER — LISINOPRIL 20 MG PO TABS: 20.0000 mg | ORAL_TABLET | Freq: Every day | ORAL | 1 refills | Status: DC

## 2017-10-03 NOTE — Progress Notes (Signed)
Patient: Joan Baldwin Female    DOB: 05/15/1945   72 y.o.   MRN: [id number] Visit Date: 10/03/2017  Today's Provider: Lelon Huh, MD   Chief Complaint  Patient presents with  . Hypertension   Subjective:    HPI   Hypertension, follow-up:  BP Readings from Last 3 Encounters:  10/03/17 (!) 152/82  07/12/17 138/64  06/30/17 124/66    She was last seen for hypertension 3 months ago.  BP at that visit was 124/66. Management since that visit includes; no changes.She reports good compliance with treatment. Patient states she has been taking more of her blood pressure medication, and some of her husbands Losartan, since her blood pressure has been elevated. She has also been taking more doses of Valium to help calm her.  She is not having side effects.  She is not exercising. She is adherent to low salt diet.   Outside blood pressures are 155/70. She is experiencing dyspnea.  Patient denies chest pain, chest pressure/discomfort, claudication, exertional chest pressure/discomfort, irregular heart beat, lower extremity edema, near-syncope, orthopnea, palpitations, paroxysmal nocturnal dyspnea, syncope and tachypnea.   Cardiovascular risk factors include advanced age (older than 49 for men, 83 for women) and hypertension.  Use of agents associated with hypertension: NSAIDS.   ------------------------------------------------------------------------  She also reports that she had more and more trouble with anxiety. Valium has also worked well in the past, but no longer helping. She take one tablet three times during the day in addition to 2 at bedtime to help her sleep.  She wats to try higher dose. Denies any adverse effects, including lethargy or memory problems.   Allergies  Allergen Reactions  . Codeine Itching  . Tramadol Hcl Other (See Comments)    Insomnia  . Ultram  [Tramadol Hcl]     Insomnia     Current Outpatient Medications:  .  acetaminophen (TYLENOL)  500 MG tablet, Take 500 mg by mouth every 8 (eight) hours as needed. , Disp: , Rfl:  .  alendronate (FOSAMAX) 70 MG tablet, TAKE 1 TABLET BY MOUTH EVERY 7 DAYS. TAKE WITH A FULL GLASS OF WATER ON EMPTY STOMACH, Disp: 12 tablet, Rfl: 4 .  amLODipine (NORVASC) 5 MG tablet, Take 1 tablet (5 mg total) by mouth daily., Disp: 90 tablet, Rfl: 5 .  aspirin 81 MG tablet, Take 1 tablet by mouth daily., Disp: , Rfl:  .  Calcium Carbonate-Vitamin D (CALCIUM 600/VITAMIN D PO), Take 1 tablet by mouth daily. , Disp: , Rfl:  .  diazepam (VALIUM) 5 MG tablet, TAKE 1 TABLET BY MOUTH EVERY 6 HOURS AS NEEDED FOR ANXIETY, Disp: 120 tablet, Rfl: 5 .  Fiber POWD, Take 2 scoop by mouth daily., Disp: , Rfl:  .  hydrochlorothiazide (HYDRODIURIL) 25 MG tablet, Take 25 mg by mouth daily., Disp: , Rfl: 4 .  latanoprost (XALATAN) 0.005 % ophthalmic solution, Place 1 drop into both eyes at bedtime., Disp: , Rfl:  .  lisinopril (PRINIVIL,ZESTRIL) 10 MG tablet, TAKE 1 TABLET BY MOUTH ONCE DAILY, Disp: 90 tablet, Rfl: 4 .  Multiple Vitamins-Minerals (MULTIVITAMIN ADULT PO), Take 1 tablet by mouth daily., Disp: , Rfl:  .  Omega-3 Fatty Acids (FISH OIL) 1000 MG CAPS, Take 1 capsule by mouth 2 (two) times daily., Disp: , Rfl:  .  omeprazole (PRILOSEC) 20 MG capsule, TAKE ONE CAPSULE BY MOUTH ONCE DAILY, Disp: 30 capsule, Rfl: 12 .  Timolol Maleate 0.5 % (DAILY) SOLN, Place 1 drop  into both eyes at bedtime., Disp: , Rfl:  .  valACYclovir (VALTREX) 1000 MG tablet, 2 tablets twice a day for 1 day as needed for cold sores, Disp: 16 tablet, Rfl: 1  Review of Systems  Constitutional: Negative for appetite change, chills, fatigue and fever.  Respiratory: Positive for shortness of breath. Negative for chest tightness.   Cardiovascular: Negative for chest pain and palpitations.  Gastrointestinal: Negative for abdominal pain, nausea and vomiting.  Endocrine: Positive for cold intolerance.  Neurological: Negative for dizziness and weakness.      Social History   Tobacco Use  . Smoking status: Former Smoker    Packs/day: 1.00    Years: 30.00    Pack years: 30.00    Types: Cigarettes    Last attempt to quit: 01/11/2001    Years since quitting: 16.7  . Smokeless tobacco: Never Used  Substance Use Topics  . Alcohol use: No    Alcohol/week: 0.0 standard drinks   Objective:   BP (!) 152/82 (BP Location: Left Arm, Patient Position: Sitting, Cuff Size: Normal)   Pulse 87   Temp 98.9 F (37.2 C) (Oral)   Resp 16   Wt 131 lb (59.4 kg)   SpO2 99% Comment: room air  BMI 21.14 kg/m  Vitals:   10/03/17 1455  BP: (!) 152/82  Pulse: 87  Resp: 16  Temp: 98.9 F (37.2 C)  TempSrc: Oral  SpO2: 99%  Weight: 131 lb (59.4 kg)     Physical Exam   General Appearance:    Alert, cooperative, no distress  Eyes:    PERRL, conjunctiva/corneas clear, EOM's intact       Lungs:     Clear to auscultation bilaterally, respirations unlabored  Heart:    Regular rate and rhythm  Neurologic:   Awake, alert, oriented x 3. No apparent focal neurological           defect.           Assessment & Plan:     1. Essential (primary) hypertension Labile BP, likely exacerbated by anxiety. Counseled NOT to take other peoples blood pressure medications. Will increase lisinopril and amlodipine, and continue current dose of hctz.  - hydrochlorothiazide (HYDRODIURIL) 25 MG tablet; Take 25 mg by mouth daily.; Refill: 4 - lisinopril (PRINIVIL,ZESTRIL) 20 MG tablet; Take 1 tablet (20 mg total) by mouth daily.  Dispense: 30 tablet; Refill: 1 - amLODipine (NORVASC) 10 MG tablet; Take 1 tablet (10 mg total) by mouth daily.  Dispense: 30 tablet; Refill: 1  2. Anxiety She has been taking 5mg  three during the day and 2 at night, but is still extremely anxious. Will increase to 10mg  and advised not take more than 1 tablet at a time, and no more than 4 throughout the course of the day.  - diazepam (VALIUM) 10 MG tablet; Take 0.5-1 tablets (5-10 mg total) by  mouth 4 (four) times daily as needed for anxiety.  Dispense: 120 tablet; Refill: 0  Consider SSRI if continues to have trouble.   Flu shot given today         Lelon Huh, MD  [facility]

## 2017-10-05 ENCOUNTER — Encounter: Payer: Self-pay | Admitting: Family Medicine

## 2017-10-26 DIAGNOSIS — H401131 Primary open-angle glaucoma, bilateral, mild stage: Secondary | ICD-10-CM | POA: Diagnosis not present

## 2017-11-02 ENCOUNTER — Ambulatory Visit (INDEPENDENT_AMBULATORY_CARE_PROVIDER_SITE_OTHER): Payer: Medicare Other | Admitting: Family Medicine

## 2017-11-02 ENCOUNTER — Encounter: Payer: Self-pay | Admitting: Family Medicine

## 2017-11-02 VITALS — BP 144/78 | HR 61 | Temp 99.0°F | Wt 131.2 lb

## 2017-11-02 DIAGNOSIS — G8929 Other chronic pain: Secondary | ICD-10-CM

## 2017-11-02 DIAGNOSIS — F419 Anxiety disorder, unspecified: Secondary | ICD-10-CM | POA: Diagnosis not present

## 2017-11-02 DIAGNOSIS — K089 Disorder of teeth and supporting structures, unspecified: Secondary | ICD-10-CM | POA: Diagnosis not present

## 2017-11-02 DIAGNOSIS — I10 Essential (primary) hypertension: Secondary | ICD-10-CM

## 2017-11-02 MED ORDER — DIAZEPAM 10 MG PO TABS: 5.0000 mg | ORAL_TABLET | Freq: Four times a day (QID) | ORAL | 5 refills | Status: DC | PRN

## 2017-11-02 MED ORDER — LIDOCAINE VISCOUS HCL 2 % MT SOLN: 15.0000 mL | Freq: Four times a day (QID) | OROMUCOSAL | 4 refills | Status: DC | PRN

## 2017-11-02 NOTE — Progress Notes (Signed)
Patient: Joan Baldwin Female    DOB: 06/01/1945   72 y.o.   MRN: [id number] Visit Date: 11/02/2017  Today's Provider: Lelon Huh, MD   Chief Complaint  Patient presents with  . Hypertension  . Anxiety   Subjective:    HPI  Hypertension, follow-up:  BP Readings from Last 3 Encounters:  11/02/17 (!) 153/72  10/03/17 (!) 152/82  07/12/17 138/64    She was last seen for hypertension 1 months ago.  BP at that visit was 153/72. Management changes since that visit include increased the Lisinopril and Amlodipine. She states BP initially came down to the 110s and 120s with dose change, but has since been going back up.  She reports good compliance with treatment. She is not having side effects.  She is exercising. She none adherent to low salt diet.   Outside blood pressures are 158-140/70-60. She is experiencing none.  Patient denies chest pain, chest pressure/discomfort, dyspnea, exertional chest pressure/discomfort, fatigue, irregular heart beat, palpitations and tachypnea.   Cardiovascular risk factors include hypertension.  Use of agents associated with hypertension: none.     Weight trend: stable Wt Readings from Last 3 Encounters:  11/02/17 131 lb 3.2 oz (59.5 kg)  10/03/17 131 lb (59.4 kg)  07/12/17 133 lb (60.3 kg)    Current diet: in general, a "healthy" diet    ------------------------------------------------------------------------ Anxiety  Patient presents today for anxiety. Patient states her anxiety has improved and medication is helping. Patient Valium was increased to 10 mg 1 tablet at a time and no more then 4 throughout the course of the day.  She also has chronic dental pain and was prescribed viscous lidocaine by specialist in [place], she thinks this is helping and requests refill. She is using 200-300 ml per month.     Allergies  Allergen Reactions  . Gabapentin Rash  . Codeine Itching  . Tramadol Hcl Other (See Comments)   Insomnia  . Ultram  [Tramadol Hcl]     Insomnia     Current Outpatient Medications:  .  acetaminophen (TYLENOL) 500 MG tablet, Take 500 mg by mouth every 8 (eight) hours as needed. , Disp: , Rfl:  .  alendronate (FOSAMAX) 70 MG tablet, TAKE 1 TABLET BY MOUTH EVERY 7 DAYS. TAKE WITH A FULL GLASS OF WATER ON EMPTY STOMACH, Disp: 12 tablet, Rfl: 4 .  amLODipine (NORVASC) 10 MG tablet, Take 1 tablet (10 mg total) by mouth daily., Disp: 30 tablet, Rfl: 1 .  aspirin 81 MG tablet, Take 1 tablet by mouth daily., Disp: , Rfl:  .  Calcium Carbonate-Vitamin D (CALCIUM 600/VITAMIN D PO), Take 1 tablet by mouth daily. , Disp: , Rfl:  .  diazepam (VALIUM) 10 MG tablet, Take 0.5-1 tablets (5-10 mg total) by mouth 4 (four) times daily as needed for anxiety., Disp: 120 tablet, Rfl: 0 .  Fiber POWD, Take 2 scoop by mouth daily., Disp: , Rfl:  .  hydrochlorothiazide (HYDRODIURIL) 25 MG tablet, Take 25 mg by mouth daily., Disp: , Rfl: 4 .  latanoprost (XALATAN) 0.005 % ophthalmic solution, Place 1 drop into both eyes at bedtime., Disp: , Rfl:  .  lisinopril (PRINIVIL,ZESTRIL) 20 MG tablet, Take 1 tablet (20 mg total) by mouth daily., Disp: 30 tablet, Rfl: 1 .  Multiple Vitamins-Minerals (MULTIVITAMIN ADULT PO), Take 1 tablet by mouth daily., Disp: , Rfl:  .  Omega-3 Fatty Acids (FISH OIL) 1000 MG CAPS, Take 1 capsule by mouth 2 (two)  times daily., Disp: , Rfl:  .  omeprazole (PRILOSEC) 20 MG capsule, TAKE ONE CAPSULE BY MOUTH ONCE DAILY, Disp: 30 capsule, Rfl: 12 .  Timolol Maleate 0.5 % (DAILY) SOLN, Place 1 drop into both eyes at bedtime., Disp: , Rfl:  .  valACYclovir (VALTREX) 1000 MG tablet, 2 tablets twice a day for 1 day as needed for cold sores, Disp: 16 tablet, Rfl: 1  Review of Systems  Constitutional: Negative.   HENT: Negative.   Respiratory: Negative.   Cardiovascular: Negative.   Gastrointestinal: Negative.   Genitourinary: Negative.   Musculoskeletal: Negative.   Psychiatric/Behavioral:  Negative.     Social History   Tobacco Use  . Smoking status: Former Smoker    Packs/day: 1.00    Years: 30.00    Pack years: 30.00    Types: Cigarettes    Last attempt to quit: 01/11/2001    Years since quitting: 16.8  . Smokeless tobacco: Never Used  Substance Use Topics  . Alcohol use: No    Alcohol/week: 0.0 standard drinks   Objective:    Vitals:   11/02/17 1052 11/02/17 1132  BP: (!) 153/72 (!) 144/78  Pulse: 61   Temp: 99 F (37.2 C)   TempSrc: Oral   SpO2: 96%   Weight: 131 lb 3.2 oz (59.5 kg)      Physical Exam  General appearance: alert, well developed, well nourished, cooperative and in no distress Head: Normocephalic, without obvious abnormality, atraumatic Respiratory: Respirations even and unlabored, normal respiratory rate Extremities: No gross deformities Skin: Skin color, texture, turgor normal. No rashes seen  Psych: Appropriate mood and affect. Neurologic: Mental status: Alert, oriented to person, place, and time, thought content appropriate.     Assessment & Plan:     1. Essential (primary) hypertension Labile BP, but tolerating increase dose of lisinopril and amlodipine. She has follow up in a month for pre-diabetes. Consider increasing medications further if not improved then.   2. Anxiety continue- diazepam (VALIUM) 10 MG tablet; Take 0.5-1 tablets (5-10 mg total) by mouth 4 (four) times daily as needed for anxiety.  Dispense: 120 tablet; Refill: 5  3. Chronic dental pain refill- lidocaine (XYLOCAINE) 2 % solution; Use as directed 15 mLs in the mouth or throat every 6 (six) hours as needed for mouth pain.  Dispense: 300 mL; Refill: 4       Lelon Huh, MD  [facility]

## 2017-11-07 ENCOUNTER — Other Ambulatory Visit: Payer: Self-pay | Admitting: Family Medicine

## 2017-11-07 MED ORDER — OMEPRAZOLE 20 MG PO CPDR: 20.0000 mg | DELAYED_RELEASE_CAPSULE | Freq: Every day | ORAL | 12 refills | Status: DC

## 2017-11-07 NOTE — Telephone Encounter (Signed)
Please advise 

## 2017-11-07 NOTE — Telephone Encounter (Signed)
Pt called needing refill on her  Omeprazole 20 mg  [facility]  Pt's ca,3  [phone]  Thanks  Con Mem[facility]

## 2017-11-29 ENCOUNTER — Other Ambulatory Visit: Payer: Self-pay | Admitting: Family Medicine

## 2017-11-29 DIAGNOSIS — I10 Essential (primary) hypertension: Secondary | ICD-10-CM

## 2017-12-02 ENCOUNTER — Encounter: Payer: Self-pay | Admitting: Family Medicine

## 2017-12-02 ENCOUNTER — Ambulatory Visit (INDEPENDENT_AMBULATORY_CARE_PROVIDER_SITE_OTHER): Payer: Medicare Other | Admitting: Family Medicine

## 2017-12-02 VITALS — BP 136/70 | HR 72 | Temp 98.3°F | Resp 16 | Wt 135.0 lb

## 2017-12-02 DIAGNOSIS — I10 Essential (primary) hypertension: Secondary | ICD-10-CM | POA: Diagnosis not present

## 2017-12-02 DIAGNOSIS — M810 Age-related osteoporosis without current pathological fracture: Secondary | ICD-10-CM

## 2017-12-02 DIAGNOSIS — R7303 Prediabetes: Secondary | ICD-10-CM

## 2017-12-02 DIAGNOSIS — E871 Hypo-osmolality and hyponatremia: Secondary | ICD-10-CM | POA: Diagnosis not present

## 2017-12-02 LAB — POCT GLYCOSYLATED HEMOGLOBIN (HGB A1C): HEMOGLOBIN A1C: 5.7 % — AB (ref 4.0–5.6)

## 2017-12-02 NOTE — Progress Notes (Signed)
Patient: Joan Baldwin Female    DOB: May 14, 1945   72 y.o.   MRN: [id number] Visit Date: 12/02/2017  Today's Provider: Lelon Huh, MD   Chief Complaint  Patient presents with  . Hypertension  . Hyperglycemia  . Anxiety   Subjective:    Hypertension  This is a chronic problem. The problem is unchanged. The problem is controlled (Pt states her blood pressures at home are usually 120's/?  She does not remember the bottom numbers. ). Associated symptoms include anxiety. Pertinent negatives include no chest pain, headaches, malaise/fatigue, palpitations, peripheral edema or shortness of breath. There are no associated agents to hypertension.  Anxiety  Presents for follow-up visit. Symptoms include excessive worry, nervous/anxious behavior and panic. Patient reports no chest pain, compulsions, confusion, decreased concentration, depressed mood, dizziness, dry mouth, feeling of choking, insomnia, irritability, malaise, muscle tension, nausea, obsessions, palpitations, restlessness, shortness of breath or suicidal ideas. The quality of sleep is good.      Hctz had been discontinued in May due to hyponatremia, however it was dispensed in September and she has been back on medication since then. It was around that time that amlodipine and lisinopril dosages were double to current dose due to elevated systolic BP of 242. She is tolerating her current regiment without adverse effects.   Follow up osteroporosis. She states she is taking alendronate every week consistently and tolerating well and was started after her last BMD in July 2018    Hyperglycemia, Follow-up:   Lab Results  Component Value Date   HGBA1C 5.7 (H) 05/31/2017   HGBA1C 5.9 11/29/2016   HGBA1C 6.1 05/26/2016   GLUCOSE 137 (H) 06/30/2017   GLUCOSE 106 (H) 05/31/2017   GLUCOSE 106 (H) 01/18/2016    Last seen for for this 6 months ago.  Management since then includes None. Current symptoms include none and  have been stable.  Weight trend: stable Prior visit with dietician: no Current diet: in general, a "healthy" diet  , diabetic Current exercise: some  Pertinent Labs:    Component Value Date/Time   CHOL 180 05/31/2017 1105   TRIG 89 05/31/2017 1105   CHOLHDL 3.6 05/31/2017 1105   CREATININE 0.71 06/30/2017 1015    Wt Readings from Last 3 Encounters:  12/02/17 135 lb (61.2 kg)  11/02/17 131 lb 3.2 oz (59.5 kg)  10/03/17 131 lb (59.4 kg)     BP Readings from Last 3 Encounters:  12/02/17 136/70  11/02/17 (!) 144/78  10/03/17 (!) 152/82       Allergies  Allergen Reactions  . Gabapentin Rash  . Codeine Itching  . Tramadol Hcl Other (See Comments)    Insomnia  . Ultram  [Tramadol Hcl]     Insomnia     Current Outpatient Medications:  .  acetaminophen (TYLENOL) 500 MG tablet, Take 500 mg by mouth every 8 (eight) hours as needed. , Disp: , Rfl:  .  alendronate (FOSAMAX) 70 MG tablet, TAKE 1 TABLET BY MOUTH EVERY 7 DAYS. TAKE WITH A FULL GLASS OF WATER ON EMPTY STOMACH, Disp: 12 tablet, Rfl: 4 .  amLODipine (NORVASC) 10 MG tablet, TAKE 1 TABLET BY MOUTH ONCE DAILY, Disp: 90 tablet, Rfl: 4 .  aspirin 81 MG tablet, Take 1 tablet by mouth daily., Disp: , Rfl:  .  Calcium Carbonate-Vitamin D (CALCIUM 600/VITAMIN D PO), Take 1 tablet by mouth daily. , Disp: , Rfl:  .  diazepam (VALIUM) 10 MG tablet, Take 0.5-1 tablets (5-10  mg total) by mouth 4 (four) times daily as needed for anxiety., Disp: 120 tablet, Rfl: 5 .  Fiber POWD, Take 2 scoop by mouth daily., Disp: , Rfl:  .  hydrochlorothiazide (HYDRODIURIL) 25 MG tablet, Take 25 mg by mouth daily., Disp: , Rfl: 4 .  latanoprost (XALATAN) 0.005 % ophthalmic solution, Place 1 drop into both eyes at bedtime., Disp: , Rfl:  .  lidocaine (XYLOCAINE) 2 % solution, Use as directed 15 mLs in the mouth or throat every 6 (six) hours as needed for mouth pain., Disp: 300 mL, Rfl: 4 .  lisinopril (PRINIVIL,ZESTRIL) 20 MG tablet, TAKE 1 TABLET  BY MOUTH ONCE DAILY, Disp: 90 tablet, Rfl: 4 .  Multiple Vitamins-Minerals (MULTIVITAMIN ADULT PO), Take 1 tablet by mouth daily., Disp: , Rfl:  .  Omega-3 Fatty Acids (FISH OIL) 1000 MG CAPS, Take 1 capsule by mouth 2 (two) times daily., Disp: , Rfl:  .  omeprazole (PRILOSEC) 20 MG capsule, Take 1 capsule (20 mg total) by mouth daily., Disp: 30 capsule, Rfl: 12 .  Timolol Maleate 0.5 % (DAILY) SOLN, Place 1 drop into both eyes at bedtime., Disp: , Rfl:  .  valACYclovir (VALTREX) 1000 MG tablet, 2 tablets twice a day for 1 day as needed for cold sores, Disp: 16 tablet, Rfl: 1  Review of Systems  Constitutional: Negative.  Negative for irritability and malaise/fatigue.  Respiratory: Negative.  Negative for shortness of breath.   Cardiovascular: Negative.  Negative for chest pain and palpitations.  Gastrointestinal: Positive for constipation. Negative for abdominal distention, abdominal pain, anal bleeding, blood in stool, diarrhea, nausea, rectal pain and vomiting.  Neurological: Negative for dizziness, light-headedness and headaches.  Psychiatric/Behavioral: Negative for confusion, decreased concentration and suicidal ideas. The patient is nervous/anxious. The patient does not have insomnia.     Social History   Tobacco Use  . Smoking status: Former Smoker    Packs/day: 1.00    Years: 30.00    Pack years: 30.00    Types: Cigarettes    Last attempt to quit: 01/11/2001    Years since quitting: 16.9  . Smokeless tobacco: Never Used  Substance Use Topics  . Alcohol use: No    Alcohol/week: 0.0 standard drinks       Office Visit from 12/02/2017 in [facility]  AUDIT-C Score  0        Objective:   BP 136/70 (BP Location: Left Arm, Patient Position: Sitting, Cuff Size: Normal)   Pulse 72   Temp 98.3 F (36.8 C) (Oral)   Resp 16   Wt 135 lb (61.2 kg)   BMI 21.79 kg/m  Vitals:   12/02/17 1052  BP: 136/70  Pulse: 72  Resp: 16  Temp: 98.3 F (36.8 C)    TempSrc: Oral  Weight: 135 lb (61.2 kg)     Physical Exam   General Appearance:    Alert, cooperative, no distress  Eyes:    PERRL, conjunctiva/corneas clear, EOM's intact       Lungs:     Clear to auscultation bilaterally, respirations unlabored  Heart:    Regular rate and rhythm  Neurologic:   Awake, alert, oriented x 3. No apparent focal neurological           defect.       Results for orders placed or performed in visit on 12/02/17  POCT HgB A1C  Result Value Ref Range   Hemoglobin A1C 5.7 (A) 4.0 - 5.6 %  Assessment & Plan:     1. Pre-diabetes Well controlled with dietary changes.  - POCT HgB A1C  2. Hyponatremia Had corrected after stopping hctz earlier this year, but is now back on hctz. Check electrolytes today,  - Renal function panel  3. Osteoporosis, unspecified osteoporosis type, unspecified pathological fracture presence Tolerating alendronate well and taking consistently since initiated in 2018. Anticipated BMD in 2020 - VITAMIN D 25 Hydroxy (Vit-D Deficiency, Fractures)  4. Essential (primary) hypertension Well controlled, but is back on hctz which previously contributed to hyponatremia. Would likely remain controlled if we need to stop hctz again, as we have since increased amlodipine and lisinpril        Lelon Huh, MD  [facility]

## 2017-12-03 LAB — VITAMIN D 25 HYDROXY (VIT D DEFICIENCY, FRACTURES): Vit D, 25-Hydroxy: 63 ng/mL (ref 30.0–100.0)

## 2017-12-03 LAB — RENAL FUNCTION PANEL
ALBUMIN: 4.1 g/dL (ref 3.5–4.8)
BUN / CREAT RATIO: 21 (ref 12–28)
BUN: 15 mg/dL (ref 8–27)
CO2: 26 mmol/L (ref 20–29)
Calcium: 9.4 mg/dL (ref 8.7–10.3)
Chloride: 93 mmol/L — ABNORMAL LOW (ref 96–106)
Creatinine, Ser: 0.72 mg/dL (ref 0.57–1.00)
GFR, EST AFRICAN AMERICAN: 95 mL/min/{1.73_m2} (ref >59)
GFR, EST NON AFRICAN AMERICAN: 83 mL/min/{1.73_m2} (ref >59)
GLUCOSE: 99 mg/dL (ref 65–99)
POTASSIUM: 5.2 mmol/L (ref 3.5–5.2)
Phosphorus: 4.5 mg/dL (ref 2.5–4.5)
SODIUM: 134 mmol/L (ref 134–144)

## 2018-01-22 IMAGING — DX DG CHEST 1V PORT
2 series · 2 of 2 positions shown · non-contrast
Comparison: None

CLINICAL DATA: Pt via ems from home on cpap. Pt O2 sat 72% upon EMS
arrival. Pt seen at PCP office yesterday for URI; given zithro and
hydrocet. No improvement today; EMS reports pt felt worse today,
with SOB on exertion, as well as lethargy. Pt tachycardic upon
arrival. Former smoker.

EXAM:
PORTABLE CHEST 1 VIEW

[chest ap (1 of 2)]
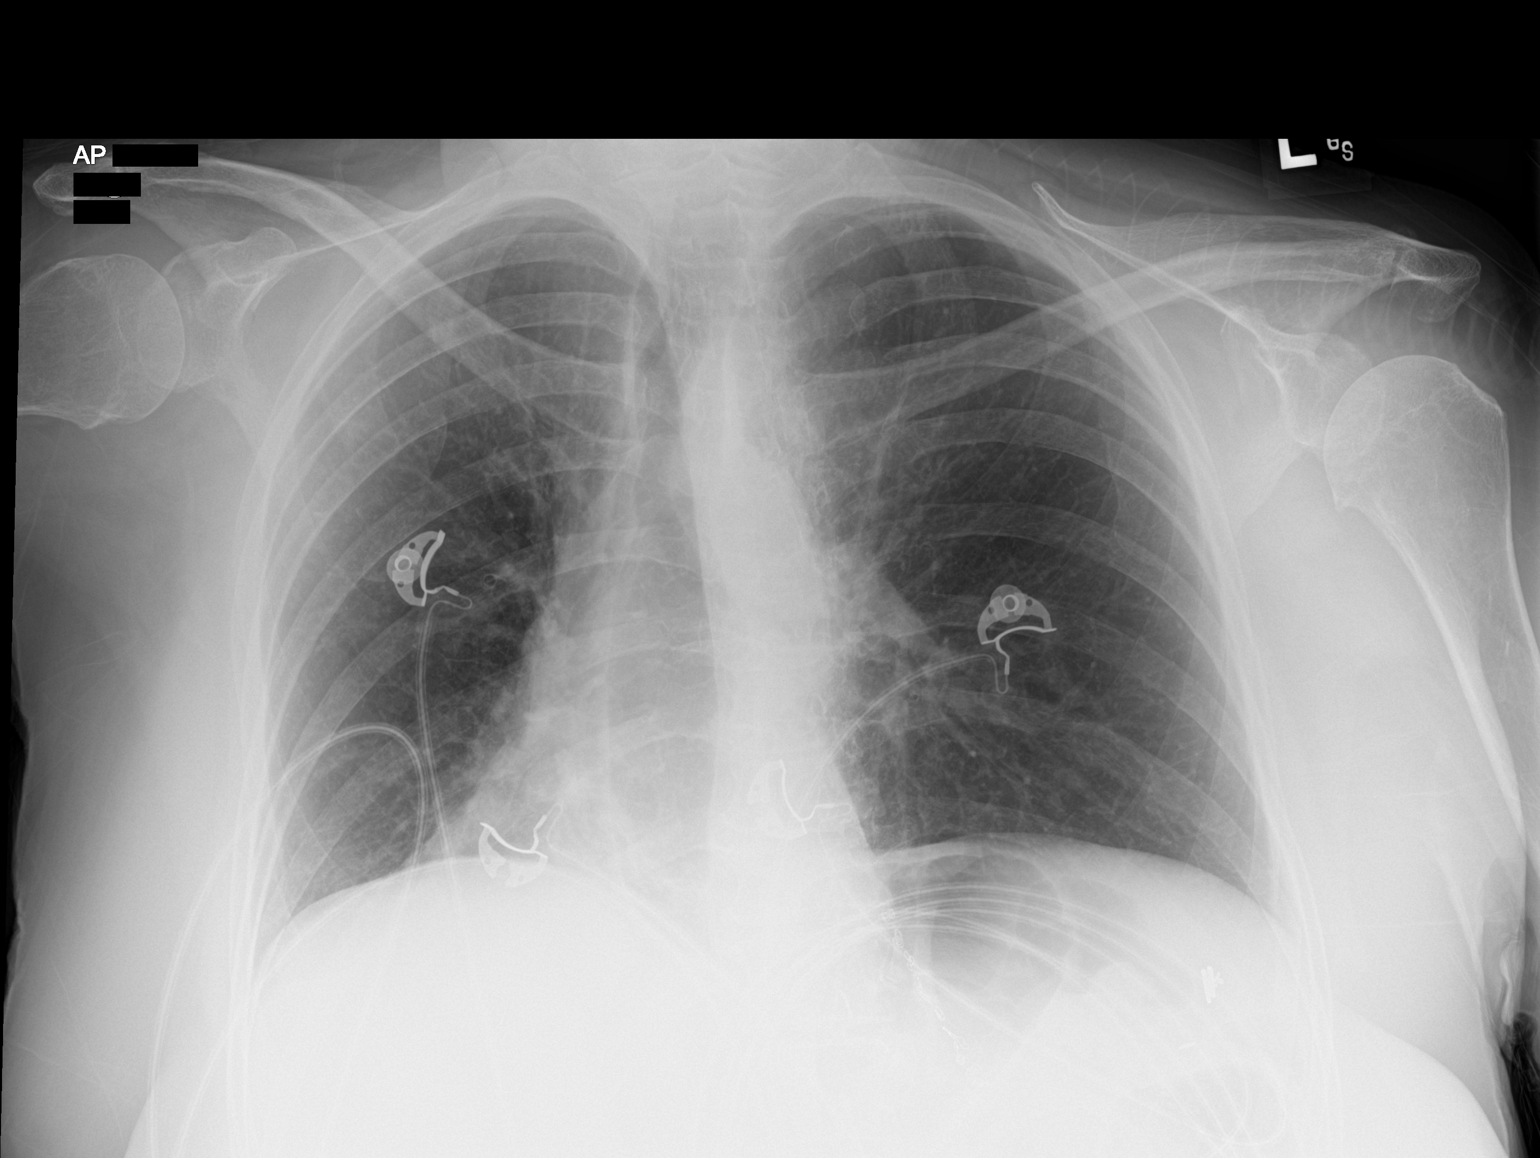

[chest ap (2 of 2)]
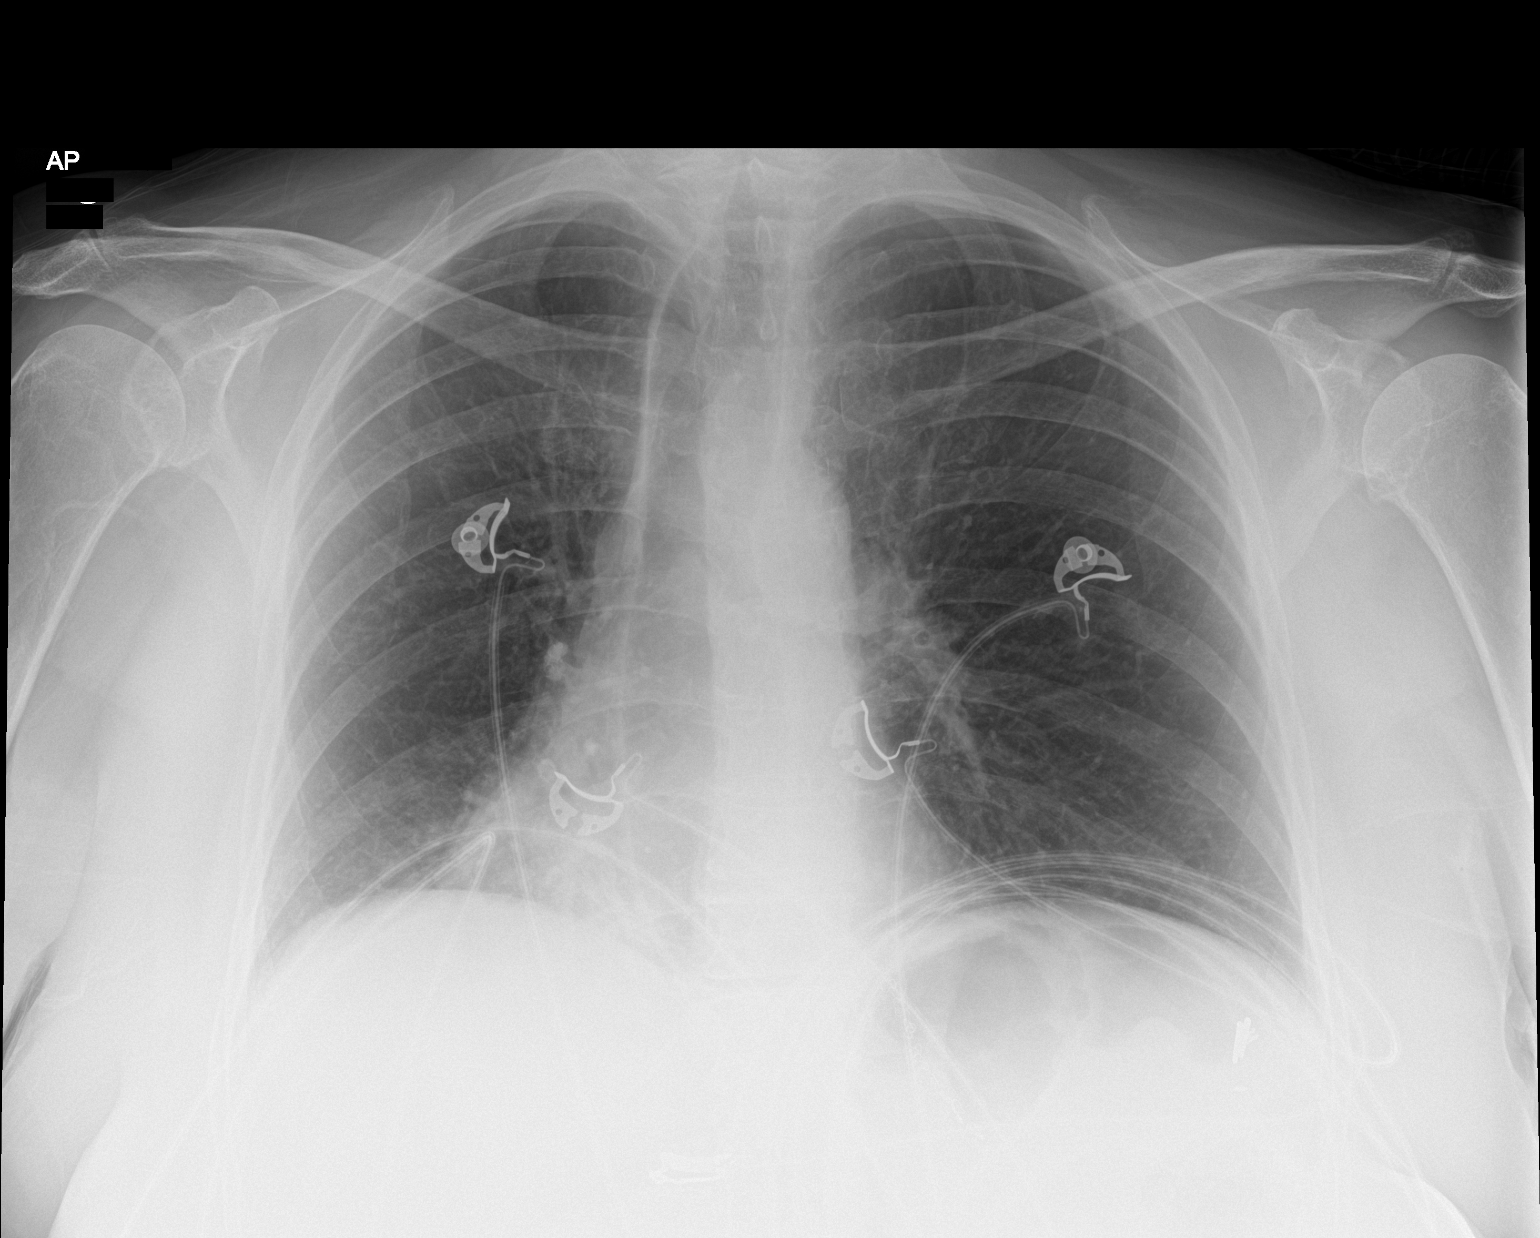

[2 of 2 positions shown; findings below may reference images not displayed]

FINDINGS: Cardiac silhouette is normal in size. No mediastinal or hilar
masses. No evidence of adenopathy.

Lungs are clear.  No convincing pleural effusion.  No pneumothorax.

Skeletal structures are demineralized but grossly intact.
IMPRESSION: No acute cardiopulmonary disease.

## 2018-01-22 IMAGING — CT CT ANGIO CHEST
2 of 6 series · 18 of 46 positions shown · IV contrast (APPLIED)
Comparison: Prior chest x-ray 01/17/2016

CLINICAL DATA: 70-year-old female with hypoxia and recent diagnosis
of upper respiratory infection

EXAM:
CT ANGIOGRAPHY CHEST WITH CONTRAST
TECHNIQUE: Multidetector CT imaging of the chest was performed using the
standard protocol during bolus administration of intravenous
contrast. Multiplanar CT image reconstructions and MIPs were
obtained to evaluate the vascular anatomy.
CONTRAST:  75 mL Isovue 370

[Series 5: thins · axial · 0.68mm/px · z∈[-362,-128]mm · 16 of 258 slices shown]
[im 12/258  lung]
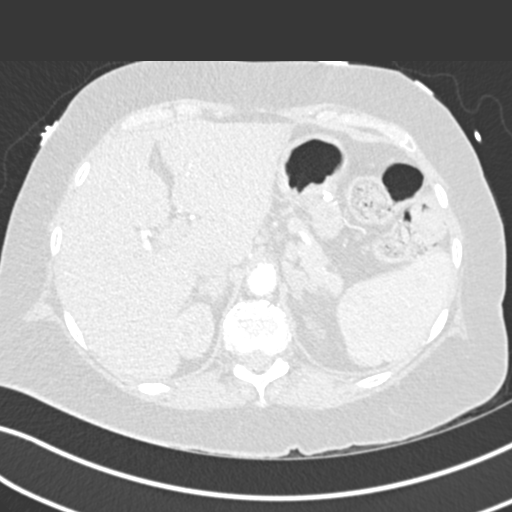
[im 34/258  soft-tissue]
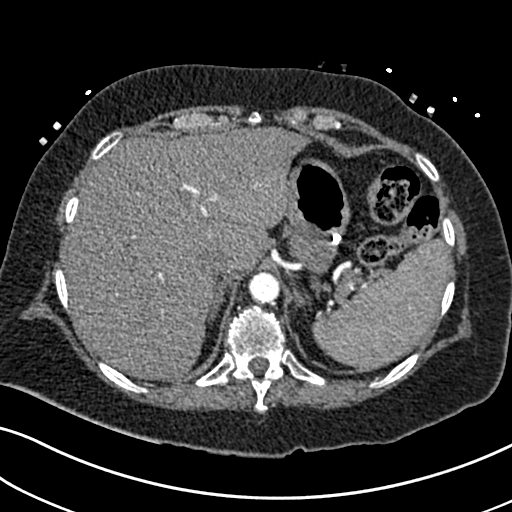
[im 45/258  lung]
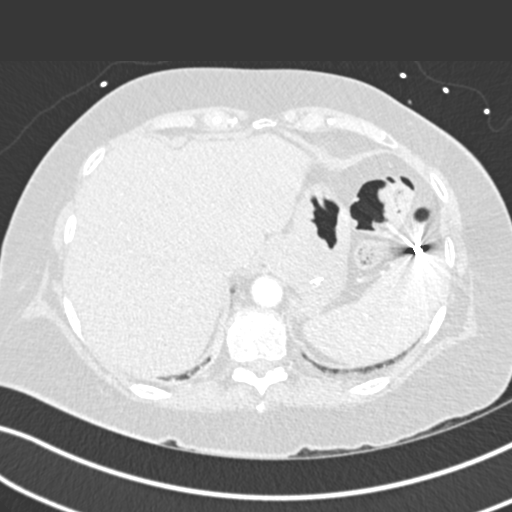
[im 56/258  soft-tissue]
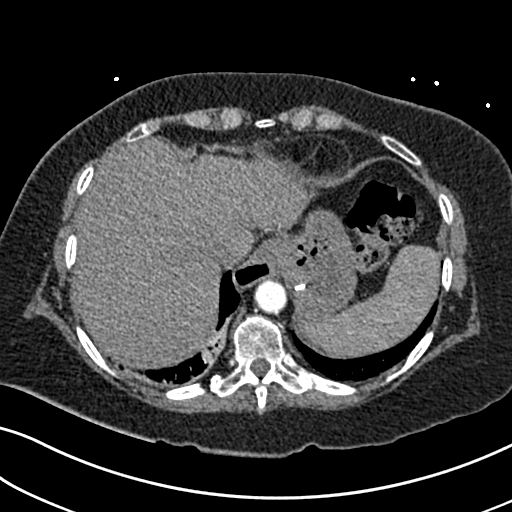
[im 79/258  lung]
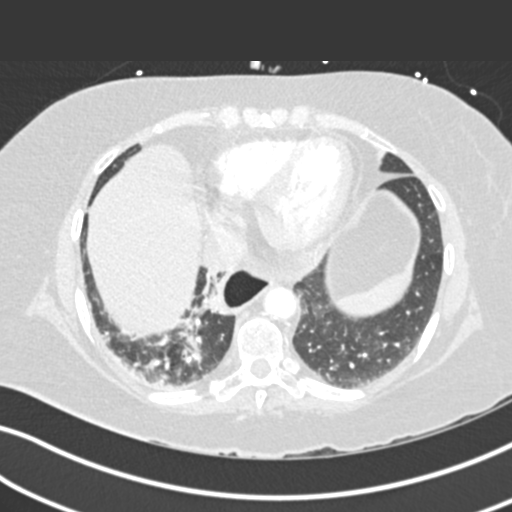
[im 90/258  soft-tissue]
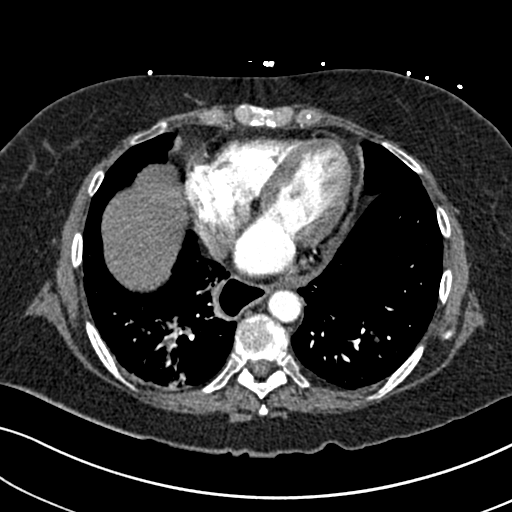
[im 101/258  lung]
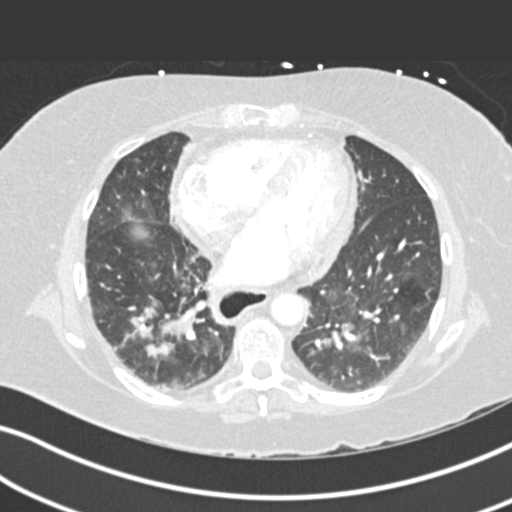
[im 123/258  soft-tissue]
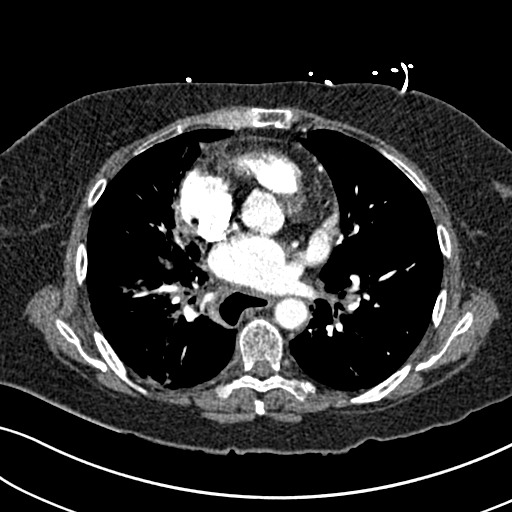
[im 135/258  lung]
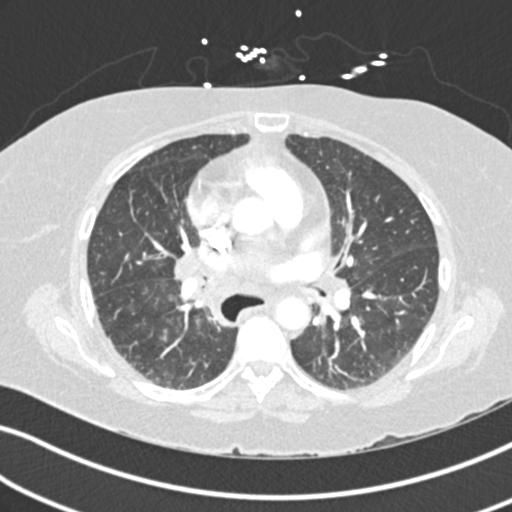
[im 157/258  soft-tissue]
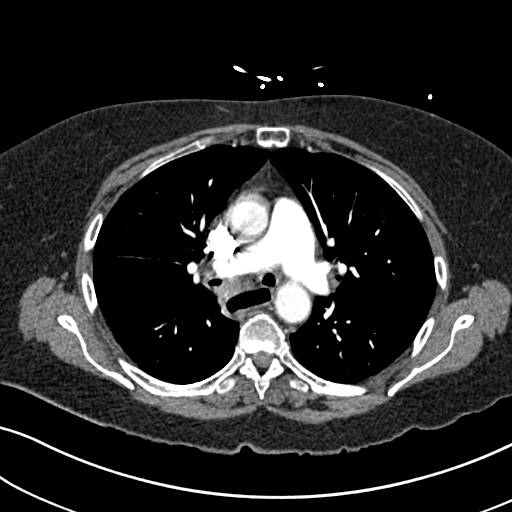
[im 168/258  lung]
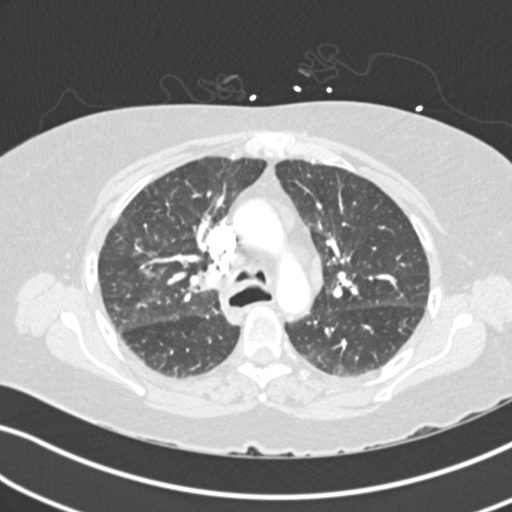
[im 179/258  soft-tissue]
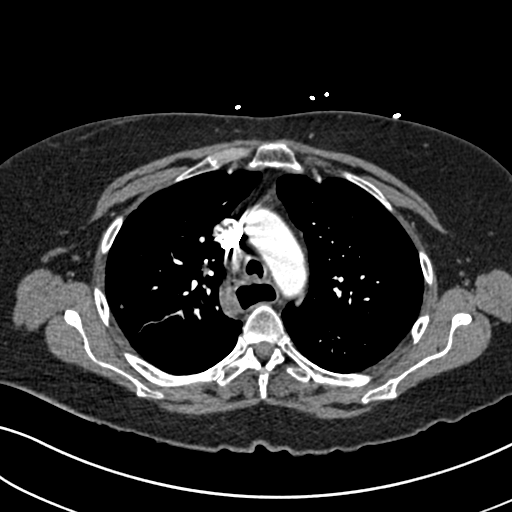
[im 202/258  lung]
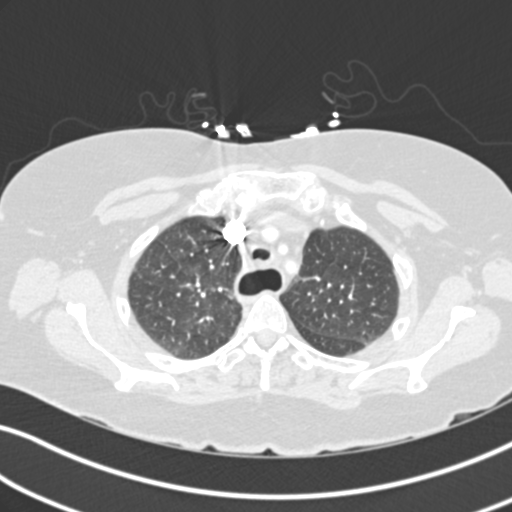
[im 213/258  soft-tissue]
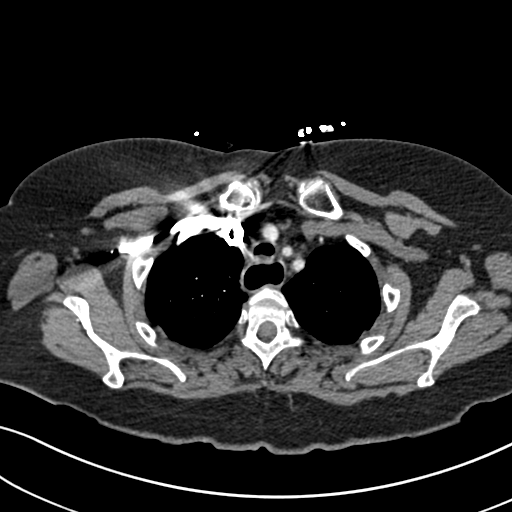
[im 224/258  lung]
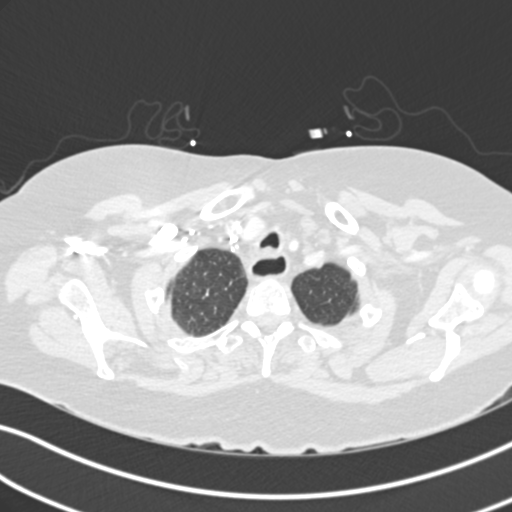
[im 246/258  soft-tissue]
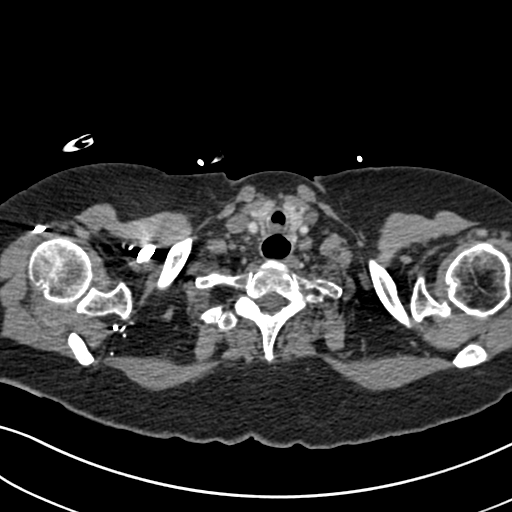

[Series 7: coronal mpr · coronal · 0.51mm/px · 2 of 82 slices shown]
[im 28/82  soft-tissue]
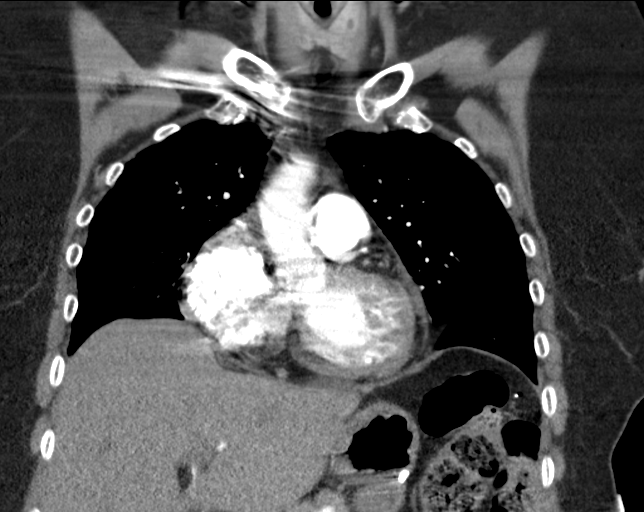
[im 55/82  soft-tissue]
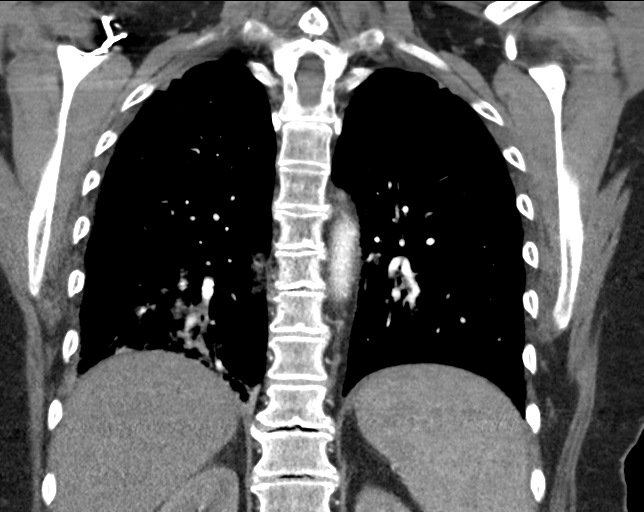

[18 of 46 positions shown; findings below may reference images not displayed]

FINDINGS: Cardiovascular: Adequate opacification of the pulmonary arteries to
the proximal subsegmental level. No evidence of acute pulmonary
embolus. Conventional 3 vessel arch anatomy. The aorta is normal in
size. No dissection. The heart is normal in size. No pericardial
effusion. Trace calcifications along the left anterior descending
coronary artery.

Mediastinum/Nodes: Diffusely patulous thoracic esophagus. Surgical
changes suggest prior gastric bypass operation. No suspicious
mediastinal mass or adenopathy.

Lungs/Pleura: Multifocal patchy ground-glass attenuation airspace
opacities noted in the right upper, bilateral lower and right middle
lobe. There is relative sparing of the left upper lobe. The
appearance is most suggestive of a multifocal infectious/
inflammatory process. There is also diffuse mild bronchial wall
thickening. The no pneumothorax or pleural effusion.

Upper Abdomen: Metallic radiopacity noted anterior to the spleen.
This is of uncertain etiology. Otherwise, the visualized upper
abdomen is unremarkable.

Musculoskeletal: No acute fracture or aggressive appearing lytic or
blastic osseous lesion.

Review of the MIP images confirms the above findings.
IMPRESSION: 1. The combination of diffuse mild bronchial wall thickening with
patchy ground-glass attenuation airspace opacities in a
peribronchovascular distribution involving the right upper, right
middle and bilateral lower lobes is most consistent with a
multifocal infectious/inflammatory process such as atypical or viral
pneumonia. Multifocal bronchopneumonia is also a possibility.
2. Negative for evidence of acute pulmonary embolus.
3. Trace calcifications along the left anterior descending coronary
artery.
4. Diffusely dilated and patulous thoracic esophagus. Does the
patient have a history of dysphagia, gastrointestinal reflux disease
or distal esophageal stricture?
5. Surgical changes of prior gastric bypass surgery.

## 2018-02-05 ENCOUNTER — Other Ambulatory Visit: Payer: Self-pay | Admitting: Family Medicine

## 2018-02-05 DIAGNOSIS — G8929 Other chronic pain: Secondary | ICD-10-CM

## 2018-02-05 DIAGNOSIS — K089 Disorder of teeth and supporting structures, unspecified: Principal | ICD-10-CM

## 2018-03-25 ENCOUNTER — Encounter: Payer: Self-pay | Admitting: Family Medicine

## 2018-03-25 ENCOUNTER — Other Ambulatory Visit: Payer: Self-pay | Admitting: Family Medicine

## 2018-03-25 DIAGNOSIS — I10 Essential (primary) hypertension: Secondary | ICD-10-CM

## 2018-03-25 NOTE — Telephone Encounter (Signed)
Pt contacted office for refill request on the following medications:  hydrochlorothiazide (HYDRODIURIL) 25 MG tablet   [facility] [place]. Please advise. Thanks TNP

## 2018-04-24 ENCOUNTER — Other Ambulatory Visit: Payer: Self-pay | Admitting: Family Medicine

## 2018-04-24 ENCOUNTER — Encounter: Payer: Self-pay | Admitting: Family Medicine

## 2018-04-24 DIAGNOSIS — G8929 Other chronic pain: Secondary | ICD-10-CM

## 2018-04-24 DIAGNOSIS — K089 Disorder of teeth and supporting structures, unspecified: Principal | ICD-10-CM

## 2018-04-24 NOTE — Telephone Encounter (Signed)
Please Review

## 2018-05-03 ENCOUNTER — Encounter: Payer: Self-pay | Admitting: Family Medicine

## 2018-05-03 ENCOUNTER — Other Ambulatory Visit: Payer: Self-pay | Admitting: Family Medicine

## 2018-05-03 ENCOUNTER — Other Ambulatory Visit: Payer: Self-pay

## 2018-05-03 ENCOUNTER — Ambulatory Visit (INDEPENDENT_AMBULATORY_CARE_PROVIDER_SITE_OTHER): Payer: Medicare Other | Admitting: Family Medicine

## 2018-05-03 VITALS — BP 160/87 | HR 94 | Temp 98.8°F | Resp 16 | Wt 128.0 lb

## 2018-05-03 DIAGNOSIS — F419 Anxiety disorder, unspecified: Secondary | ICD-10-CM

## 2018-05-03 DIAGNOSIS — I10 Essential (primary) hypertension: Secondary | ICD-10-CM | POA: Diagnosis not present

## 2018-05-03 NOTE — Progress Notes (Signed)
Patient: Joan Baldwin Female    DOB: [birth year]   73 y.o.   MRN: [id number] Visit Date: 05/03/2018  Today's Provider: Lelon Huh, MD   Chief Complaint  Patient presents with  . Hypertension   Subjective:     HPI  Hypertension, follow-up:  BP Readings from Last 3 Encounters:  05/03/18 (!) 160/87  12/02/17 136/70  11/02/17 (!) 144/78    She was last seen for hypertension 5 months ago.  BP at that visit was 136/70. Management since that visit includes no changes. She reports good compliance with treatment. She is not having side effects.  She is not exercising. She is adherent to low salt diet.   Outside blood pressures are 150-160/ 80. She is experiencing none.  Patient denies chest pain, chest pressure/discomfort, claudication, dyspnea, exertional chest pressure/discomfort, fatigue, irregular heart beat, lower extremity edema, near-syncope, orthopnea, palpitations, paroxysmal nocturnal dyspnea, syncope and tachypnea.   Cardiovascular risk factors include advanced age (older than 74 for men, 39 for women) and hypertension.  Use of agents associated with hypertension: NSAIDS.     Weight trend: decreasing steadily Wt Readings from Last 3 Encounters:  05/03/18 128 lb (58.1 kg)  12/02/17 135 lb (61.2 kg)  11/02/17 131 lb 3.2 oz (59.5 kg)    Current diet: well balanced  BMET    Component Value Date/Time   NA 134 12/02/2017 1115   K 5.2 12/02/2017 1115   CL 93 (L) 12/02/2017 1115   CO2 26 12/02/2017 1115   GLUCOSE 99 12/02/2017 1115   GLUCOSE 106 (H) 01/18/2016 0453   BUN 15 12/02/2017 1115   CREATININE 0.72 12/02/2017 1115   CALCIUM 9.4 12/02/2017 1115   GFRNONAA 83 12/02/2017 1115   GFRAA 95 12/02/2017 1115    ------------------------------------------------------------------------  Allergies  Allergen Reactions  . Gabapentin Rash  . Codeine Itching  . Tramadol Hcl Other (See Comments)    Insomnia  . Ultram  [Tramadol Hcl]     Insomnia      Current Outpatient Medications:  .  acetaminophen (TYLENOL) 500 MG tablet, Take 500 mg by mouth every 8 (eight) hours as needed. , Disp: , Rfl:  .  alendronate (FOSAMAX) 70 MG tablet, TAKE 1 TABLET BY MOUTH EVERY 7 DAYS. TAKE WITH A FULL GLASS OF WATER ON EMPTY STOMACH, Disp: 12 tablet, Rfl: 4 .  amLODipine (NORVASC) 10 MG tablet, TAKE 1 TABLET BY MOUTH ONCE DAILY, Disp: 90 tablet, Rfl: 4 .  aspirin 81 MG tablet, Take 1 tablet by mouth daily., Disp: , Rfl:  .  Calcium Carbonate-Vitamin D (CALCIUM 600/VITAMIN D PO), Take 1 tablet by mouth daily. , Disp: , Rfl:  .  diazepam (VALIUM) 10 MG tablet, Take 0.5-1 tablets (5-10 mg total) by mouth 4 (four) times daily as needed for anxiety., Disp: 120 tablet, Rfl: 5 .  Fiber POWD, Take 2 scoop by mouth daily., Disp: , Rfl:  .  hydrochlorothiazide (HYDRODIURIL) 25 MG tablet, Take 1 tablet by mouth once daily, Disp: 90 tablet, Rfl: 4 .  latanoprost (XALATAN) 0.005 % ophthalmic solution, Place 1 drop into both eyes at bedtime., Disp: , Rfl:  .  lidocaine (XYLOCAINE) 2 % solution, USE AS DIRECTED. 15 MLS IN THE MOUTH OR THROAT EVERY 6 HOURS AS NEEDED FOR MOUTH PAIN, Disp: 300 mL, Rfl: 3 .  lisinopril (PRINIVIL,ZESTRIL) 20 MG tablet, TAKE 1 TABLET BY MOUTH ONCE DAILY, Disp: 90 tablet, Rfl: 4 .  Multiple Vitamins-Minerals (MULTIVITAMIN ADULT PO), Take 1  tablet by mouth daily., Disp: , Rfl:  .  Omega-3 Fatty Acids (FISH OIL) 1000 MG CAPS, Take 1 capsule by mouth 2 (two) times daily., Disp: , Rfl:  .  omeprazole (PRILOSEC) 20 MG capsule, Take 1 capsule (20 mg total) by mouth daily., Disp: 30 capsule, Rfl: 12 .  Timolol Maleate 0.5 % (DAILY) SOLN, Place 1 drop into both eyes at bedtime., Disp: , Rfl:  .  valACYclovir (VALTREX) 1000 MG tablet, 2 tablets twice a day for 1 day as needed for cold sores, Disp: 16 tablet, Rfl: 1  Review of Systems  Constitutional: Negative for appetite change, chills, fatigue and fever.  Respiratory: Negative for chest tightness  and shortness of breath.   Cardiovascular: Negative for chest pain and palpitations.  Gastrointestinal: Negative for abdominal pain, nausea and vomiting.  Neurological: Negative for dizziness and weakness.    Social History   Tobacco Use  . Smoking status: Former Smoker    Packs/day: 1.00    Years: 30.00    Pack years: 30.00    Types: Cigarettes    Last attempt to quit: 01/11/2001    Years since quitting: 17.3  . Smokeless tobacco: Never Used  Substance Use Topics  . Alcohol use: No    Alcohol/week: 0.0 standard drinks      Objective:   BP (!) 160/87   Pulse 94   Temp 98.8 F (37.1 C) (Oral)   Resp 16   Wt 128 lb (58.1 kg)   SpO2 95% Comment: room air  BMI 20.66 kg/m  Vitals:   05/03/18 1107 05/03/18 1109  BP: (!) 144/81 (!) 160/87  Pulse: 94   Resp: 16   Temp: 98.8 F (37.1 C)   TempSrc: Oral   SpO2: 95%   Weight: 128 lb (58.1 kg)      Physical Exam   General Appearance:    Alert, cooperative, no distress  Eyes:    PERRL, conjunctiva/corneas clear, EOM's intact       Lungs:     Clear to auscultation bilaterally, respirations unlabored  Heart:    Regular rate and rhythm  Neurologic:   Awake, alert, oriented x 3. No apparent focal neurological           defect.          Assessment & Plan    1. Essential (primary) hypertension Systolics have been running a little high. She wants to increase dose of lisinopril, advised her that historically her potassium levels have been a little on the high side. She is reluctant to try any new medications, but agreed to try changing hctz to chlorthalidone if potassium is still high.  - Renal function panel     Lelon Huh, MD  [facility]

## 2018-05-03 NOTE — Patient Instructions (Signed)
.   Please review the attached list of medications and notify my office if there are any errors.   . Please bring all of your medications to every appointment so we can make sure that our medication list is the same as yours.    We will either increase the dose of lisinopril, or change hctz to chlorthalidone, depending on how high your potassium levels are.

## 2018-05-04 ENCOUNTER — Telehealth: Payer: Self-pay

## 2018-05-04 ENCOUNTER — Encounter: Payer: Self-pay | Admitting: Family Medicine

## 2018-05-04 LAB — RENAL FUNCTION PANEL
Albumin: 4.6 g/dL (ref 3.7–4.7)
BUN/Creatinine Ratio: 15 (ref 12–28)
BUN: 11 mg/dL (ref 8–27)
CO2: 25 mmol/L (ref 20–29)
Calcium: 9.8 mg/dL (ref 8.7–10.3)
Chloride: 89 mmol/L — ABNORMAL LOW (ref 96–106)
Creatinine, Ser: 0.71 mg/dL (ref 0.57–1.00)
GFR calc Af Amer: 96 mL/min/{1.73_m2} (ref >59)
GFR calc non Af Amer: 84 mL/min/{1.73_m2} (ref >59)
Glucose: 121 mg/dL — ABNORMAL HIGH (ref 65–99)
Phosphorus: 3.4 mg/dL (ref 3.0–4.3)
Potassium: 5.1 mmol/L (ref 3.5–5.2)
Sodium: 128 mmol/L — ABNORMAL LOW (ref 134–144)

## 2018-05-04 NOTE — Telephone Encounter (Signed)
-----   Message from Birdie Sons, MD sent at 05/04/2018  8:52 AM EDT ----- Potassium levels are high end of normal range. Can go ahead and increase lisinopril to 2 tablets daily. Follow up 1 month to check blood pressure and potassium levels.

## 2018-05-04 NOTE — Telephone Encounter (Signed)
Patient advised as directed below. Patient requesting a new prescription of the Lisinopril she stated that by taking 2 tablets daily she is going to run out pretty soon. She reports that she will have enough by May 12. She uses [facility].

## 2018-05-05 ENCOUNTER — Other Ambulatory Visit: Payer: Self-pay | Admitting: Family Medicine

## 2018-05-05 DIAGNOSIS — I10 Essential (primary) hypertension: Secondary | ICD-10-CM

## 2018-05-05 MED ORDER — LISINOPRIL 40 MG PO TABS: 40.0000 mg | ORAL_TABLET | Freq: Every day | ORAL | 0 refills | Status: DC

## 2018-05-05 NOTE — Telephone Encounter (Signed)
See My chart message

## 2018-05-05 NOTE — Telephone Encounter (Signed)
Pt had labs done Wednesday am and has not hears back or put in her My Chart  CB#  [phone]  Thanks Con Memos

## 2018-05-05 NOTE — Telephone Encounter (Signed)
Pt advised. Apt 06/06/2018. Please send refill to [place].

## 2018-05-06 ENCOUNTER — Encounter: Payer: Self-pay | Admitting: Family Medicine

## 2018-05-30 ENCOUNTER — Encounter: Payer: Self-pay | Admitting: Family Medicine

## 2018-05-31 ENCOUNTER — Encounter: Payer: Self-pay | Admitting: Family Medicine

## 2018-05-31 ENCOUNTER — Other Ambulatory Visit: Payer: Self-pay

## 2018-05-31 DIAGNOSIS — I10 Essential (primary) hypertension: Secondary | ICD-10-CM

## 2018-05-31 MED ORDER — LISINOPRIL 40 MG PO TABS: 40.0000 mg | ORAL_TABLET | Freq: Every day | ORAL | 5 refills | Status: DC

## 2018-06-01 ENCOUNTER — Telehealth: Payer: Self-pay | Admitting: Family Medicine

## 2018-06-01 NOTE — Telephone Encounter (Signed)
Patient sent my chart message requesting 90 day prescription for lisinopril, but I already sent prescription for #30 to [facility] yesterday. Can you call and see if they dispensed the 30 yet? If not, change to #90 with 2 refills. Thanks!

## 2018-06-01 NOTE — Telephone Encounter (Signed)
Can you verify that was the 40mg  lisinopril, not the 20mg . If so can leave it at #90 with 1 refill and let patient know it is ready to pick up. She sent me another mychart message about it this morning.

## 2018-06-01 NOTE — Telephone Encounter (Signed)
I called and spoke with pharmacist, who states that they already filled the prescription for a qty of 90 with 1 refill. Do you want me to send in a prescription for qty 90 with no refills?

## 2018-06-02 DIAGNOSIS — H2511 Age-related nuclear cataract, right eye: Secondary | ICD-10-CM | POA: Diagnosis not present

## 2018-06-02 NOTE — Telephone Encounter (Signed)
I called pharmacy and verified that the prescription was for Lisinopril 40mg . Qty 90 with 1 refill.

## 2018-06-02 NOTE — Telephone Encounter (Signed)
Patient is aware that refill is ready.

## 2018-06-06 ENCOUNTER — Ambulatory Visit (INDEPENDENT_AMBULATORY_CARE_PROVIDER_SITE_OTHER): Payer: Medicare Other

## 2018-06-06 ENCOUNTER — Other Ambulatory Visit: Payer: Self-pay

## 2018-06-06 DIAGNOSIS — Z0000 Encounter for general adult medical examination without abnormal findings: Secondary | ICD-10-CM

## 2018-06-06 NOTE — Progress Notes (Signed)
Subjective:   Joan Baldwin is a 73 y.o. female who presents for Medicare Annual (Subsequent) preventive examination.    This visit is being conducted through telemedicine due to the COVID-19 pandemic. This patient has given me verbal consent via doximity to conduct this visit, patient states they are participating from their home address. Some vital signs may be absent or patient reported.    Patient identification: identified by name, DOB, and current address  Review of Systems:  N/A  Cardiac Risk Factors include: advanced age (>9men, >26 women);hypertension     Objective:     Vitals: There were no vitals taken for this visit.  There is no height or weight on file to calculate BMI. Unable to obtain vitals due to visit being conducted via telephonically.   Advanced Directives 06/06/2018 05/31/2017 05/26/2016 01/17/2016 01/17/2016  Does Patient Have a Medical Advance Directive? Yes No No No No  Type of Paramedic of [place];Living will - - - -  Copy of [place] in Chart? Yes - validated most recent copy scanned in chart (See row information) - - - -  Would patient like information on creating a medical advance directive? - Yes (MAU/Ambulatory/Procedural Areas - Information given) Yes (ED - Information included in AVS) No - Patient declined -    Tobacco Social History   Tobacco Use  Smoking Status Former Smoker   Packs/day: 1.00   Years: 30.00   Pack years: 30.00   Types: Cigarettes   Last attempt to quit: 01/11/2001   Years since quitting: 17.4  Smokeless Tobacco Never Used     Counseling given: Not Answered   Clinical Intake:  Pre-visit preparation completed: Yes  Pain : No/denies pain Pain Score: 0-No pain     Nutritional Status: BMI of 19-24  Normal Nutritional Risks: None Diabetes: No  How often do you need to have someone help you when you read instructions, pamphlets, or other written materials from your  doctor or pharmacy?: 1 - Never  Interpreter Needed?: No  Information entered by :: [facility], LPN  Past Medical History:  Diagnosis Date   Cataract    Glaucoma    History of chicken pox    Hypertension    Osteoporosis    Sepsis ([place]) 01/17/2016   Past Surgical History:  Procedure Laterality Date   ABDOMINAL HYSTERECTOMY     vaginal. Still has both ovaries   back surgeries     x 2   COLONOSCOPY  2007   done in [place] per patient. Normal except for moderate hemorrhoids   MOUTH SURGERY  2011   STOMACH SURGERY     staples for obesity   Tailbone surgery     Family History  Problem Relation Age of Onset   Emphysema Mother    Kidney failure Sister    Heart Problems Brother    Diabetes Brother    Skin cancer Brother    Breast cancer Neg Hx    Social History   Socioeconomic History   Marital status: Married    Spouse name: Not on file   Number of children: 3   Years of education: Not on file   Highest education level: 12th grade  Occupational History   Occupation: Retired  Scientist, product/process development strain: Not hard at [employer] insecurity:    Worry: Never true    Inability: Never true   Transportation needs:    Medical: No  Non-medical: No  Tobacco Use   Smoking status: Former Smoker    Packs/day: 1.00    Years: 30.00    Pack years: 30.00    Types: Cigarettes    Last attempt to quit: 01/11/2001    Years since quitting: 17.4   Smokeless tobacco: Never Used  Substance and Sexual Activity   Alcohol use: No    Alcohol/week: 0.0 standard drinks   Drug use: No   Sexual activity: Not on file  Lifestyle   Physical activity:    Days per week: 0 days    Minutes per session: 0 min   Stress: Only a little  Relationships   Social connections:    Talks on phone: Patient refused    Gets together: Patient refused    Attends religious service: Patient refused    Active member of club or organization: Patient refused     Attends meetings of clubs or organizations: Patient refused    Relationship status: Patient refused  Other Topics Concern   Not on file  Social History Narrative   Not on file    Outpatient Encounter Medications as of 06/06/2018  Medication Sig   acetaminophen (TYLENOL) 500 MG tablet Take 500 mg by mouth every 8 (eight) hours as needed.    alendronate (FOSAMAX) 70 MG tablet TAKE 1 TABLET BY MOUTH EVERY 7 DAYS. TAKE WITH A FULL GLASS OF WATER ON EMPTY STOMACH   amLODipine (NORVASC) 10 MG tablet TAKE 1 TABLET BY MOUTH ONCE DAILY   aspirin 81 MG tablet Take 1 tablet by mouth daily.   Calcium Carbonate-Vitamin D (CALCIUM 600/VITAMIN D PO) Take 1 tablet by mouth daily.    diazepam (VALIUM) 10 MG tablet TAKE 1/2 TO 1 (ONE-HALF TO ONE) TABLET BY MOUTH 4 TIMES DAILY AS NEEDED FOR ANXIETY   Fiber POWD Take 2 scoop by mouth daily.   hydrochlorothiazide (HYDRODIURIL) 25 MG tablet Take 1 tablet by mouth once daily   latanoprost (XALATAN) 0.005 % ophthalmic solution Place 1 drop into both eyes at bedtime.   lidocaine (XYLOCAINE) 2 % solution USE AS DIRECTED. 15 MLS IN THE MOUTH OR THROAT EVERY 6 HOURS AS NEEDED FOR MOUTH PAIN   lisinopril (ZESTRIL) 40 MG tablet Take 1 tablet (40 mg total) by mouth daily.   Multiple Vitamins-Minerals (MULTIVITAMIN ADULT PO) Take 1 tablet by mouth daily.   Omega-3 Fatty Acids (FISH OIL) 1000 MG CAPS Take 1 capsule by mouth daily.    omeprazole (PRILOSEC) 20 MG capsule Take 1 capsule (20 mg total) by mouth daily.   Timolol Maleate 0.5 % (DAILY) SOLN Place 1 drop into both eyes at bedtime.   valACYclovir (VALTREX) 1000 MG tablet 2 tablets twice a day for 1 day as needed for cold sores   No facility-administered encounter medications on file as of 06/06/2018.     Activities of Daily Living In your present state of health, do you have any difficulty performing the following activities: 06/06/2018  Hearing? N  Vision? N  Difficulty concentrating or  making decisions? N  Walking or climbing stairs? N  Dressing or bathing? N  Doing errands, shopping? N  Preparing Food and eating ? N  Using the Toilet? N  In the past six months, have you accidently leaked urine? N  Do you have problems with loss of bowel control? N  Managing your Medications? N  Managing your Finances? N  Housekeeping or managing your Housekeeping? N  Some recent data might be hidden    Patient  Care Team: Birdie Sons, MD as PCP - General (Family Medicine) Birder Robson, MD as Referring Physician (Ophthalmology)    Assessment:   This is a routine wellness examination for Joan Baldwin.  Exercise Activities and Dietary recommendations Current Exercise Habits: The patient does not participate in regular exercise at present, Exercise limited by: None identified  Goals     DIET - INCREASE WATER INTAKE     Recommend increasing water intake to 4-6 glasses a day.      Exercise 3x per week (30 min per time)     Recommend exercising 4 days a week for 30 minutes. Pt plans to do this once feeling better.      LIFESTYLE - DECREASE FALLS RISK     Recommend to remove any items from the home that may cause slips or trips.       Fall Risk: Fall Risk  06/06/2018 05/31/2017 05/26/2016 11/26/2014  Falls in the past year? 1 No No No  Number falls in past yr: 0 - - -  Injury with Fall? 0 - - -  Follow up Falls prevention discussed - - -    FALL RISK PREVENTION PERTAINING TO THE HOME:  Any stairs in or around the home? Yes  If so, are there any without handrails? Yes   Home free of loose throw rugs in walkways, pet beds, electrical cords, etc? Yes  Adequate lighting in your home to reduce risk of falls? Yes   ASSISTIVE DEVICES UTILIZED TO PREVENT FALLS:  Life alert? No  Use of a cane, walker or w/c? No  Grab bars in the bathroom? Yes  Shower chair or bench in shower? No  Elevated toilet seat or a handicapped toilet? No    TIMED UP AND GO:  Was the test  performed? No .    Depression Screen PHQ 2/9 Scores 06/06/2018 05/31/2017 05/26/2016 05/26/2016  PHQ - 2 Score 0 1 0 0  PHQ- 9 Score - - 0 -     Cognitive Function     6CIT Screen 06/06/2018 05/26/2016  What Year? 0 points 0 points  What month? 0 points 0 points  What time? 0 points 0 points  Count back from 20 0 points 0 points  Months in reverse 2 points 0 points  Repeat phrase 0 points 6 points  Total Score 2 6    Immunization History  Administered Date(s) Administered   Influenza, High Dose Seasonal PF 11/26/2014, 09/05/2015, 10/10/2016, 10/03/2017   Pneumococcal Conjugate-13 11/26/2014   Pneumococcal Polysaccharide-23 09/10/2008, 12/19/2009   Zoster 11/21/2012    Qualifies for Shingles Vaccine? Yes  Zostavax completed 11/21/12. Due for Shingrix. Education has been provided regarding the importance of this vaccine. Pt has been advised to call insurance company to determine out of pocket expense. Advised may also receive vaccine at local pharmacy or Health Dept. Verbalized acceptance and understanding.  Tdap: Although this vaccine is not a covered service during a Wellness Exam, does the patient still wish to receive this vaccine today?  No . Advised may receive this vaccine at local pharmacy or Health Dept. Aware to provide a copy of the vaccination record if obtained from local pharmacy or Health Dept. Verbalized acceptance and understanding.  Flu Vaccine: Up to date  Pneumococcal Vaccine: Up to date  Screening Tests Health Maintenance  Topic Date Due   Hepatitis C Screening  [birth year]   FOOT EXAM  02/08/1955   TETANUS/TDAP  02/08/1962   HEMOGLOBIN A1C  06/02/2018   DEXA  SCAN  07/29/2018   INFLUENZA VACCINE  08/12/2018   OPHTHALMOLOGY EXAM  06/02/2019   COLONOSCOPY  12/23/2021   PNA vac Low Risk Adult  Completed    Cancer Screenings:  Colorectal Screening: Completed 12/23/16. Repeat every 5 years.  Mammogram: Completed 08/15/17.   Bone Density:  Completed 07/28/16. Results reflect OSTEOPOROSIS. Repeat every 2 years.   Lung Cancer Screening: (Low Dose CT Chest recommended if Age 57-80 years, 30 pack-year currently smoking OR have quit w/in 15years.) does not qualify.   Additional Screening:  Hepatitis C Screening: does qualify; pt would like this added to BW orders at next in office visit.   Vision Screening: Recommended annual ophthalmology exams for early detection of glaucoma and other disorders of the eye.  Dental Screening: Recommended annual dental exams for proper oral hygiene  Community Resource Referral:  CRR required this visit?  No       Plan:  I have personally reviewed and addressed the Medicare Annual Wellness questionnaire and have noted the following in the patients chart:  A. Medical and social history B. Use of alcohol, tobacco or illicit drugs  C. Current medications and supplements D. Functional ability and status E.  Nutritional status F.  Physical activity G. Advance directives H. List of other physicians I.  Hospitalizations, surgeries, and ER visits in previous 12 months J.  [place] such as hearing and vision if needed, cognitive and depression L. Referrals and appointments   In addition, I have reviewed and discussed with patient certain preventive protocols, quality metrics, and best practice recommendations. A written personalized care plan for preventive services as well as general preventive health recommendations were provided to patient. Nurse Health Advisor  Signed,    Brandt Chaney [place], [place]  [id number]/45/8592 Nurse Health Advisor   Nurse Notes: Pt needs a diabetic foot exam, her Hgb A1c checked and the Hep C lab added to her BW orders at next in office visit. Pt declined a future order for a tetanus vaccine.

## 2018-06-06 NOTE — Telephone Encounter (Signed)
See telephone message 06-01-2018

## 2018-06-06 NOTE — Patient Instructions (Addendum)
Joan Baldwin , Thank you for taking time to come for your Medicare Wellness Visit. I appreciate your ongoing commitment to your health goals. Please review the following plan we discussed and let me know if I can assist you in the future.   Screening recommendations/referrals: Colonoscopy: Up to date, due 12/2021 Mammogram: Up to date, due 08/2019 Bone Density: Up to date, due 07/2018 Recommended yearly ophthalmology/optometry visit for glaucoma screening and checkup Recommended yearly dental visit for hygiene and checkup  Vaccinations: Influenza vaccine: Up to date Pneumococcal vaccine: Completed series Tdap vaccine: Pt declines today.  Shingles vaccine: Pt declines today.     Advanced directives: Currently on file.   Conditions/risks identified: Fall risk prevention discussed today. Recommend to continue trying to exercise as tolerated.   Next appointment: 08/30/18 @ 10 AM with Dr Caryn Section.    Preventive Care 30 Years and Older, Female Preventive care refers to lifestyle choices and visits with your health care provider that can promote health and wellness. What does preventive care include?  A yearly physical exam. This is also called an annual well check.  Dental exams once or twice a year.  Routine eye exams. Ask your health care provider how often you should have your eyes checked.  Personal lifestyle choices, including:  Daily care of your teeth and gums.  Regular physical activity.  Eating a healthy diet.  Avoiding tobacco and drug use.  Limiting alcohol use.  Practicing safe sex.  Taking low-dose aspirin every day.  Taking vitamin and mineral supplements as recommended by your health care provider. What happens during an annual well check? The services and screenings done by your health care provider during your annual well check will depend on your age, overall health, lifestyle risk factors, and family history of disease. Counseling  Your health care  provider may ask you questions about your:  Alcohol use.  Tobacco use.  Drug use.  Emotional well-being.  Home and relationship well-being.  Sexual activity.  Eating habits.  History of falls.  Memory and ability to understand (cognition).  Work and work Statistician.  Reproductive health. Screening  You may have the following tests or measurements:  Height, weight, and BMI.  Blood pressure.  Lipid and cholesterol levels. These may be checked every 5 years, or more frequently if you are over 53 years old.  Skin check.  Lung cancer screening. You may have this screening every year starting at age 5 if you have a 30-pack-year history of smoking and currently smoke or have quit within the past 15 years.  Fecal occult blood test (FOBT) of the stool. You may have this test every year starting at age 36.  Flexible sigmoidoscopy or colonoscopy. You may have a sigmoidoscopy every 5 years or a colonoscopy every 10 years starting at age 35.  Hepatitis C blood test.  Hepatitis B blood test.  Sexually transmitted disease (STD) testing.  Diabetes screening. This is done by checking your blood sugar (glucose) after you have not eaten for a while (fasting). You may have this done every 1-3 years.  Bone density scan. This is done to screen for osteoporosis. You may have this done starting at age 66.  Mammogram. This may be done every 1-2 years. Talk to your health care provider about how often you should have regular mammograms. Talk with your health care provider about your test results, treatment options, and if necessary, the need for more tests. Vaccines  Your health care provider may recommend certain vaccines, such  as:  Influenza vaccine. This is recommended every year.  Tetanus, diphtheria, and acellular pertussis (Tdap, Td) vaccine. You may need a Td booster every 10 years.  Zoster vaccine. You may need this after age 34.  Pneumococcal 13-valent conjugate (PCV13)  vaccine. One dose is recommended after age 2.  Pneumococcal polysaccharide (PPSV23) vaccine. One dose is recommended after age 18. Talk to your health care provider about which screenings and vaccines you need and how often you need them. This information is not intended to replace advice given to you by your health care provider. Make sure you discuss any questions you have with your health care provider. Document Released: 01/24/2015 Document Revised: 09/17/2015 Document Reviewed: 10/29/2014 Elsevier Interactive Patient Education  2017 [place] Prevention in the Home Falls can cause injuries. They can happen to people of all ages. There are many things you can do to make your home safe and to help prevent falls. What can I do on the outside of my home?  Regularly fix the edges of walkways and driveways and fix any cracks.  Remove anything that might make you trip as you walk through a door, such as a raised step or threshold.  Trim any bushes or trees on the path to your home.  Use bright outdoor lighting.  Clear any walking paths of anything that might make someone trip, such as rocks or tools.  Regularly check to see if handrails are loose or broken. Make sure that both sides of any steps have handrails.  Any raised decks and porches should have guardrails on the edges.  Have any leaves, snow, or ice cleared regularly.  Use sand or salt on walking paths during winter.  Clean up any spills in your garage right away. This includes oil or grease spills. What can I do in the bathroom?  Use night lights.  Install grab bars by the toilet and in the tub and shower. Do not use towel bars as grab bars.  Use non-skid mats or decals in the tub or shower.  If you need to sit down in the shower, use a plastic, non-slip stool.  Keep the floor dry. Clean up any water that spills on the floor as soon as it happens.  Remove soap buildup in the tub or shower regularly.   Attach bath mats securely with double-sided non-slip rug tape.  Do not have throw rugs and other things on the floor that can make you trip. What can I do in the bedroom?  Use night lights.  Make sure that you have a light by your bed that is easy to reach.  Do not use any sheets or blankets that are too big for your bed. They should not hang down onto the floor.  Have a firm chair that has side arms. You can use this for support while you get dressed.  Do not have throw rugs and other things on the floor that can make you trip. What can I do in the kitchen?  Clean up any spills right away.  Avoid walking on wet floors.  Keep items that you use a lot in easy-to-reach places.  If you need to reach something above you, use a strong step stool that has a grab bar.  Keep electrical cords out of the way.  Do not use floor polish or wax that makes floors slippery. If you must use wax, use non-skid floor wax.  Do not have throw rugs and other things on the  floor that can make you trip. What can I do with my stairs?  Do not leave any items on the stairs.  Make sure that there are handrails on both sides of the stairs and use them. Fix handrails that are broken or loose. Make sure that handrails are as long as the stairways.  Check any carpeting to make sure that it is firmly attached to the stairs. Fix any carpet that is loose or worn.  Avoid having throw rugs at the top or bottom of the stairs. If you do have throw rugs, attach them to the floor with carpet tape.  Make sure that you have a light switch at the top of the stairs and the bottom of the stairs. If you do not have them, ask someone to add them for you. What else can I do to help prevent falls?  Wear shoes that:  Do not have high heels.  Have rubber bottoms.  Are comfortable and fit you well.  Are closed at the toe. Do not wear sandals.  If you use a stepladder:  Make sure that it is fully opened. Do not climb  a closed stepladder.  Make sure that both sides of the stepladder are locked into place.  Ask someone to hold it for you, if possible.  Clearly mark and make sure that you can see:  Any grab bars or handrails.  First and last steps.  Where the edge of each step is.  Use tools that help you move around (mobility aids) if they are needed. These include:  Canes.  Walkers.  Scooters.  Crutches.  Turn on the lights when you go into a dark area. Replace any light bulbs as soon as they burn out.  Set up your furniture so you have a clear path. Avoid moving your furniture around.  If any of your floors are uneven, fix them.  If there are any pets around you, be aware of where they are.  Review your medicines with your doctor. Some medicines can make you feel dizzy. This can increase your chance of falling. Ask your doctor what other things that you can do to help prevent falls. This information is not intended to replace advice given to you by your health care provider. Make sure you discuss any questions you have with your health care provider. Document Released: 10/24/2008 Document Revised: 06/05/2015 Document Reviewed: 02/01/2014 Elsevier Interactive Patient Education  2017 [employer].

## 2018-06-07 ENCOUNTER — Telehealth: Payer: Self-pay

## 2018-06-07 DIAGNOSIS — I10 Essential (primary) hypertension: Secondary | ICD-10-CM

## 2018-06-07 NOTE — Telephone Encounter (Signed)
Pt was in the office 05/03/2018 for hypertension.  Her lisinopril was increased to two tablets a day with the instructions to return in one month for lab work and BP check.  Pt states she is not able to come in sooner that August 19 secondary to her husband having heart surgery and she is needing to be with him.    She reports she is tolerating increase of lisinopril well with no known side effects.  She states her top number are "never above 130" and she does not pay attention to her bottom numbers but she "thinks they are in the 60's."   Is it okay if she waits until August or do you need to see her sooner?  I advised pt you will not be back in the office until Friday and she was okay with waiting until then.    Please advise.   Thanks,   -Mickel Baas

## 2018-06-09 NOTE — Telephone Encounter (Signed)
Pt advised.  She will come by Wednesday or Thursday next week.   Thanks,  -Mickel Baas

## 2018-06-09 NOTE — Telephone Encounter (Signed)
She doesn't necessarily need an office visit, but she does need her potassium levels checked. Lisinopril will sometimes cause potassium levels to become dangerously high, so the need to be checked whenever the dose is adjusted. She can stop by lab next week just to get blood drawn to check potassium. Haven entered pended order.

## 2018-06-14 DIAGNOSIS — I10 Essential (primary) hypertension: Secondary | ICD-10-CM | POA: Diagnosis not present

## 2018-06-15 LAB — POTASSIUM: Potassium: 4.4 mmol/L (ref 3.5–5.2)

## 2018-06-30 ENCOUNTER — Other Ambulatory Visit: Payer: Self-pay | Admitting: Family Medicine

## 2018-06-30 DIAGNOSIS — B009 Herpesviral infection, unspecified: Secondary | ICD-10-CM

## 2018-07-03 ENCOUNTER — Other Ambulatory Visit: Payer: Self-pay | Admitting: Family Medicine

## 2018-07-03 DIAGNOSIS — G8929 Other chronic pain: Secondary | ICD-10-CM

## 2018-07-03 DIAGNOSIS — K089 Disorder of teeth and supporting structures, unspecified: Secondary | ICD-10-CM

## 2018-07-20 ENCOUNTER — Other Ambulatory Visit: Payer: Self-pay | Admitting: Family Medicine

## 2018-07-20 DIAGNOSIS — G8929 Other chronic pain: Secondary | ICD-10-CM

## 2018-07-30 ENCOUNTER — Other Ambulatory Visit: Payer: Self-pay | Admitting: Family Medicine

## 2018-07-30 DIAGNOSIS — F419 Anxiety disorder, unspecified: Secondary | ICD-10-CM

## 2018-08-20 ENCOUNTER — Encounter: Payer: Self-pay | Admitting: Family Medicine

## 2018-08-30 ENCOUNTER — Other Ambulatory Visit: Payer: Self-pay

## 2018-08-30 ENCOUNTER — Encounter: Payer: Self-pay | Admitting: Family Medicine

## 2018-08-30 ENCOUNTER — Ambulatory Visit (INDEPENDENT_AMBULATORY_CARE_PROVIDER_SITE_OTHER): Payer: Medicare Other | Admitting: Family Medicine

## 2018-08-30 VITALS — BP 129/65 | HR 80 | Temp 97.3°F | Ht 66.5 in | Wt 128.0 lb

## 2018-08-30 DIAGNOSIS — Z1159 Encounter for screening for other viral diseases: Secondary | ICD-10-CM

## 2018-08-30 DIAGNOSIS — Z0000 Encounter for general adult medical examination without abnormal findings: Secondary | ICD-10-CM

## 2018-08-30 DIAGNOSIS — R7303 Prediabetes: Secondary | ICD-10-CM

## 2018-08-30 DIAGNOSIS — M810 Age-related osteoporosis without current pathological fracture: Secondary | ICD-10-CM

## 2018-08-30 DIAGNOSIS — I10 Essential (primary) hypertension: Secondary | ICD-10-CM

## 2018-08-30 DIAGNOSIS — R768 Other specified abnormal immunological findings in serum: Secondary | ICD-10-CM | POA: Diagnosis not present

## 2018-08-30 DIAGNOSIS — K089 Disorder of teeth and supporting structures, unspecified: Secondary | ICD-10-CM

## 2018-08-30 DIAGNOSIS — G8929 Other chronic pain: Secondary | ICD-10-CM | POA: Insufficient documentation

## 2018-08-30 MED ORDER — LIDOCAINE VISCOUS HCL 2 % MT SOLN: OROMUCOSAL | 5 refills | Status: DC

## 2018-08-30 NOTE — Patient Instructions (Signed)
.   Please review the attached list of medications and notify my office if there are any errors.   . Please bring all of your medications to every appointment so we can make sure that our medication list is the same as yours.   . We will have flu vaccines available after Labor Day. Please go to your pharmacy or call the office in early September to schedule you flu shot.   

## 2018-08-30 NOTE — Progress Notes (Signed)
Patient: Joan Baldwin, Female    DOB: [birth year], 73 y.o.   MRN: [id number] Visit Date: 08/30/2018  Today's Provider: Lelon Huh, MD   Chief Complaint  Patient presents with  . Annual Exam   Subjective:     Complete Physical [name] is a 73 y.o. female. She feels fairly well.  Pt reports she had a shingles vaccine about two weeks ago.  She states her arm is still very sore.  She reports exercising some. She reports she is sleeping well.  ----------------------------------------------------------- Follow up hypertension BP Readings from Last 3 Encounters:  08/30/18 129/65  05/03/18 (!) 160/87  12/02/17 136/70   Doubled lisinopril from 20 to 40mg  after April office visit. Is tolerating well with no adverse effects.    Follow up osteoporosis, is taking alendronate consistently every week, tolerating well with no adverse effects. Last BMD was in 2018. Her husband has been ill and she would like to hold off on mammogram and BMD since she is so busy taking care of him for the time being.       Review of Systems  Constitutional: Positive for activity change. Negative for appetite change, chills, diaphoresis, fatigue, fever and unexpected weight change.  HENT: Negative.   Eyes: Negative.   Respiratory: Negative.   Cardiovascular: Negative.   Gastrointestinal: Positive for constipation. Negative for abdominal distention, abdominal pain, anal bleeding, blood in stool, diarrhea, nausea, rectal pain and vomiting.  Endocrine: Negative.   Genitourinary: Negative.   Musculoskeletal: Negative.   Skin: Negative.   Allergic/Immunologic: Negative.   Neurological: Negative.   Hematological: Negative.   Psychiatric/Behavioral: Negative for agitation, behavioral problems, confusion, decreased concentration, dysphoric mood, hallucinations, self-injury, sleep disturbance and suicidal ideas. The patient is nervous/anxious. The patient is not hyperactive.     Social  History   Socioeconomic History  . Marital status: Married    Spouse name: Not on file  . Number of children: 3  . Years of education: Not on file  . Highest education level: 12th grade  Occupational History  . Occupation: Retired  Scientific laboratory technician  . Financial resource strain: Not hard at all  . Food insecurity    Worry: Never true    Inability: Never true  . Transportation needs    Medical: No    Non-medical: No  Tobacco Use  . Smoking status: Former Smoker    Packs/day: 1.00    Years: 30.00    Pack years: 30.00    Types: Cigarettes    Quit date: 01/11/2001    Years since quitting: 17.6  . Smokeless tobacco: Never Used  Substance and Sexual Activity  . Alcohol use: No    Alcohol/week: 0.0 standard drinks  . Drug use: No  . Sexual activity: Not on file  Lifestyle  . Physical activity    Days per week: 0 days    Minutes per session: 0 min  . Stress: Only a little  Relationships  . Social Herbalist on phone: Patient refused    Gets together: Patient refused    Attends religious service: Patient refused    Active member of club or organization: Patient refused    Attends meetings of clubs or organizations: Patient refused    Relationship status: Patient refused  . Intimate partner violence    Fear of current or ex partner: Patient refused    Emotionally abused: Patient refused    Physically abused: Patient refused    Forced  sexual activity: Patient refused  Other Topics Concern  . Not on file  Social History Narrative  . Not on file    Past Medical History:  Diagnosis Date  . Cataract   . Glaucoma   . History of chicken pox   . Hypertension   . Osteoporosis   . Sepsis ([place]) 01/17/2016     Patient Active Problem List   Diagnosis Date Noted  . History of adenomatous polyp of colon 12/29/2016  . History of smoking 30 or more pack years 05/26/2016  . Esophageal abnormality 02/03/2016  . Glaucoma 11/22/2014  . Skin lesion 11/22/2014  . Osteoporosis  02/13/2013  . Pre-diabetes 08/10/2009  . Essential (primary) hypertension 05/06/2006  . Anxiety 01/11/2001  . Insomnia 01/11/2001  . GERD (gastroesophageal reflux disease) 01/11/2001    Past Surgical History:  Procedure Laterality Date  . ABDOMINAL HYSTERECTOMY     vaginal. Still has both ovaries  . back surgeries     x 2  . COLONOSCOPY  2007   done in [place] per patient. Normal except for moderate hemorrhoids  . MOUTH SURGERY  2011  . STOMACH SURGERY     staples for obesity  . Tailbone surgery      Her family history includes Diabetes in her brother; Emphysema in her mother; Heart Problems in her brother; Kidney failure in her sister; Skin cancer in her brother. There is no history of Breast cancer.   Current Outpatient Medications:  .  acetaminophen (TYLENOL) 500 MG tablet, Take 500 mg by mouth every 8 (eight) hours as needed. , Disp: , Rfl:  .  alendronate (FOSAMAX) 70 MG tablet, TAKE 1 TABLET BY MOUTH EVERY 7 DAYS. TAKE WITH A FULL GLASS OF WATER ON EMPTY STOMACH, Disp: 12 tablet, Rfl: 4 .  amLODipine (NORVASC) 10 MG tablet, TAKE 1 TABLET BY MOUTH ONCE DAILY, Disp: 90 tablet, Rfl: 4 .  aspirin 81 MG tablet, Take 1 tablet by mouth daily., Disp: , Rfl:  .  Calcium Carbonate-Vitamin D (CALCIUM 600/VITAMIN D PO), Take 1 tablet by mouth daily. , Disp: , Rfl:  .  diazepam (VALIUM) 10 MG tablet, TAKE 1/2 TO 1 TABLET BY MOUTH 4 TIMES DAILY AS NEEDED FOR ANXIETY, Disp: 120 tablet, Rfl: 3 .  Fiber POWD, Take 2 scoop by mouth daily., Disp: , Rfl:  .  hydrochlorothiazide (HYDRODIURIL) 25 MG tablet, Take 1 tablet by mouth once daily, Disp: 90 tablet, Rfl: 4 .  latanoprost (XALATAN) 0.005 % ophthalmic solution, Place 1 drop into both eyes at bedtime., Disp: , Rfl:  .  lidocaine (XYLOCAINE) 2 % solution, USE AS DIRECTED. 15 ML IN MOUTH OR THROAT EVERY 6 HOURS AS NEEDED FOR  MOUTH PAIN, Disp: 300 mL, Rfl: 0 .  lisinopril (ZESTRIL) 40 MG tablet, Take 1 tablet (40 mg total) by mouth  daily., Disp: 30 tablet, Rfl: 5 .  Multiple Vitamins-Minerals (MULTIVITAMIN ADULT PO), Take 1 tablet by mouth daily., Disp: , Rfl:  .  Omega-3 Fatty Acids (FISH OIL) 1000 MG CAPS, Take 1 capsule by mouth daily. , Disp: , Rfl:  .  omeprazole (PRILOSEC) 20 MG capsule, Take 1 capsule (20 mg total) by mouth daily., Disp: 30 capsule, Rfl: 12 .  Timolol Maleate 0.5 % (DAILY) SOLN, Place 1 drop into both eyes at bedtime., Disp: , Rfl:  .  valACYclovir (VALTREX) 1000 MG tablet, TAKE 2 TABLETS BY MOUTH TWICE DAILY FOR 1 DAY AS NEEDED FOR COLD SORES, Disp: 16 tablet, Rfl: 5  Patient  Care Team: Birdie Sons, MD as PCP - General (Family Medicine) Birder Robson, MD as Referring Physician (Ophthalmology)     Objective:    Vitals: BP 129/65 (BP Location: Right Arm, Patient Position: Sitting, Cuff Size: Normal)   Pulse 80   Temp (!) 97.3 F (36.3 C) (Temporal)   Ht 5' 6.5" (1.689 m)   Wt 128 lb (58.1 kg)   BMI 20.35 kg/m   Physical Exam   General Appearance:    Alert, cooperative, no distress, appears stated age  Head:    Normocephalic, without obvious abnormality, atraumatic  Eyes:    PERRL, conjunctiva/corneas clear, EOM's intact, fundi    benign, both eyes  Ears:    Normal TM's and external ear canals, both ears  Nose:   Nares normal, septum midline, mucosa normal, no drainage    or sinus tenderness  Throat:   Lips, mucosa, and tongue normal; teeth and gums normal  Neck:   Supple, symmetrical, trachea midline, no adenopathy;    thyroid:  no enlargement/tenderness/nodules; no carotid   bruit or JVD  Back:     Symmetric, no curvature, ROM normal, no CVA tenderness  Lungs:     Clear to auscultation bilaterally, respirations unlabored  Chest Wall:    No tenderness or deformity   Heart:    Normal heart rate. Normal rhythm. No murmurs, rubs, or gallops.   Breast Exam:    normal appearance, no masses or tenderness  Abdomen:     Soft, non-tender, bowel sounds active all four quadrants,     no masses, no organomegaly  Pelvic:    deferred and not indicated; status post hysterectomy, negative ROS  Extremities:   All extremities are intact. No cyanosis or edema  Pulses:   2+ and symmetric all extremities  Skin:   Skin color, texture, turgor normal, no rashes or lesions  Lymph nodes:   Cervical, supraclavicular, and axillary nodes normal  Neurologic:   CNII-XII intact, normal strength, sensation and reflexes    throughout    Activities of Daily Living In your present state of health, do you have any difficulty performing the following activities: 06/06/2018  Hearing? N  Vision? N  Difficulty concentrating or making decisions? N  Walking or climbing stairs? N  Dressing or bathing? N  Doing errands, shopping? N  Preparing Food and eating ? N  Using the Toilet? N  In the past six months, have you accidently leaked urine? N  Do you have problems with loss of bowel control? N  Managing your Medications? N  Managing your Finances? N  Housekeeping or managing your Housekeeping? N  Some recent data might be hidden    Fall Risk Assessment Fall Risk  06/06/2018 05/31/2017 05/26/2016 11/26/2014  Falls in the past year? 1 No No No  Number falls in past yr: 0 - - -  Injury with Fall? 0 - - -  Follow up Falls prevention discussed - - -     Depression Screen PHQ 2/9 Scores 06/06/2018 05/31/2017 05/26/2016 05/26/2016  PHQ - 2 Score 0 1 0 0  PHQ- 9 Score - - 0 -    6CIT Screen 06/06/2018  What Year? 0 points  What month? 0 points  What time? 0 points  Count back from 20 0 points  Months in reverse 2 points  Repeat phrase 0 points  Total Score 2       Assessment & Plan:    Annual Physical Reviewed patient's Family Medical History Reviewed  and updated list of patient's medical providers Assessment of cognitive impairment was done Assessed patient's functional ability Established a written schedule for health screening [place] Completed and  Reviewed  Exercise Activities and Dietary recommendations Goals    . DIET - INCREASE WATER INTAKE     Recommend increasing water intake to 4-6 glasses a day.     . Exercise 3x per week (30 min per time)     Recommend exercising 4 days a week for 30 minutes. Pt plans to do this once feeling better.     [place] LIFESTYLE - DECREASE FALLS RISK     Recommend to remove any items from the home that may cause slips or trips.       Immunization History  Administered Date(s) Administered  . Influenza, High Dose Seasonal PF 11/26/2014, 09/05/2015, 10/10/2016, 10/03/2017  . Pneumococcal Conjugate-13 11/26/2014  . Pneumococcal Polysaccharide-23 09/10/2008, 12/19/2009  . Zoster 11/21/2012  . Zoster Recombinat (Shingrix) 08/22/2018    Health Maintenance  Topic Date Due  . Hepatitis C Screening  Feb 11, 1945  . FOOT EXAM  02/08/1953  . TETANUS/TDAP  02/08/1962  . HEMOGLOBIN A1C  06/02/2018  . DEXA SCAN  07/29/2018  . INFLUENZA VACCINE  08/12/2018  . OPHTHALMOLOGY EXAM  06/02/2019  . COLONOSCOPY  12/23/2021  . PNA vac Low Risk Adult  Completed     Discussed health benefits of physical activity, and encouraged her to engage in regular exercise appropriate for her age and condition.    ------------------------------------------------------------------------------------------------------------  1. Annual physical exam Generally doing well. Discussed routine health maintenance.   2. Need for hepatitis C screening test  - Hepatitis C antibody  3. Essential (primary) hypertension Much better with increased dose of lisinopril - EKG 12-Lead - Comprehensive metabolic panel - Lipid panel  4. Osteoporosis, unspecified osteoporosis type, unspecified pathological fracture presence Taking alendronate consistently every week. Anticipate BMD in 2021 - VITAMIN D 25 Hydroxy (Vit-D Deficiency, Fractures)  5. Pre-diabetes  - Hemoglobin A1c  6. Refill- lidocaine (XYLOCAINE) 2 % solution; USE AS  DIRECTED. 15 ML IN MOUTH OR THROAT EVERY 6 HOURS AS NEEDED FOR  MOUTH PAIN  Dispense: 300 mL; Refill: 5    Lelon Huh, MD  [facility]

## 2018-08-31 ENCOUNTER — Telehealth: Payer: Self-pay

## 2018-08-31 ENCOUNTER — Encounter: Payer: Self-pay | Admitting: Family Medicine

## 2018-08-31 LAB — VITAMIN D 25 HYDROXY (VIT D DEFICIENCY, FRACTURES): Vit D, 25-Hydroxy: 66 ng/mL (ref 30.0–100.0)

## 2018-08-31 LAB — LIPID PANEL
Chol/HDL Ratio: 3.6 ratio (ref 0.0–4.4)
Cholesterol, Total: 171 mg/dL (ref 100–199)
HDL: 48 mg/dL (ref >39)
LDL Calculated: 101 mg/dL — ABNORMAL HIGH (ref 0–99)
Triglycerides: 111 mg/dL (ref 0–149)
VLDL Cholesterol Cal: 22 mg/dL (ref 5–40)

## 2018-08-31 LAB — HEMOGLOBIN A1C
Est. average glucose Bld gHb Est-mCnc: 126 mg/dL
Hgb A1c MFr Bld: 6 % — ABNORMAL HIGH (ref 4.8–5.6)

## 2018-08-31 LAB — COMPREHENSIVE METABOLIC PANEL
ALT: 8 IU/L (ref 0–32)
AST: 17 IU/L (ref 0–40)
Albumin/Globulin Ratio: 1.4 (ref 1.2–2.2)
Albumin: 4.3 g/dL (ref 3.7–4.7)
Alkaline Phosphatase: 36 IU/L — ABNORMAL LOW (ref 39–117)
BUN/Creatinine Ratio: 19 (ref 12–28)
BUN: 13 mg/dL (ref 8–27)
Bilirubin Total: <0.2 mg/dL (ref 0.0–1.2)
CO2: 26 mmol/L (ref 20–29)
Calcium: 9.5 mg/dL (ref 8.7–10.3)
Chloride: 97 mmol/L (ref 96–106)
Creatinine, Ser: 0.68 mg/dL (ref 0.57–1.00)
GFR calc Af Amer: 99 mL/min/{1.73_m2} (ref >59)
GFR calc non Af Amer: 86 mL/min/{1.73_m2} (ref >59)
Globulin, Total: 3.1 g/dL (ref 1.5–4.5)
Glucose: 119 mg/dL — ABNORMAL HIGH (ref 65–99)
Potassium: 4.9 mmol/L (ref 3.5–5.2)
Sodium: 138 mmol/L (ref 134–144)
Total Protein: 7.4 g/dL (ref 6.0–8.5)

## 2018-08-31 LAB — HEPATITIS C ANTIBODY: Hep C Virus Ab: >11 s/co ratio — ABNORMAL HIGH (ref 0.0–0.9)

## 2018-08-31 NOTE — Telephone Encounter (Signed)
Called [facility] to add test.  

## 2018-08-31 NOTE — Telephone Encounter (Signed)
-----   Message from Birdie Sons, MD sent at 08/31/2018  8:40 AM EDT ----- Patient has positive hepatitis c antibody. Please have [facility] add qualitative hepatitis C RNA to see if there is still any active hepatitis C virus.

## 2018-08-31 NOTE — Telephone Encounter (Signed)
Pt returning missed call.  Please call pt back.  Thanks, [employer]

## 2018-09-03 ENCOUNTER — Other Ambulatory Visit: Payer: Self-pay | Admitting: Family Medicine

## 2018-09-03 DIAGNOSIS — B1920 Unspecified viral hepatitis C without hepatic coma: Secondary | ICD-10-CM | POA: Insufficient documentation

## 2018-09-04 ENCOUNTER — Telehealth: Payer: Self-pay

## 2018-09-04 NOTE — Telephone Encounter (Signed)
-----   Message from Birdie Sons, MD sent at 09/03/2018  3:38 PM EDT ----- Labs show she does have hepatitis c virus in her system. No sign of any liver damage from it. Have sent referral to GI to be treated with antiviral medications. Treatments are very well tolerated and nearly 100% effective.

## 2018-09-04 NOTE — Telephone Encounter (Signed)
Patient re-advised of lab results. Please schedule referral. Patient would like a referral to see GI specialist Dr. Marius Ditch at [place] after 09/25/2018.

## 2018-09-04 NOTE — Telephone Encounter (Signed)
Pt advised.   Thanks,   -Laura  

## 2018-09-04 NOTE — Telephone Encounter (Signed)
Pt needing a call back to discuss some more of her results and what is next.  Thanks, [employer]

## 2018-09-05 ENCOUNTER — Encounter: Payer: Self-pay | Admitting: Family Medicine

## 2018-09-05 ENCOUNTER — Telehealth: Payer: Self-pay

## 2018-09-05 LAB — SPECIMEN STATUS REPORT

## 2018-09-05 LAB — HCV RNA BY PCR, QN RFX GENO
HCV Quant Baseline: 680000 IU/mL
HCV log10: 5.833 log10 IU/mL

## 2018-09-05 LAB — HEPATITIS C GENOTYPE

## 2018-09-05 NOTE — Telephone Encounter (Signed)
Patient called requesting lab results. Results given to patient.

## 2018-09-05 NOTE — Telephone Encounter (Signed)
Pt called asking about about her labs.  She would like to see them in her My-Chart.  Joan Baldwin

## 2018-10-05 ENCOUNTER — Encounter: Payer: Self-pay | Admitting: Family Medicine

## 2018-10-16 ENCOUNTER — Encounter: Payer: Self-pay | Admitting: Gastroenterology

## 2018-10-16 ENCOUNTER — Other Ambulatory Visit: Payer: Self-pay

## 2018-10-16 ENCOUNTER — Ambulatory Visit: Payer: Medicare Other | Admitting: Gastroenterology

## 2018-10-16 VITALS — BP 159/81 | HR 76 | Temp 98.6°F | Ht 66.5 in | Wt 128.2 lb

## 2018-10-16 DIAGNOSIS — B182 Chronic viral hepatitis C: Secondary | ICD-10-CM | POA: Diagnosis not present

## 2018-10-16 DIAGNOSIS — K7460 Unspecified cirrhosis of liver: Secondary | ICD-10-CM | POA: Diagnosis not present

## 2018-10-16 NOTE — Progress Notes (Signed)
Joan Darby, MD [address]  [place]  [place], [place] [address]  Main: [phone]  Fax: [phone]    Gastroenterology Consultation  Referring Provider:     Birdie Sons, MD Primary Care Physician:  Birdie Sons, MD Primary Gastroenterologist:  Dr. Cephas Baldwin Reason for Consultation:     Chronic hep C        HPI:   Joan Baldwin is a 73 y.o. female referred by Dr. Birdie Sons, MD  for consultation & management of chronic hep C.  Patient has history of hypertension, osteoporosis who was routinely tested for hepatitis C and screening given her age and was found to have chronic hep C, genotype 1a.  Patient has normal LFTs and no sequelae from chronic hep C.  She does not have any signs or symptoms of cirrhosis or chronic liver disease.  Patient reports that she received blood transfusions when she was in 30s when she underwent weight loss surgery complicated by severe blood loss.  She lost about 100 pounds at that time.  She underwent reversal of gastric stapling back then.  Her weight has been stable.  She denies smoking or alcohol use.  She is otherwise active and functionally independent.  NSAIDs: None  Antiplts/Anticoagulants/Anti thrombotics: None  GI Procedures: Colonoscopy in 2018 by Dr. Alice Reichert Optimal bowel prep A 3 mm polyp in the proximal ascending colon that was removed Tubular adenoma on pathology Hemorrhoids  Past Medical History:  Diagnosis Date  . Cataract   . Glaucoma   . History of chicken pox   . Hypertension   . Osteoporosis   . Sepsis ([place]) 01/17/2016    Past Surgical History:  Procedure Laterality Date  . ABDOMINAL HYSTERECTOMY     vaginal. Still has both ovaries  . back surgeries     x 2  . COLONOSCOPY  2007   done in [place] per patient. Normal except for moderate hemorrhoids  . MOUTH SURGERY  2011  . STOMACH SURGERY     staples for obesity  . Tailbone surgery      Current Outpatient Medications:  .   acetaminophen (TYLENOL) 500 MG tablet, Take 500 mg by mouth every 8 (eight) hours as needed. , Disp: , Rfl:  .  alendronate (FOSAMAX) 70 MG tablet, TAKE 1 TABLET BY MOUTH EVERY 7 DAYS. TAKE WITH A FULL GLASS OF WATER ON EMPTY STOMACH, Disp: 12 tablet, Rfl: 4 .  amLODipine (NORVASC) 10 MG tablet, TAKE 1 TABLET BY MOUTH ONCE DAILY, Disp: 90 tablet, Rfl: 4 .  aspirin 81 MG tablet, Take 1 tablet by mouth daily., Disp: , Rfl:  .  Calcium Carbonate-Vitamin D (CALCIUM 600/VITAMIN D PO), Take 1 tablet by mouth daily. , Disp: , Rfl:  .  diazepam (VALIUM) 10 MG tablet, TAKE 1/2 TO 1 TABLET BY MOUTH 4 TIMES DAILY AS NEEDED FOR ANXIETY, Disp: 120 tablet, Rfl: 3 .  Fiber POWD, Take 2 scoop by mouth daily., Disp: , Rfl:  .  hydrochlorothiazide (HYDRODIURIL) 25 MG tablet, Take 1 tablet by mouth once daily, Disp: 90 tablet, Rfl: 4 .  latanoprost (XALATAN) 0.005 % ophthalmic solution, Place 1 drop into both eyes at bedtime., Disp: , Rfl:  .  lidocaine (XYLOCAINE) 2 % solution, USE AS DIRECTED. 15 ML IN MOUTH OR THROAT EVERY 6 HOURS AS NEEDED FOR  MOUTH PAIN, Disp: 300 mL, Rfl: 5 .  lisinopril (ZESTRIL) 40 MG tablet, Take 1 tablet (40 mg total) by  mouth daily., Disp: 30 tablet, Rfl: 5 .  Multiple Vitamins-Minerals (MULTIVITAMIN ADULT PO), Take 1 tablet by mouth daily., Disp: , Rfl:  .  Omega-3 Fatty Acids (FISH OIL) 1000 MG CAPS, Take 1 capsule by mouth daily. , Disp: , Rfl:  .  omeprazole (PRILOSEC) 20 MG capsule, Take 1 capsule (20 mg total) by mouth daily., Disp: 30 capsule, Rfl: 12 .  Timolol Maleate 0.5 % (DAILY) SOLN, Place 1 drop into both eyes at bedtime., Disp: , Rfl:  .  valACYclovir (VALTREX) 1000 MG tablet, TAKE 2 TABLETS BY MOUTH TWICE DAILY FOR 1 DAY AS NEEDED FOR COLD SORES, Disp: 16 tablet, Rfl: 5   Family History  Problem Relation Age of Onset  . Emphysema Mother   . Kidney failure Sister   . Heart Problems Brother   . Diabetes Brother   . Skin cancer Brother   . Breast cancer Neg Hx       Social History   Tobacco Use  . Smoking status: Former Smoker    Packs/day: 1.00    Years: 30.00    Pack years: 30.00    Types: Cigarettes    Quit date: 01/11/2001    Years since quitting: 17.7  . Smokeless tobacco: Never Used  Substance Use Topics  . Alcohol use: No    Alcohol/week: 0.0 standard drinks  . Drug use: No    Allergies as of 10/16/2018 - Review Complete 10/16/2018  Allergen Reaction Noted  . Gabapentin Rash 10/03/2017  . Codeine Itching 11/22/2014  . Tramadol hcl Other (See Comments) 11/22/2014  . Ultram  [tramadol hcl]  11/22/2014    Review of Systems:    All systems reviewed and negative except where noted in HPI.   Physical Exam:  BP (!) 159/81 (BP Location: Left Arm, Patient Position: Sitting, Cuff Size: Normal)   Pulse 76   Temp 98.6 F (37 C) (Oral)   Ht 5' 6.5" (1.689 m)   Wt 128 lb 4 oz (58.2 kg)   BMI 20.39 kg/m  No LMP recorded. Patient has had a hysterectomy.  General:   Alert,  Well-developed, well-nourished, pleasant and cooperative in NAD Head:  Normocephalic and atraumatic. Eyes:  Sclera clear, no icterus.   Conjunctiva pink. Ears:  Normal auditory acuity. Nose:  No deformity, discharge, or lesions. Mouth:  No deformity or lesions,oropharynx pink & moist. Neck:  Supple; no masses or thyromegaly. Lungs:  Respirations even and unlabored.  Clear throughout to auscultation.   No wheezes, crackles, or rhonchi. No acute distress. Heart:  Regular rate and rhythm; no murmurs, clicks, rubs, or gallops. Abdomen:  Normal bowel sounds. Soft, non-tender and non-distended without masses, hepatosplenomegaly or hernias noted.  No guarding or rebound tenderness.   Rectal: Not performed Msk:  Symmetrical without gross deformities. Good, equal movement & strength bilaterally. Pulses:  Normal pulses noted. Extremities:  No clubbing or edema.  No cyanosis. Neurologic:  Alert and oriented x3;  grossly normal neurologically. Skin:  Intact without significant  lesions or rashes. No jaundice. Lymph Nodes:  No significant cervical adenopathy. Psych:  Alert and cooperative. Normal mood and affect.  Imaging Studies: No abdominal imaging  Assessment and Plan:   Joan Baldwin is a 73 y.o. female with history of hypertension, osteoporosis on Fosamax, chronic GERD on intermittent PPI seen in consultation for chronic hep C, genotype 1a, VL 680 K  Chronic hep C, normal LFTs There is no evidence of chronic liver disease Check hepatitis A and B serologies, vaccinate if  not immune HCV fibrosure Patient will be a good candidate for chronic hep C treatment pending above work-up Patient will be contacted after the results for treatment of hep C  Follow up as needed   Joan Darby, MD

## 2018-10-18 ENCOUNTER — Telehealth: Payer: Self-pay | Admitting: Gastroenterology

## 2018-10-18 ENCOUNTER — Encounter: Payer: Self-pay | Admitting: Gastroenterology

## 2018-10-18 ENCOUNTER — Encounter: Payer: Self-pay | Admitting: Family Medicine

## 2018-10-18 ENCOUNTER — Telehealth: Payer: Self-pay | Admitting: Family Medicine

## 2018-10-18 NOTE — Telephone Encounter (Signed)
Patient had appointment on 10/16/2018 and had lab work done. Patient wants to know the results of the lab work

## 2018-10-18 NOTE — Telephone Encounter (Signed)
Pt calling per Dr. Marius Ditch to let Dr. Caryn Section know she is immune to all Hep's except for 1.  Not sure which one but needing a call back at [phone] to discuss getting the injection asap.  Thanks, [employer]

## 2018-10-18 NOTE — Telephone Encounter (Signed)
Patient called requesting results from her lab work  Drawn on Monday . She Has requested a call either way.

## 2018-10-18 NOTE — Telephone Encounter (Signed)
She is immune to hepatitis A.  She needs hepatitis B vaccine which can be administered at our office. Otherwise, we are waiting on HCV FibroSure results.  Ginger has paperwork ready for hep C treatment once the fibrosure results are back.  She will contact her as soon as possible  Thanks RV

## 2018-10-18 NOTE — Telephone Encounter (Signed)
Patient verbalized understanding  

## 2018-10-19 ENCOUNTER — Telehealth: Payer: Self-pay | Admitting: Gastroenterology

## 2018-10-19 ENCOUNTER — Telehealth: Payer: Self-pay | Admitting: Family Medicine

## 2018-10-19 LAB — HEPATITIS B SURFACE ANTIBODY,QUALITATIVE: Hep B Surface Ab, Qual: NONREACTIVE

## 2018-10-19 LAB — HCV FIBROSURE
ALPHA 2-MACROGLOBULINS, QN: 398 mg/dL — ABNORMAL HIGH (ref 110–276)
ALT (SGPT) P5P: 12 IU/L (ref 0–40)
Apolipoprotein A-1: 130 mg/dL (ref 116–209)
Bilirubin, Total: 0.2 mg/dL (ref 0.0–1.2)
Fibrosis Score: 0.5 — ABNORMAL HIGH (ref 0.00–0.21)
GGT: 54 IU/L (ref 0–60)
Haptoglobin: 205 mg/dL (ref 42–346)
Necroinflammat Activity Score: 0.05 (ref 0.00–0.17)

## 2018-10-19 LAB — HEPATITIS B SURFACE ANTIGEN: Hepatitis B Surface Ag: NEGATIVE

## 2018-10-19 LAB — HEPATITIS A ANTIBODY, TOTAL: hep A Total Ab: POSITIVE — AB

## 2018-10-19 LAB — HEPATITIS B CORE ANTIBODY, TOTAL: Hep B Core Total Ab: NEGATIVE

## 2018-10-19 NOTE — Telephone Encounter (Signed)
Please review patient HCV results

## 2018-10-19 NOTE — Telephone Encounter (Signed)
Pt is calling to check on Lab results please call pt

## 2018-10-19 NOTE — Telephone Encounter (Signed)
Per dr Marius Ditch patient needs hepatitis B series since she already has hepatitis C.  Please call patient and schedule time to come in start hep B vaccine series.

## 2018-10-19 NOTE — Telephone Encounter (Signed)
Appointment scheduled 10/23/2018 at 9:40am.

## 2018-10-20 ENCOUNTER — Other Ambulatory Visit: Payer: Self-pay

## 2018-10-20 DIAGNOSIS — B182 Chronic viral hepatitis C: Secondary | ICD-10-CM

## 2018-10-23 ENCOUNTER — Other Ambulatory Visit: Payer: Self-pay

## 2018-10-23 ENCOUNTER — Ambulatory Visit (INDEPENDENT_AMBULATORY_CARE_PROVIDER_SITE_OTHER): Payer: Medicare Other | Admitting: Family Medicine

## 2018-10-23 ENCOUNTER — Encounter: Payer: Self-pay | Admitting: Gastroenterology

## 2018-10-23 DIAGNOSIS — B1920 Unspecified viral hepatitis C without hepatic coma: Secondary | ICD-10-CM

## 2018-10-23 DIAGNOSIS — Z23 Encounter for immunization: Secondary | ICD-10-CM | POA: Diagnosis not present

## 2018-10-23 NOTE — Progress Notes (Signed)
Nurse visit only: administered Hepatitis B vaccine.

## 2018-10-23 NOTE — Patient Instructions (Signed)
.   Please review the attached list of medications and notify my office if there are any errors.   . Please bring all of your medications to every appointment so we can make sure that our medication list is the same as yours.   . It is especially important to get the annual flu vaccine this year. If you haven't had it already, please go to your pharmacy or call the office as soon as possible to schedule you flu shot.  

## 2018-10-26 ENCOUNTER — Encounter: Payer: Self-pay | Admitting: Gastroenterology

## 2018-10-26 ENCOUNTER — Telehealth: Payer: Self-pay | Admitting: Gastroenterology

## 2018-10-26 ENCOUNTER — Ambulatory Visit
Admission: RE | Admit: 2018-10-26 | Discharge: 2018-10-26 | Disposition: A | Discharge status: Home / Self Care | Payer: Medicare Other | Source: Ambulatory Visit | Attending: Gastroenterology | Admitting: Gastroenterology

## 2018-10-26 ENCOUNTER — Other Ambulatory Visit: Payer: Self-pay

## 2018-10-26 DIAGNOSIS — B182 Chronic viral hepatitis C: Secondary | ICD-10-CM | POA: Insufficient documentation

## 2018-10-26 DIAGNOSIS — Z8619 Personal history of other infectious and parasitic diseases: Secondary | ICD-10-CM | POA: Diagnosis not present

## 2018-10-26 NOTE — Telephone Encounter (Signed)
Joan Baldwin with admitting from [facility] called & would like to know when the patient needs to have the labs work in [employer]. We can call patient to give the information. Patient was at the hospital having a study but did not want to wait.

## 2018-10-27 ENCOUNTER — Encounter: Payer: Self-pay | Admitting: Family Medicine

## 2018-10-27 ENCOUNTER — Other Ambulatory Visit: Payer: Self-pay

## 2018-10-27 DIAGNOSIS — B182 Chronic viral hepatitis C: Secondary | ICD-10-CM

## 2018-10-27 NOTE — Telephone Encounter (Signed)
Spoke with pt and she will complete labs on one day next week

## 2018-10-31 ENCOUNTER — Other Ambulatory Visit: Payer: Self-pay

## 2018-10-31 ENCOUNTER — Telehealth: Payer: Self-pay | Admitting: Gastroenterology

## 2018-10-31 NOTE — Telephone Encounter (Signed)
Pt left vm to receive her scan results

## 2018-11-06 NOTE — Telephone Encounter (Signed)
Pt has been notified of results and verbalized understanding  

## 2018-11-11 ENCOUNTER — Other Ambulatory Visit: Payer: Self-pay | Admitting: Family Medicine

## 2018-11-11 DIAGNOSIS — M810 Age-related osteoporosis without current pathological fracture: Secondary | ICD-10-CM

## 2018-11-13 ENCOUNTER — Encounter: Payer: Self-pay | Admitting: Gastroenterology

## 2018-11-29 ENCOUNTER — Other Ambulatory Visit: Payer: Self-pay | Admitting: Family Medicine

## 2018-11-29 DIAGNOSIS — I10 Essential (primary) hypertension: Secondary | ICD-10-CM

## 2018-12-11 ENCOUNTER — Other Ambulatory Visit: Payer: Self-pay | Admitting: Family Medicine

## 2018-12-11 DIAGNOSIS — G8929 Other chronic pain: Secondary | ICD-10-CM

## 2018-12-11 DIAGNOSIS — K089 Disorder of teeth and supporting structures, unspecified: Secondary | ICD-10-CM

## 2018-12-11 DIAGNOSIS — F419 Anxiety disorder, unspecified: Secondary | ICD-10-CM

## 2018-12-11 NOTE — Telephone Encounter (Signed)
Requested medication (s) are due for refill today: yes  Requested medication (s) are on the active medication list: yes  Last refill: 11/09/2018  Future visit scheduled:yes  Notes to clinic:  Review medication for refills   Requested Prescriptions  Pending Prescriptions Disp Refills   diazepam (VALIUM) 10 MG tablet [Pharmacy Med Name: diazePAM 10 MG Oral Tablet] 120 tablet 0    Sig: TAKE 1/2 TO 1 (ONE-HALF TO ONE) TABLET BY MOUTH 4 TIMES DAILY AS NEEDED FOR ANXIETY     Not Delegated - Psychiatry:  Anxiolytics/Hypnotics Failed - 12/11/2018  9:09 AM      Failed - This refill cannot be delegated      Failed - Urine Drug Screen completed in last 360 days.      Passed - Valid encounter within last 6 months    Recent Outpatient Visits          1 month ago Hepatitis C virus infection without hepatic coma, unspecified chronicity   [facility] Fisher, Kirstie Peri, MD   3 months ago Annual physical exam   [facility] Birdie Sons, MD   7 months ago Essential (primary) hypertension   [facility] Birdie Sons, MD   1 year ago [place], Donald E, MD   1 year ago Essential (primary) hypertension   [place], Kirstie Peri, MD      Future Appointments            In 2 months Fisher, Kirstie Peri, MD [facility], [facility]            lidocaine (XYLOCAINE) 2 % solution [Pharmacy Med Name: Lidocaine Viscous HCl 2 % Mouth/Throat Solution] 300 mL 0    Sig: USE AS DIRECTED. 15ML IN MOUTH OR THROAT EVERY 6 HOURS AS NEEDED FOR MOUTH PAIN     Off-Protocol Failed - 12/11/2018  9:09 AM      Failed - Medication not assigned to a protocol, review manually.      Passed - Valid encounter within last 12 months    Recent Outpatient Visits          1 month ago Hepatitis C virus infection without hepatic coma, unspecified chronicity   [facility] Fisher, Kirstie Peri, MD    3 months ago Annual physical exam   [facility] Birdie Sons, MD   7 months ago Essential (primary) hypertension   [facility] Birdie Sons, MD   1 year ago Pre-diabetes   [facility] Birdie Sons, MD   1 year ago Essential (primary) hypertension   [place], Kirstie Peri, MD      Future Appointments            In 2 months Fisher, Kirstie Peri, MD [facility], [place]

## 2018-12-25 ENCOUNTER — Other Ambulatory Visit: Payer: Self-pay

## 2018-12-25 ENCOUNTER — Ambulatory Visit (INDEPENDENT_AMBULATORY_CARE_PROVIDER_SITE_OTHER): Payer: Medicare Other | Admitting: Family Medicine

## 2018-12-25 DIAGNOSIS — B1920 Unspecified viral hepatitis C without hepatic coma: Secondary | ICD-10-CM

## 2018-12-25 DIAGNOSIS — Z23 Encounter for immunization: Secondary | ICD-10-CM | POA: Diagnosis not present

## 2018-12-25 NOTE — Progress Notes (Signed)
Nurse visit only

## 2018-12-25 NOTE — Patient Instructions (Signed)
.   Please review the attached list of medications and notify my office if there are any errors.   . Please bring all of your medications to every appointment so we can make sure that our medication list is the same as yours.   . It is especially important to get the annual flu vaccine this year. If you haven't had it already, please go to your pharmacy or call the office as soon as possible to schedule you flu shot.  

## 2019-01-23 ENCOUNTER — Other Ambulatory Visit: Payer: Self-pay | Admitting: Family Medicine

## 2019-01-23 NOTE — Telephone Encounter (Signed)
Pt following up on refill request for  omeprazole (PRILOSEC) 20 MG capsule  She said the pharmacy told her it would be faster if she called too.   [place], [place] Phone:  [phone]  Fax:  [phone]

## 2019-02-13 ENCOUNTER — Other Ambulatory Visit: Payer: Self-pay | Admitting: Family Medicine

## 2019-02-13 DIAGNOSIS — I10 Essential (primary) hypertension: Secondary | ICD-10-CM

## 2019-02-23 ENCOUNTER — Ambulatory Visit (INDEPENDENT_AMBULATORY_CARE_PROVIDER_SITE_OTHER): Payer: Medicare Other | Admitting: Family Medicine

## 2019-02-23 ENCOUNTER — Other Ambulatory Visit: Payer: Self-pay

## 2019-02-23 ENCOUNTER — Encounter: Payer: Self-pay | Admitting: Family Medicine

## 2019-02-23 VITALS — BP 130/62 | HR 65 | Temp 97.5°F | Resp 16 | Wt 128.0 lb

## 2019-02-23 DIAGNOSIS — R7303 Prediabetes: Secondary | ICD-10-CM | POA: Diagnosis not present

## 2019-02-23 DIAGNOSIS — I10 Essential (primary) hypertension: Secondary | ICD-10-CM | POA: Diagnosis not present

## 2019-02-23 LAB — POCT GLYCOSYLATED HEMOGLOBIN (HGB A1C)
Est. average glucose Bld gHb Est-mCnc: 123
Hemoglobin A1C: 5.9 % — AB (ref 4.0–5.6)

## 2019-02-23 MED ORDER — AMLODIPINE BESYLATE 10 MG PO TABS: 10.0000 mg | ORAL_TABLET | Freq: Every evening | ORAL | 0 refills | Status: DC

## 2019-02-23 NOTE — Patient Instructions (Signed)
.   Please review the attached list of medications and notify my office if there are any errors.   . Please bring all of your medications to every appointment so we can make sure that our medication list is the same as yours.   

## 2019-02-23 NOTE — Progress Notes (Signed)
Patient: Joan Baldwin Female    DOB: March 18, 1945   74 y.o.   MRN: [id number] Visit Date: 02/23/2019  Today's Provider: Lelon Huh, MD   Chief Complaint  Patient presents with  . Hypertension  . Hyperglycemia   Subjective:     HPI  Hypertension, follow-up:  BP Readings from Last 3 Encounters:  02/23/19 130/62  10/16/18 (!) 159/81  08/30/18 129/65    She was last seen for hypertension 6 months ago.  BP at that visit was 129/65. Management since that visit includes no changes. She reports good compliance with treatment. She is not having side effects.  She is not exercising. She is adherent to low salt diet.   Outside blood pressures are 123456 systolic.Patient is unsure of the diastolic reading. She is experiencing none.  Patient denies chest pain, chest pressure/discomfort, claudication, dyspnea, exertional chest pressure/discomfort, fatigue, irregular heart beat, lower extremity edema, near-syncope, orthopnea, palpitations, paroxysmal nocturnal dyspnea, syncope and tachypnea.   Cardiovascular risk factors include advanced age (older than 65 for men, 59 for women) and hypertension.  Use of agents associated with hypertension: NSAIDS.     Weight trend: stable Wt Readings from Last 3 Encounters:  02/23/19 128 lb (58.1 kg)  10/16/18 128 lb 4 oz (58.2 kg)  08/30/18 128 lb (58.1 kg)    Current diet: well balanced  ------------------------------------------------------------------------  Prediabetes, Follow-up:   Lab Results  Component Value Date   HGBA1C 6.0 (H) 08/30/2018   HGBA1C 5.7 (A) 12/02/2017   HGBA1C 5.7 (H) 05/31/2017   GLUCOSE 119 (H) 08/30/2018   GLUCOSE 121 (H) 05/03/2018   GLUCOSE 99 12/02/2017    Last seen for for this6 months ago.  Management since that visit includes no changes. Current symptoms include none and have been stable.  Weight trend: stable Prior visit with dietician: no Current diet: well balanced Current  exercise: none  Pertinent Labs:    Component Value Date/Time   CHOL 171 08/30/2018 1100   TRIG 111 08/30/2018 1100   CHOLHDL 3.6 08/30/2018 1100   CREATININE 0.68 08/30/2018 1100    Wt Readings from Last 3 Encounters:  02/23/19 128 lb (58.1 kg)  10/16/18 128 lb 4 oz (58.2 kg)  08/30/18 128 lb (58.1 kg)    Allergies  Allergen Reactions  . Gabapentin Rash  . Codeine Itching  . Tramadol Hcl Other (See Comments)    Insomnia  . Ultram  [Tramadol Hcl]     Insomnia     Current Outpatient Medications:  .  acetaminophen (TYLENOL) 500 MG tablet, Take 500 mg by mouth every 8 (eight) hours as needed. , Disp: , Rfl:  .  alendronate (FOSAMAX) 70 MG tablet, TAKE 1 TABLET BY MOUTH EVERY 7 DAYS. TAKE WITH A FULL GLASS OF WATER ON EMPTY STOMACH, Disp: 12 tablet, Rfl: 12 .  amLODipine (NORVASC) 10 MG tablet, Take 1 tablet by mouth once daily, Disp: 90 tablet, Rfl: 3 .  aspirin 81 MG tablet, Take 1 tablet by mouth daily., Disp: , Rfl:  .  Calcium Carbonate-Vitamin D (CALCIUM 600/VITAMIN D PO), Take 1 tablet by mouth daily. , Disp: , Rfl:  .  diazepam (VALIUM) 10 MG tablet, TAKE 1/2 TO 1 (ONE-HALF TO ONE) TABLET BY MOUTH 4 TIMES DAILY AS NEEDED FOR ANXIETY, Disp: 120 tablet, Rfl: 3 .  Fiber POWD, Take 2 scoop by mouth daily., Disp: , Rfl:  .  hydrochlorothiazide (HYDRODIURIL) 25 MG tablet, Take 1 tablet by mouth once daily, Disp:  90 tablet, Rfl: 4 .  latanoprost (XALATAN) 0.005 % ophthalmic solution, Place 1 drop into both eyes at bedtime., Disp: , Rfl:  .  lidocaine (XYLOCAINE) 2 % solution, USE AS DIRECTED. 15ML IN MOUTH OR THROAT EVERY 6 HOURS AS NEEDED FOR MOUTH PAIN, Disp: 300 mL, Rfl: 6 .  lisinopril (ZESTRIL) 40 MG tablet, Take 1 tablet by mouth once daily, Disp: 90 tablet, Rfl: 0 .  Multiple Vitamins-Minerals (MULTIVITAMIN ADULT PO), Take 1 tablet by mouth daily., Disp: , Rfl:  .  Omega-3 Fatty Acids (FISH OIL) 1000 MG CAPS, Take 1 capsule by mouth daily. , Disp: , Rfl:  .  omeprazole  (PRILOSEC) 20 MG capsule, Take 1 capsule by mouth once daily, Disp: 90 capsule, Rfl: 4 .  Timolol Maleate 0.5 % (DAILY) SOLN, Place 1 drop into both eyes at bedtime., Disp: , Rfl:  .  valACYclovir (VALTREX) 1000 MG tablet, TAKE 2 TABLETS BY MOUTH TWICE DAILY FOR 1 DAY AS NEEDED FOR COLD SORES, Disp: 16 tablet, Rfl: 5  Review of Systems  Constitutional: Negative for appetite change, chills, fatigue and fever.  Respiratory: Negative for chest tightness and shortness of breath.   Cardiovascular: Negative for chest pain and palpitations.  Gastrointestinal: Negative for abdominal pain, nausea and vomiting.  Neurological: Negative for dizziness and weakness.    Social History   Tobacco Use  . Smoking status: Former Smoker    Packs/day: 1.00    Years: 30.00    Pack years: 30.00    Types: Cigarettes    Quit date: 01/11/2001    Years since quitting: 18.1  . Smokeless tobacco: Never Used  Substance Use Topics  . Alcohol use: No    Alcohol/week: 0.0 standard drinks      Objective:   BP 130/62 (BP Location: Left Arm, Patient Position: Sitting, Cuff Size: Normal)   Pulse 65   Temp (!) 97.5 F (36.4 C) (Temporal)   Resp 16   Wt 128 lb (58.1 kg)   SpO2 98% Comment: room air  BMI 20.35 kg/m  Vitals:   02/23/19 1350  BP: 130/62  Pulse: 65  Resp: 16  Temp: (!) 97.5 F (36.4 C)  TempSrc: Temporal  SpO2: 98%  Weight: 128 lb (58.1 kg)  Body mass index is 20.35 kg/m.   Physical Exam   General: Appearance:    Well developed, well nourished female in no acute distress  Eyes:    PERRL, conjunctiva/corneas clear, EOM's intact       Lungs:     Clear to auscultation bilaterally, respirations unlabored  Heart:    Normal heart rate. Normal rhythm. No murmurs, rubs, or gallops.   MS:   All extremities are intact.   Neurologic:   Awake, alert, oriented x 3. No apparent focal neurological           defect.       Results for orders placed or performed in visit on 02/23/19  POCT HgB A1C    Result Value Ref Range   Hemoglobin A1C 5.9 (A) 4.0 - 5.6 %   Est. average glucose Bld gHb Est-mCnc 123        Assessment & Plan    1. Pre-diabetes Stable.   2. Essential (primary) hypertension Well controlled.  Continue current medications.  She does occasionally have some elevated systolic BP readings at home likely related to stress. Recommend she take amlodipine in the evening.  - amLODipine (NORVASC) 10 MG tablet; Take 1 tablet (10 mg total) by mouth  every evening.  Dispense: 3 tablet; Refill: 0  The entirety of the information documented in the History of Present Illness, Review of Systems and Physical Exam were personally obtained by me. Portions of this information were initially documented by Meyer Cory, CMA and reviewed by me for thoroughness and accuracy.      Lelon Huh, MD  [facility]

## 2019-02-27 ENCOUNTER — Other Ambulatory Visit: Payer: Self-pay | Admitting: Family Medicine

## 2019-02-27 DIAGNOSIS — I10 Essential (primary) hypertension: Secondary | ICD-10-CM

## 2019-02-28 ENCOUNTER — Encounter: Payer: Self-pay | Admitting: Family Medicine

## 2019-02-28 ENCOUNTER — Telehealth: Payer: Self-pay

## 2019-02-28 NOTE — Telephone Encounter (Signed)
Copied from [place] [id number]. Topic: General - Other >> Feb 28, 2019  9:00 AM Leward Quan A wrote: Reason for CRM: Patient called to inform Dr Caryn Section that her husband passed away on the night of 03/02/19 at the hospital. She can be reached at Ph#  [phone]

## 2019-03-07 DIAGNOSIS — H401132 Primary open-angle glaucoma, bilateral, moderate stage: Secondary | ICD-10-CM | POA: Diagnosis not present

## 2019-03-26 ENCOUNTER — Other Ambulatory Visit: Payer: Self-pay | Admitting: Family Medicine

## 2019-03-26 DIAGNOSIS — I10 Essential (primary) hypertension: Secondary | ICD-10-CM

## 2019-03-29 ENCOUNTER — Other Ambulatory Visit: Payer: Self-pay | Admitting: Family Medicine

## 2019-03-29 ENCOUNTER — Telehealth: Payer: Self-pay | Admitting: Family Medicine

## 2019-03-29 DIAGNOSIS — G8929 Other chronic pain: Secondary | ICD-10-CM

## 2019-03-29 NOTE — Telephone Encounter (Signed)
Requested medication (s) are due for refill today - written 12/11/18 6 RF- depends on usage  Requested medication (s) are on the active medication list -yes  Future visit scheduled -yes  Last refill: 03/14/19  Notes to clinic: Patient requesting RF on medication not assigned to protocol. Sent for PCP review  Requested Prescriptions  Pending Prescriptions Disp Refills   lidocaine (XYLOCAINE) 2 % solution [Pharmacy Med Name: Lidocaine Viscous HCl 2 % Mouth/Throat Solution] 300 mL 0    Sig: USE AS DIRECTED. 15ML IN MOUTH OR THROAT EVERY 6 HOURS AS NEEDED FOR MOUTH PAIN      Off-Protocol Failed - 03/29/2019 11:19 AM      Failed - Medication not assigned to a protocol, review manually.      Passed - Valid encounter within last 12 months    Recent Outpatient Visits           1 month ago Pre-diabetes   [facility] Birdie Sons, MD   3 months ago Hepatitis C virus infection without hepatic coma, unspecified chronicity   [facility] Birdie Sons, MD   5 months ago Hepatitis C virus infection without hepatic coma, unspecified chronicity   [facility] Birdie Sons, MD   7 months ago Annual physical exam   [facility] Birdie Sons, MD   11 months ago Essential (primary) hypertension   [place], MD       Future Appointments             In 4 months Fisher, Kirstie Peri, MD [facility], [facility]                Requested Prescriptions  Pending Prescriptions Disp Refills   lidocaine (XYLOCAINE) 2 % solution [Pharmacy Med Name: Lidocaine Viscous HCl 2 % Mouth/Throat Solution] 300 mL 0    Sig: USE AS DIRECTED. 15ML IN MOUTH OR THROAT EVERY 6 HOURS AS NEEDED FOR MOUTH PAIN      Off-Protocol Failed - 03/29/2019 11:19 AM      Failed - Medication not assigned to a protocol, review manually.      Passed - Valid encounter within last 12 months    Recent Outpatient Visits            1 month ago Pre-diabetes   [facility] Birdie Sons, MD   3 months ago Hepatitis C virus infection without hepatic coma, unspecified chronicity   [facility] Birdie Sons, MD   5 months ago Hepatitis C virus infection without hepatic coma, unspecified chronicity   [facility] Birdie Sons, MD   7 months ago Annual physical exam   [facility] Birdie Sons, MD   11 months ago Essential (primary) hypertension   [place], Kirstie Peri, MD       Future Appointments             In 4 months Fisher, Kirstie Peri, MD [facility] [place]

## 2019-03-29 NOTE — Telephone Encounter (Signed)
Contacted patient to advise she should get the COVID vaccine now and the Hep vaccine can be given at her appointment in August. Patient said "nevermind don't change it my husband passed away and I'm about to go crazy, I don't want to change anything at this time"

## 2019-03-29 NOTE — Telephone Encounter (Signed)
Patient is calling to ask she has one more Heptis shot in April. Her next appt with Caryn Section is in August. Should she get the COVID vaccine? Please advise Cb- [phone]

## 2019-04-04 ENCOUNTER — Other Ambulatory Visit: Payer: Self-pay | Admitting: Family Medicine

## 2019-04-04 DIAGNOSIS — F419 Anxiety disorder, unspecified: Secondary | ICD-10-CM

## 2019-04-04 NOTE — Telephone Encounter (Signed)
LOV  02/23/19  LRF  12/11/18  # 120 x 3

## 2019-04-04 NOTE — Telephone Encounter (Signed)
Requested medications are due for refill today?  Yes  - medication cannot be delegated  Requested medications are on active medication list?  Yes  Last Refill:  12/11/2018   # 120 with 3 refills  Future visit scheduled?  Yes  Notes to Clinic:  This medication cannot be delegated.

## 2019-04-14 ENCOUNTER — Other Ambulatory Visit: Payer: Self-pay | Admitting: Family Medicine

## 2019-04-14 ENCOUNTER — Encounter: Payer: Self-pay | Admitting: Family Medicine

## 2019-04-14 DIAGNOSIS — G8929 Other chronic pain: Secondary | ICD-10-CM

## 2019-04-14 DIAGNOSIS — K089 Disorder of teeth and supporting structures, unspecified: Secondary | ICD-10-CM

## 2019-04-16 ENCOUNTER — Telehealth: Payer: Self-pay

## 2019-04-16 NOTE — Telephone Encounter (Signed)
Attempted to contact patient, to advise her to wait 1 month after receiving COVID vaccine on 4/6 before getting Hep B vaccine. She answered then phone hung up. Tried to call back the kline was busy.Faythe Ghee for [facility] to advise patient.

## 2019-04-16 NOTE — Telephone Encounter (Signed)
Pt given advice per Dr Caryn Section;  pt reminded that she has appt 05/22/19 at 1340 for 3rd Hepatitis vaccine; she verbalized understanding.

## 2019-04-16 NOTE — Telephone Encounter (Signed)
Patient would like a phone call rather then a my chart message regarding message below, best # [phone] (patient states she would like a call prior to 12noon)

## 2019-04-17 ENCOUNTER — Other Ambulatory Visit: Payer: Self-pay | Admitting: Family Medicine

## 2019-04-17 DIAGNOSIS — G8929 Other chronic pain: Secondary | ICD-10-CM

## 2019-04-17 DIAGNOSIS — K089 Disorder of teeth and supporting structures, unspecified: Secondary | ICD-10-CM

## 2019-04-17 NOTE — Telephone Encounter (Signed)
Requested medication (s) are due for refill today: yes   Requested medication (s) are on the active medication list: yes  Last refill:  04/14/19 320mls  0 refills  Future visit scheduled:yes  Notes to clinic:  Not delegated.  Patient just received 04/14/19    Requested Prescriptions  Pending Prescriptions Disp Refills   lidocaine (XYLOCAINE) 2 % solution [Pharmacy Med Name: Lidocaine Viscous HCl 2 % Mouth/Throat Solution] 300 mL 0    Sig: USE AS DIRECTED. 15ML IN MOUTH OR THROAT EVERY 6 HOURS AS NEEDED FOR MOUTH PAIN      Off-Protocol Failed - 04/17/2019 12:13 PM      Failed - Medication not assigned to a protocol, review manually.      Passed - Valid encounter within last 12 months    Recent Outpatient Visits           1 month ago Pre-diabetes   [facility] Birdie Sons, MD   3 months ago Hepatitis C virus infection without hepatic coma, unspecified chronicity   [facility] Birdie Sons, MD   5 months ago Hepatitis C virus infection without hepatic coma, unspecified chronicity   [facility] Birdie Sons, MD   7 months ago Annual physical exam   [facility] Birdie Sons, MD   11 months ago Essential (primary) hypertension   [place], Kirstie Peri, MD       Future Appointments             In 4 months Fisher, Kirstie Peri, MD [facility], [place]

## 2019-04-27 ENCOUNTER — Ambulatory Visit: Payer: Self-pay | Admitting: Family Medicine

## 2019-05-14 ENCOUNTER — Telehealth: Payer: Self-pay | Admitting: Family Medicine

## 2019-05-14 NOTE — Telephone Encounter (Signed)
Left message for patient to schedule Annual Wellness Visit. .Please schedule with Nurse Health Advisor McKenzie Markoski, RN. ° °

## 2019-05-15 ENCOUNTER — Ambulatory Visit: Payer: Medicare Other | Admitting: Family Medicine

## 2019-05-15 ENCOUNTER — Other Ambulatory Visit: Payer: Self-pay

## 2019-05-22 ENCOUNTER — Ambulatory Visit: Payer: Self-pay | Admitting: Family Medicine

## 2019-05-31 NOTE — Progress Notes (Signed)
Established patient visit   Patient: Joan Baldwin   DOB: [birth year]   74 y.o. Female  MRN: [id number] Visit Date: 06/01/2019  Today's healthcare provider: Lelon Huh, MD   Chief Complaint  Patient presents with  . Grief  . Memory loss  . Loss of concentration   I,Latasha Walston,acting as a scribe for Lelon Huh, MD.,have documented all relevant documentation on the behalf of Lelon Huh, MD,as directed by  Lelon Huh, MD while in the presence of Lelon Huh, MD.  Subjective    HPI Patient presents today C/O loss of concentration, memory loss and some grief from loss of husband in February 2021. She states the symptoms started when she loss her husband. Symptoms are getting worse. Patient is not seeing a counselor or therapist.      Medications: Outpatient Medications Prior to Visit  Medication Sig  . acetaminophen (TYLENOL) 500 MG tablet Take 500 mg by mouth every 8 (eight) hours as needed.   Marland Kitchen alendronate (FOSAMAX) 70 MG tablet TAKE 1 TABLET BY MOUTH EVERY 7 DAYS. TAKE WITH A FULL GLASS OF WATER ON EMPTY STOMACH  . amLODipine (NORVASC) 10 MG tablet Take 1 tablet (10 mg total) by mouth every evening.  Marland Kitchen aspirin 81 MG tablet Take 1 tablet by mouth daily.  . Calcium Carbonate-Vitamin D (CALCIUM 600/VITAMIN D PO) Take 1 tablet by mouth daily.   . diazepam (VALIUM) 10 MG tablet TAKE 1/2 (ONE-HALF) TO 1 TABLET BY MOUTH 4 TIMES DAILY AS NEEDED FOR ANXIETY  . Fiber POWD Take 2 scoop by mouth daily.  . hydrochlorothiazide (HYDRODIURIL) 25 MG tablet Take 1 tablet by mouth once daily  . latanoprost (XALATAN) 0.005 % ophthalmic solution Place 1 drop into both eyes at bedtime.  . lidocaine (XYLOCAINE) 2 % solution USE AS DIRECTED. 15ML IN MOUTH OR THROAT EVERY 6 HOURS AS NEEDED FOR MOUTH PAIN  . lisinopril (ZESTRIL) 40 MG tablet Take 1 tablet by mouth once daily  . Multiple Vitamins-Minerals (MULTIVITAMIN ADULT PO) Take 1 tablet by mouth daily.  . Omega-3 Fatty  Acids (FISH OIL) 1000 MG CAPS Take 1 capsule by mouth daily.   Marland Kitchen omeprazole (PRILOSEC) 20 MG capsule Take 1 capsule by mouth once daily  . Timolol Maleate 0.5 % (DAILY) SOLN Place 1 drop into both eyes at bedtime.  . valACYclovir (VALTREX) 1000 MG tablet TAKE 2 TABLETS BY MOUTH TWICE DAILY FOR 1 DAY AS NEEDED FOR COLD SORES   No facility-administered medications prior to visit.    Review of Systems  Constitutional: Positive for appetite change.  Respiratory: Negative.   Cardiovascular: Negative.   Musculoskeletal: Positive for gait problem.  Neurological:       Memory loss   Psychiatric/Behavioral: Positive for confusion.       Grief        Objective    BP 119/64 (BP Location: Right Arm, Patient Position: Sitting, Cuff Size: Normal)   Pulse 81   Temp (!) 97.3 F (36.3 C) (Temporal)   Wt 126 lb 3.2 oz (57.2 kg)   BMI 20.06 kg/m    Physical Exam   General: Appearance:    Well developed, well nourished female in no acute distress  Eyes:    PERRL, conjunctiva/corneas clear, EOM's intact       Lungs:     Clear to auscultation bilaterally, respirations unlabored  Heart:    Normal heart rate. Normal rhythm. No murmurs, rubs, or gallops.   MS:   All extremities are  intact.   Neurologic:   Awake, alert, oriented x 3. No apparent focal neurological           defect.         No results found for any visits on 06/01/19.  Assessment & Plan     1. Grief  - FLUoxetine (PROZAC) 20 MG capsule; Take 1 capsule (20 mg total) by mouth daily.  Dispense: 30 capsule; Refill: 1  2. Memory problem  - Comprehensive metabolic panel - Lipid panel - Vitamin B12 - TSH - Folate  3. Hepatic C positive Given 3rd hepatitis B vaccine today   Return in about 3 weeks (around 06/22/2019).      The entirety of the information documented in the History of Present Illness, Review of Systems and Physical Exam were personally obtained by me. Portions of this information were initially documented  by the CMA and reviewed by me for thoroughness and accuracy.      Lelon Huh, MD  [facility] [phone] (phone) [phone] (fax)  [place]

## 2019-06-01 ENCOUNTER — Encounter: Payer: Self-pay | Admitting: Family Medicine

## 2019-06-01 ENCOUNTER — Ambulatory Visit (INDEPENDENT_AMBULATORY_CARE_PROVIDER_SITE_OTHER): Payer: Medicare Other | Admitting: Family Medicine

## 2019-06-01 ENCOUNTER — Other Ambulatory Visit: Payer: Self-pay

## 2019-06-01 VITALS — BP 119/64 | HR 81 | Temp 97.3°F | Wt 126.2 lb

## 2019-06-01 DIAGNOSIS — F4321 Adjustment disorder with depressed mood: Secondary | ICD-10-CM | POA: Diagnosis not present

## 2019-06-01 DIAGNOSIS — I10 Essential (primary) hypertension: Secondary | ICD-10-CM | POA: Diagnosis not present

## 2019-06-01 DIAGNOSIS — Z23 Encounter for immunization: Secondary | ICD-10-CM

## 2019-06-01 DIAGNOSIS — B1920 Unspecified viral hepatitis C without hepatic coma: Secondary | ICD-10-CM

## 2019-06-01 DIAGNOSIS — R413 Other amnesia: Secondary | ICD-10-CM | POA: Diagnosis not present

## 2019-06-01 DIAGNOSIS — B182 Chronic viral hepatitis C: Secondary | ICD-10-CM | POA: Insufficient documentation

## 2019-06-01 MED ORDER — FLUOXETINE HCL 20 MG PO CAPS: 20.0000 mg | ORAL_CAPSULE | Freq: Every day | ORAL | 1 refills | Status: DC

## 2019-06-01 NOTE — Patient Instructions (Signed)
.   Please review the attached list of medications and notify my office if there are any errors.   . Please bring all of your medications to every appointment so we can make sure that our medication list is the same as yours.   

## 2019-06-02 LAB — COMPREHENSIVE METABOLIC PANEL
ALT: 10 IU/L (ref 0–32)
AST: 18 IU/L (ref 0–40)
Albumin/Globulin Ratio: 1.6 (ref 1.2–2.2)
Albumin: 4.4 g/dL (ref 3.7–4.7)
Alkaline Phosphatase: 39 IU/L — ABNORMAL LOW (ref 48–121)
BUN/Creatinine Ratio: 23 (ref 12–28)
BUN: 20 mg/dL (ref 8–27)
Bilirubin Total: <0.2 mg/dL (ref 0.0–1.2)
CO2: 24 mmol/L (ref 20–29)
Calcium: 9.4 mg/dL (ref 8.7–10.3)
Chloride: 101 mmol/L (ref 96–106)
Creatinine, Ser: 0.86 mg/dL (ref 0.57–1.00)
GFR calc Af Amer: 76 mL/min/{1.73_m2} (ref >59)
GFR calc non Af Amer: 66 mL/min/{1.73_m2} (ref >59)
Globulin, Total: 2.7 g/dL (ref 1.5–4.5)
Glucose: 145 mg/dL — ABNORMAL HIGH (ref 65–99)
Potassium: 4.1 mmol/L (ref 3.5–5.2)
Sodium: 141 mmol/L (ref 134–144)
Total Protein: 7.1 g/dL (ref 6.0–8.5)

## 2019-06-02 LAB — LIPID PANEL
Chol/HDL Ratio: 4.2 ratio (ref 0.0–4.4)
Cholesterol, Total: 185 mg/dL (ref 100–199)
HDL: 44 mg/dL (ref >39)
LDL Chol Calc (NIH): 127 mg/dL — ABNORMAL HIGH (ref 0–99)
Triglycerides: 73 mg/dL (ref 0–149)
VLDL Cholesterol Cal: 14 mg/dL (ref 5–40)

## 2019-06-02 LAB — VITAMIN B12: Vitamin B-12: 1605 pg/mL — ABNORMAL HIGH (ref 232–1245)

## 2019-06-02 LAB — TSH: TSH: 1.06 u[IU]/mL (ref 0.450–4.500)

## 2019-06-02 LAB — FOLATE: Folate: >20 ng/mL (ref >3.0)

## 2019-06-04 ENCOUNTER — Telehealth: Payer: Self-pay

## 2019-06-04 NOTE — Telephone Encounter (Signed)
-----   Message from Birdie Sons, MD sent at 06/04/2019  1:21 PM EDT ----- Vitamin b12 levels are too high. Is patient taking a b12 supplement or getting b12 injections? Rest of labs are normal.

## 2019-06-04 NOTE — Telephone Encounter (Signed)
Patient returned called and given results as noted below by Dr. Caryn Section, she verbalized understanding. She says the only thing she takes is a Complete MVI for adults over 50 once a day. I advised someone from the office will call with his recommendations, she verbalized understanding.

## 2019-06-04 NOTE — Telephone Encounter (Signed)
Attempted to contact patient, no answer or voicemail. Okay for P[facility] to advise patient.  

## 2019-06-05 ENCOUNTER — Ambulatory Visit: Payer: Medicare Other | Admitting: Family Medicine

## 2019-06-13 ENCOUNTER — Telehealth: Payer: Self-pay | Admitting: *Deleted

## 2019-06-13 NOTE — Telephone Encounter (Signed)
Reviewed lab results and physician's note with the patient. Her daily MVI has 25 mcg of Vit B12. She will hold off taking this supplement until her next scheduled visit with Dr. Caryn Section.

## 2019-06-19 ENCOUNTER — Other Ambulatory Visit: Payer: Self-pay | Admitting: Family Medicine

## 2019-06-19 DIAGNOSIS — I10 Essential (primary) hypertension: Secondary | ICD-10-CM

## 2019-06-20 ENCOUNTER — Other Ambulatory Visit: Payer: Self-pay | Admitting: Family Medicine

## 2019-06-20 ENCOUNTER — Ambulatory Visit: Payer: Self-pay | Admitting: *Deleted

## 2019-06-20 ENCOUNTER — Other Ambulatory Visit: Payer: Self-pay

## 2019-06-20 DIAGNOSIS — G8929 Other chronic pain: Secondary | ICD-10-CM

## 2019-06-20 MED ORDER — LIDOCAINE VISCOUS HCL 2 % MT SOLN: OROMUCOSAL | 5 refills | Status: DC

## 2019-06-20 NOTE — Telephone Encounter (Signed)
No triage performed. Patient requesting refill for lidocaine for her chronic mouth pain-forwarded refill request to clinic due to not being delegated for [facility] to refill.

## 2019-06-20 NOTE — Telephone Encounter (Signed)
Copied from [place] [id number]. Topic: General - Other >> Jun 20, 2019  2:13 PM Leward Quan A wrote: Reason for CRM: Patient called in tears because she is requesting a refill on the Lidocaine for her mouth pain. She states that she is in a lot of pain and need this medication. She is asking for a call back from the nurse ASAP to discuss whether Dr Caryn Section will send Rx to the pharmacy today.

## 2019-06-20 NOTE — Addendum Note (Signed)
Addended by: Randal Buba on: 06/20/2019 02:41 PM   Modules accepted: Orders

## 2019-06-20 NOTE — Telephone Encounter (Signed)
Requested medication (s) are due for refill today:   No looks like requesting too early  Requested medication (s) are on the active medication list:   Yes  Future visit scheduled:   Yes in 5 days with Dr. Caryn Section   Last ordered: 04/18/2019 300 ml with 5 refills.  Returned because there is no protocol assigned to this medication.   Requested Prescriptions  Pending Prescriptions Disp Refills   lidocaine (XYLOCAINE) 2 % solution [Pharmacy Med Name: Lidocaine Viscous HCl 2 % Mouth/Throat Solution] 300 mL 0    Sig: USE AS DIRECTED. 15ML IN MOUTH OR THROAT EVERY 6 HOURS AS NEEDED FOR MOUTH PAIN      Off-Protocol Failed - 06/20/2019  9:13 AM      Failed - Medication not assigned to a protocol, review manually.      Passed - Valid encounter within last 12 months    Recent Outpatient Visits           2 weeks ago [place], Donald E, MD   3 months ago Pre-diabetes   [facility] Birdie Sons, MD   5 months ago Hepatitis C virus infection without hepatic coma, unspecified chronicity   [facility] Birdie Sons, MD   8 months ago Hepatitis C virus infection without hepatic coma, unspecified chronicity   [facility] Birdie Sons, MD   9 months ago Annual physical exam   [facility] Birdie Sons, MD       Future Appointments             In 5 days Fisher, Kirstie Peri, MD [facility], [place]   In 2 months Fisher, Kirstie Peri, MD [facility], [place]

## 2019-06-20 NOTE — Telephone Encounter (Signed)
Requested medication (s) are due for refill today: Should have refills  Requested medication (s) are on the active medication list: Yes  Last refill:  04/18/19  Future visit scheduled: Yes  Notes to clinic:  Medication off protocol. Pt. Should have refills.    Requested Prescriptions  Pending Prescriptions Disp Refills   lidocaine (XYLOCAINE) 2 % solution 300 mL 5      Off-Protocol Failed - 06/20/2019  8:31 AM      Failed - Medication not assigned to a protocol, review manually.      Passed - Valid encounter within last 12 months    Recent Outpatient Visits           2 weeks ago [place], Donald E, MD   3 months ago Pre-diabetes   [facility] Birdie Sons, MD   5 months ago Hepatitis C virus infection without hepatic coma, unspecified chronicity   [facility] Birdie Sons, MD   8 months ago Hepatitis C virus infection without hepatic coma, unspecified chronicity   [facility] Birdie Sons, MD   9 months ago Annual physical exam   [facility] Birdie Sons, MD       Future Appointments             In 5 days Fisher, Kirstie Peri, MD [facility], [place]   In 2 months Fisher, Kirstie Peri, MD [facility], [place]

## 2019-06-20 NOTE — Telephone Encounter (Signed)
Copied from [place] [id number]. Topic: Quick Communication - Rx Refill/Question >> Jun 20, 2019  8:24 AM Leward Quan A wrote: Medication: lidocaine (XYLOCAINE) 2 % solution Per patient pharmacy say there are no more refills  Has the patient contacted their pharmacy? Yes.   (Agent: If no, request that the patient contact the pharmacy for the refill.) (Agent: If yes, when and what did the pharmacy advise?)  Preferred Pharmacy (with phone number or street name): [place], [place] - [place]  Phone:  [phone] Fax:  [phone]     Agent: Please be advised that RX refills may take up to 3 business days. We ask that you follow-up with your pharmacy.

## 2019-06-25 ENCOUNTER — Encounter: Payer: Self-pay | Admitting: Family Medicine

## 2019-06-25 ENCOUNTER — Ambulatory Visit (INDEPENDENT_AMBULATORY_CARE_PROVIDER_SITE_OTHER): Payer: Medicare Other | Admitting: Family Medicine

## 2019-06-25 ENCOUNTER — Other Ambulatory Visit: Payer: Self-pay

## 2019-06-25 VITALS — BP 125/58 | HR 61 | Temp 97.1°F | Wt 124.6 lb

## 2019-06-25 DIAGNOSIS — R7303 Prediabetes: Secondary | ICD-10-CM | POA: Diagnosis not present

## 2019-06-25 DIAGNOSIS — F4321 Adjustment disorder with depressed mood: Secondary | ICD-10-CM

## 2019-06-25 DIAGNOSIS — R413 Other amnesia: Secondary | ICD-10-CM | POA: Diagnosis not present

## 2019-06-25 LAB — POCT GLYCOSYLATED HEMOGLOBIN (HGB A1C)
Est. average glucose Bld gHb Est-mCnc: 117
Hemoglobin A1C: 5.7 % — AB (ref 4.0–5.6)

## 2019-06-25 NOTE — Progress Notes (Signed)
Established patient visit   Patient: Joan Baldwin   DOB: [birth year]   74 y.o. Female  MRN: [id number] Visit Date: 06/25/2019  Today's healthcare provider: Lelon Huh, MD   Chief Complaint  Patient presents with  . Grief  . Memory loss   Mertie Moores as a scribe for Lelon Huh, MD.,have documented all relevant documentation on the behalf of Lelon Huh, MD,as directed by  Lelon Huh, MD while in the presence of Lelon Huh, MD.  Subjective    HPI Follow up for Grief & memory Loss:  The patient was last seen for this 3 weeks ago. Changes made at last visit include started Prozac 20 mg.  She reports good compliance with treatment. She feels that condition is Improved. She is having side effects. She states she is "shaky" occasionally, but it is not bothersome.  She is here with her daughter in law today who also states patient's mood has been better since starting fluoxetine.  She has also been strictly avoiding carbohydrates the last few months due to having a1c in pre-diabetic range.  -----------------------------------------------------------------------------------------    Medications: Outpatient Medications Prior to Visit  Medication Sig  . acetaminophen (TYLENOL) 500 MG tablet Take 500 mg by mouth every 8 (eight) hours as needed.   Marland Kitchen alendronate (FOSAMAX) 70 MG tablet TAKE 1 TABLET BY MOUTH EVERY 7 DAYS. TAKE WITH A FULL GLASS OF WATER ON EMPTY STOMACH  . amLODipine (NORVASC) 10 MG tablet Take 1 tablet (10 mg total) by mouth every evening.  Marland Kitchen aspirin 81 MG tablet Take 1 tablet by mouth daily.  . Calcium Carbonate-Vitamin D (CALCIUM 600/VITAMIN D PO) Take 1 tablet by mouth daily.   . diazepam (VALIUM) 10 MG tablet TAKE 1/2 (ONE-HALF) TO 1 TABLET BY MOUTH 4 TIMES DAILY AS NEEDED FOR ANXIETY  . Fiber POWD Take 2 scoop by mouth daily.  Marland Kitchen FLUoxetine (PROZAC) 20 MG capsule Take 1 capsule (20 mg total) by mouth daily.  . hydrochlorothiazide  (HYDRODIURIL) 25 MG tablet Take 1 tablet by mouth once daily  . latanoprost (XALATAN) 0.005 % ophthalmic solution Place 1 drop into both eyes at bedtime.  . lidocaine (XYLOCAINE) 2 % solution USE AS DIRECTED. 15ML IN MOUTH OR THROAT EVERY 4-6 HOURS AS NEEDED FOR MOUTH PAIN  . lisinopril (ZESTRIL) 40 MG tablet Take 1 tablet by mouth once daily  . Multiple Vitamins-Minerals (MULTIVITAMIN ADULT PO) Take 1 tablet by mouth daily.  . Omega-3 Fatty Acids (FISH OIL) 1000 MG CAPS Take 1 capsule by mouth daily.   Marland Kitchen omeprazole (PRILOSEC) 20 MG capsule Take 1 capsule by mouth once daily  . Timolol Maleate 0.5 % (DAILY) SOLN Place 1 drop into both eyes at bedtime.  . valACYclovir (VALTREX) 1000 MG tablet TAKE 2 TABLETS BY MOUTH TWICE DAILY FOR 1 DAY AS NEEDED FOR COLD SORES   No facility-administered medications prior to visit.    Review of Systems  Constitutional: Negative.   Respiratory: Negative.   Cardiovascular: Negative.   Musculoskeletal: Negative.   Neurological:       Memory loss      Objective    BP (!) 125/58 (BP Location: Right Arm, Patient Position: Sitting, Cuff Size: Normal)   Pulse 61   Temp (!) 97.1 F (36.2 C) (Temporal)   Wt 124 lb 9.6 oz (56.5 kg)   BMI 19.81 kg/m   Physical Exam   General: Appearance:    Well developed, well nourished female in no acute distress  Eyes:  PERRL, conjunctiva/corneas clear, EOM's intact       Lungs:     Clear to auscultation bilaterally, respirations unlabored  Heart:    Normal heart rate. Normal rhythm. No murmurs, rubs, or gallops.   MS:   All extremities are intact.   Neurologic:   Awake, alert, oriented x 3. No apparent focal neurological           defect.       Results for orders placed or performed in visit on 06/25/19  POCT HgB A1C  Result Value Ref Range   Hemoglobin A1C 5.7 (A) 4.0 - 5.6 %   Est. average glucose Bld gHb Est-mCnc 117     Assessment & Plan     1. Grief Is going better since starting fluoxetine about 3  weeks ago. Continue current dose for now.   2. Memory problem Likely due to grief reaction and somewhat improved since last visit.   3. Pre-diabetes A1c is improved. She is also noted to have lost 2 pounds since last visit. Advised she can loosen dietary restrictions a bit. Some weight loss may be related to initiation of fluoxetine in which case I anticipate this is transitory.  Will reassess in about 3 months.    No follow-ups on file.      The entirety of the information documented in the History of Present Illness, Review of Systems and Physical Exam were personally obtained by me. Portions of this information were initially documented by the CMA and reviewed by me for thoroughness and accuracy.      Lelon Huh, MD  [facility] [phone] (phone) [phone] (fax)  [place]

## 2019-06-29 ENCOUNTER — Telehealth: Payer: Self-pay | Admitting: *Deleted

## 2019-06-29 ENCOUNTER — Ambulatory Visit: Payer: Self-pay | Admitting: *Deleted

## 2019-06-29 DIAGNOSIS — F419 Anxiety disorder, unspecified: Secondary | ICD-10-CM

## 2019-06-29 MED ORDER — SERTRALINE HCL 25 MG PO TABS: 25.0000 mg | ORAL_TABLET | Freq: Every day | ORAL | 1 refills | Status: DC

## 2019-06-29 NOTE — Addendum Note (Signed)
Addended by: Jules Schick on: 06/29/2019 02:52 PM   Modules accepted: Orders

## 2019-06-29 NOTE — Telephone Encounter (Signed)
Per initial encounter, "Patient requesting to speak with nurse regarding this medication. She states it makes her feel uneasy and makes her tremble. "; the pt states her symptoms started when she started the medication on 06/01/19 per Dr Caryn Section, [facility]; the pt also says she discussed this at her last visit on 06/25/19; the pt states she does not want to take this medication, and would like something else if possible; she can be contacted at [phone]; the pt  uses [facility]; will route to office for final disposition   Reason for Disposition  [1] Caller has URGENT medication question about med that PCP or specialist prescribed AND [2] triager unable to answer question  Answer Assessment - Initial Assessment Questions 1.   NAME of MEDICATION: "What medicine are you calling about?"     prozac 2.   QUESTION: "What is your question?"     "Do I have to continue taking this?" 3.   PRESCRIBING HCP: "Who prescribed it?" Reason: if prescribed by specialist, call should be referred to that group.     Dr Lelon Huh 4. SYMPTOMS: "Do you have any symptoms?"     Feels unable and trembles 5. SEVERITY: If symptoms are present, ask "Are they mild, moderate or severe?"    6.  PREGNANCY:  "Is there any chance that you are pregnant?" "When was your last menstrual period?"  Protocols used: MEDICATION QUESTION CALL-A-AH

## 2019-06-29 NOTE — Telephone Encounter (Signed)
RX canceled

## 2019-06-29 NOTE — Addendum Note (Signed)
Addended by: Lelon Huh E on: 06/29/2019 11:25 AM   Modules accepted: Orders

## 2019-06-29 NOTE — Telephone Encounter (Signed)
Please advise pt have sent prescription for [facility]ertraline to [facility]. She can take this in place of fluoxetine. I think she will tolerate this medication better.

## 2019-06-29 NOTE — Telephone Encounter (Signed)
There was an earlier message that she was wanting to stop it take something else, so I sent prescription for sertraline to her pharmacy. Please check with patient see what she wants to do. If she wants to stay on Prozac then will need to call pharmacy and cancel the sertraline prescription.

## 2019-06-29 NOTE — Telephone Encounter (Signed)
Copied from [place] [id number]. Topic: General - Other >> Jun 29, 2019 11:07 AM Mcneil, Ja-Kwan wrote: Reason for CRM: Pt called to report that she will get her Rx for FLUoxetine (PROZAC) 20 MG capsule and take it as directed. Pt stated previously she did not agree to take the medication but she wants the doctor to know she has decided to take it.

## 2019-06-29 NOTE — Telephone Encounter (Signed)
Ok, can you please have [facility] in [place] cancel the sertraline prescription.

## 2019-06-29 NOTE — Telephone Encounter (Signed)
Patient decided to continue taking Prozac instead of switching to Sertraline.

## 2019-06-29 NOTE — Telephone Encounter (Signed)
Patient called to inform Dr. Caryn Section that she will continue taking her Prozac the way that she had been taking her medication.

## 2019-06-29 NOTE — Telephone Encounter (Signed)
Pt is requesting a call back today, she wants to make sure she is doing what the PCP recommends. She says she will continue with Prozac.  Best contact: [phone]

## 2019-07-18 ENCOUNTER — Other Ambulatory Visit: Payer: Self-pay | Admitting: Family Medicine

## 2019-07-18 DIAGNOSIS — I10 Essential (primary) hypertension: Secondary | ICD-10-CM

## 2019-07-24 ENCOUNTER — Encounter: Payer: Self-pay | Admitting: Family Medicine

## 2019-07-24 ENCOUNTER — Other Ambulatory Visit: Payer: Self-pay | Admitting: Family Medicine

## 2019-07-24 DIAGNOSIS — F4321 Adjustment disorder with depressed mood: Secondary | ICD-10-CM

## 2019-07-25 MED ORDER — FLUOXETINE HCL 20 MG PO CAPS: 20.0000 mg | ORAL_CAPSULE | Freq: Every day | ORAL | 1 refills | Status: DC

## 2019-08-13 ENCOUNTER — Other Ambulatory Visit: Payer: Self-pay | Admitting: Family Medicine

## 2019-08-13 DIAGNOSIS — G8929 Other chronic pain: Secondary | ICD-10-CM

## 2019-08-13 DIAGNOSIS — K089 Disorder of teeth and supporting structures, unspecified: Secondary | ICD-10-CM

## 2019-08-13 NOTE — Progress Notes (Signed)
Subjective:   Joan Baldwin is a 74 y.o. female who presents for Medicare Annual (Subsequent) preventive examination.  I connected with Lavayah Hamberger today by telephone and verified that I am speaking with the correct person using two identifiers. Location patient: home Location provider: work Persons participating in the virtual visit: patient, provider.   I discussed the limitations, risks, security and privacy concerns of performing an evaluation and management service by telephone and the availability of in person appointments. I also discussed with the patient that there may be a patient responsible charge related to this service. The patient expressed understanding and verbally consented to this telephonic visit.    Interactive audio and video telecommunications were attempted between this provider and patient, however failed, due to patient having technical difficulties OR patient did not have access to video capability.  We continued and completed visit with audio only.   Review of Systems    N/A  Cardiac Risk Factors include: advanced age (>58men, >51 women);hypertension     Objective:    There were no vitals filed for this visit. There is no height or weight on file to calculate BMI.  Advanced Directives 08/14/2019 06/06/2018 05/31/2017 05/26/2016 01/17/2016 01/17/2016  Does Patient Have a Medical Advance Directive? Yes Yes No No No No  Type of Paramedic of [place];Living will [place];Living will - - - -  Copy of [place] in Chart? Yes - validated most recent copy scanned in chart (See row information) Yes - validated most recent copy scanned in chart (See row information) - - - -  Would patient like information on creating a medical advance directive? - - Yes (MAU/Ambulatory/Procedural Areas - Information given) Yes (ED - Information included in AVS) No - Patient declined -    Current Medications  (verified) Outpatient Encounter Medications as of 08/14/2019  Medication Sig  . acetaminophen (TYLENOL) 500 MG tablet Take 500 mg by mouth every 8 (eight) hours as needed.   Marland Kitchen alendronate (FOSAMAX) 70 MG tablet TAKE 1 TABLET BY MOUTH EVERY 7 DAYS. TAKE WITH A FULL GLASS OF WATER ON EMPTY STOMACH  . amLODipine (NORVASC) 10 MG tablet Take 1 tablet (10 mg total) by mouth every evening.  Marland Kitchen aspirin 81 MG tablet Take 1 tablet by mouth daily.  . Calcium Carbonate-Vitamin D (CALCIUM 600/VITAMIN D PO) Take 1 tablet by mouth daily.   . diazepam (VALIUM) 10 MG tablet TAKE 1/2 (ONE-HALF) TO 1 TABLET BY MOUTH 4 TIMES DAILY AS NEEDED FOR ANXIETY  . Fiber POWD Take 2 scoop by mouth daily.  Marland Kitchen FLUoxetine (PROZAC) 20 MG capsule Take 1 capsule (20 mg total) by mouth daily.  . hydrochlorothiazide (HYDRODIURIL) 25 MG tablet Take 1 tablet by mouth once daily (Patient taking differently: As needed for fluid retention)  . latanoprost (XALATAN) 0.005 % ophthalmic solution Place 1 drop into both eyes at bedtime.  . lidocaine (XYLOCAINE) 2 % solution USE AS DIRECTED 15ML IN MOUTH OR THROAT EVERY 4-6 HOURS AS NEEDED FOR MOUTH PAIN  . lisinopril (ZESTRIL) 40 MG tablet Take 1 tablet by mouth once daily  . Multiple Vitamins-Minerals (MULTIVITAMIN ADULT PO) Take 1 tablet by mouth daily.  . Omega-3 Fatty Acids (FISH OIL) 1000 MG CAPS Take 1 capsule by mouth daily.   Marland Kitchen omeprazole (PRILOSEC) 20 MG capsule Take 1 capsule by mouth once daily  . Timolol Maleate 0.5 % (DAILY) SOLN Place 1 drop into both eyes at bedtime.  . valACYclovir (  VALTREX) 1000 MG tablet TAKE 2 TABLETS BY MOUTH TWICE DAILY FOR 1 DAY AS NEEDED FOR COLD SORES  . [DISCONTINUED] lidocaine (XYLOCAINE) 2 % solution USE AS DIRECTED. 15ML IN MOUTH OR THROAT EVERY 4-6 HOURS AS NEEDED FOR MOUTH PAIN   No facility-administered encounter medications on file as of 08/14/2019.    Allergies (verified) Gabapentin, Codeine, Fluoxetine, Tramadol hcl, and Ultram  [tramadol hcl]    History: Past Medical History:  Diagnosis Date  . Cataract   . Glaucoma   . History of chicken pox   . Hypertension   . Osteoporosis   . Sepsis ([place]) 01/17/2016   Past Surgical History:  Procedure Laterality Date  . ABDOMINAL HYSTERECTOMY     vaginal. Still has both ovaries  . back surgeries     x 2  . COLONOSCOPY  2007   done in [place] per patient. Normal except for moderate hemorrhoids  . MOUTH SURGERY  2011  . STOMACH SURGERY     staples for obesity  . Tailbone surgery     Family History  Problem Relation Age of Onset  . Emphysema Mother   . Kidney failure Sister   . Heart Problems Brother   . Diabetes Brother   . Skin cancer Brother   . Breast cancer Neg Hx    Social History   Socioeconomic History  . Marital status: Widowed    Spouse name: Not on file  . Number of children: 3  . Years of education: Not on file  . Highest education level: 12th grade  Occupational History  . Occupation: Retired  Tobacco Use  . Smoking status: Former Smoker    Packs/day: 1.00    Years: 30.00    Pack years: 30.00    Types: Cigarettes    Quit date: 01/11/2001    Years since quitting: 18.6  . Smokeless tobacco: Never Used  Vaping Use  . Vaping Use: Never used  Substance and Sexual Activity  . Alcohol use: No    Alcohol/week: 0.0 standard drinks  . Drug use: No  . Sexual activity: Not on file  Other Topics Concern  . Not on file  Social History Narrative  . Not on file   Social Determinants of Health   Financial Resource Strain: Low Risk   . Difficulty of Paying Living Expenses: Not hard at all  Food Insecurity: No Food Insecurity  . Worried About Charity fundraiser in the Last Year: Never true  . Ran Out of Food in the Last Year: Never true  Transportation Needs: No Transportation Needs  . Lack of Transportation (Medical): No  . Lack of Transportation (Non-Medical): No  Physical Activity: Inactive  . Days of Exercise per Week: 0 days  . Minutes of  Exercise per Session: 0 min  Stress: No Stress Concern Present  . Feeling of Stress : Not at all  Social Connections: Socially Isolated  . Frequency of Communication with Friends and Family: More than three times a week  . Frequency of Social Gatherings with Friends and Family: More than three times a week  . Attends Religious Services: Never  . Active Member of Clubs or Organizations: No  . Attends Archivist Meetings: Never  . Marital Status: Widowed    Tobacco Counseling Counseling given: Not Answered   Clinical Intake:  Pre-visit preparation completed: Yes  Pain : No/denies pain     Nutritional Risks: None Diabetes: No  How often do you need to have someone help  you when you read instructions, pamphlets, or other written materials from your doctor or pharmacy?: 1 - Never  Diabetic? Pre-diabetic  Interpreter Needed?: No  Information entered by :: [facility], LPN   Activities of Daily Living In your present state of health, do you have any difficulty performing the following activities: 08/14/2019 08/30/2018  Hearing? N N  Vision? Y Y  Comment Has a cataract on the left eye. -  Difficulty concentrating or making decisions? N N  Walking or climbing stairs? N N  Dressing or bathing? N N  Doing errands, shopping? N N  Preparing Food and eating ? N -  Using the Toilet? N -  In the past six months, have you accidently leaked urine? N -  Do you have problems with loss of bowel control? N -  Managing your Medications? N -  Managing your Finances? N -  Housekeeping or managing your Housekeeping? N -  Some recent data might be hidden    Patient Care Team: Birdie Sons, MD as PCP - General (Family Medicine) Birder Robson, MD as Referring Physician (Ophthalmology)  Indicate any recent Medical Services you may have received from other than [facility] providers in the past year (date may be approximate).     Assessment:   This is a routine wellness  examination for Jimmye.  Hearing/Vision screen No exam data present  Dietary issues and exercise activities discussed: Current Exercise Habits: The patient does not participate in regular exercise at present, Exercise limited by: None identified  Goals    . DIET - INCREASE WATER INTAKE     Recommend increasing water intake to 4-6 glasses a day.     . Exercise 3x per week (30 min per time)     Recommend exercising 4 days a week for 30 minutes. Pt plans to do this once feeling better.       Depression Screen PHQ 2/9 Scores 08/14/2019 08/30/2018 06/06/2018 05/31/2017 05/26/2016 05/26/2016 11/26/2014  PHQ - 2 Score 4 0 0 1 0 0 0  PHQ- 9 Score 11 1 - - 0 - -    Fall Risk Fall Risk  08/14/2019 08/30/2018 06/06/2018 05/31/2017 05/26/2016  Falls in the past year? 0 1 1 No No  Number falls in past yr: 0 0 0 - -  Injury with Fall? 0 0 0 - -  Follow up - Falls evaluation completed Falls prevention discussed - -    Any stairs in or around the home? Yes  If so, are there any without handrails? No  Home free of loose throw rugs in walkways, pet beds, electrical cords, etc? Yes  Adequate lighting in your home to reduce risk of falls? Yes   ASSISTIVE DEVICES UTILIZED TO PREVENT FALLS:  Life alert? No  Use of a cane, walker or w/c? No  Grab bars in the bathroom? Yes  Shower chair or bench in shower? No  Elevated toilet seat or a handicapped toilet? No    Cognitive Function:     6CIT Screen 08/14/2019 06/06/2018 05/26/2016  What Year? 0 points 0 points 0 points  What month? 0 points 0 points 0 points  What time? 0 points 0 points 0 points  Count back from 20 0 points 0 points 0 points  Months in reverse 0 points 2 points 0 points  Repeat phrase 2 points 0 points 6 points  Total Score 2 2 6     Immunizations Immunization History  Administered Date(s) Administered  . Hepatitis B, adult 10/23/2018,  12/25/2018  . Hepb-cpg 06/01/2019  . Influenza, High Dose Seasonal PF 11/26/2014, 09/05/2015,  10/10/2016, 10/03/2017  . Influenza-Unspecified 10/12/2018  . PFIZER SARS-COV-2 Vaccination 04/17/2019, 05/08/2019  . Pneumococcal Conjugate-13 11/26/2014  . Pneumococcal Polysaccharide-23 09/10/2008, 12/19/2009  . Zoster 11/21/2012  . Zoster Recombinat (Shingrix) 08/22/2018, 11/01/2018    TDAP status: Due, Education has been provided regarding the importance of this vaccine. Advised may receive this vaccine at local pharmacy or Health Dept. Aware to provide a copy of the vaccination record if obtained from local pharmacy or Health Dept. Verbalized acceptance and understanding. Flu Vaccine status: Up to date Pneumococcal vaccine status: Up to date Covid-19 vaccine status: Completed vaccines  Qualifies for Shingles Vaccine? Yes   Zostavax completed Yes   Shingrix Completed?: Yes  Screening Tests Health Maintenance  Topic Date Due  . DEXA SCAN  07/29/2018  . INFLUENZA VACCINE  08/12/2019  . TETANUS/TDAP  08/13/2020 (Originally 02/08/1964)  . COLONOSCOPY  12/23/2021  . COVID-19 Vaccine  Completed  . Hepatitis C Screening  Completed  . PNA vac Low Risk Adult  Completed    Health Maintenance  Health Maintenance Due  Topic Date Due  . DEXA SCAN  07/29/2018  . INFLUENZA VACCINE  08/12/2019    Colorectal cancer screening: Completed 12/23/16. Repeat every 5 years Mammogram status: No longer required.  Bone Density status: Completed 07/28/16. Results reflect: Bone density results: OSTEOPOROSIS. Repeat every 2 years. Pt declined a DEXA order today.   Lung Cancer Screening: (Low Dose CT Chest recommended if Age 68-80 years, 30 pack-year currently smoking OR have quit w/in 15years.) does not qualify.   Additional Screening:  Hepatitis C Screening: Up to date  Vision Screening: Recommended annual ophthalmology exams for early detection of glaucoma and other disorders of the eye. Is the patient up to date with their annual eye exam?  Yes  Who is the provider or what is the name of  the office in which the patient attends annual eye exams? Dr George Ina @ [place] If pt is not established with a provider, would they like to be referred to a provider to establish care? No .   Dental Screening: Recommended annual dental exams for proper oral hygiene  Community Resource Referral / Chronic Care Management: CRR required this visit?  No   CCM required this visit?  No      Plan:     I have personally reviewed and noted the following in the patient's chart:   . Medical and social history . Use of alcohol, tobacco or illicit drugs  . Current medications and supplements . Functional ability and status . Nutritional status . Physical activity . Advanced directives . List of other physicians . Hospitalizations, surgeries, and ER visits in previous 12 months . Vitals . Screenings to include cognitive, depression, and falls . Referrals and appointments  In addition, I have reviewed and discussed with patient certain preventive protocols, quality metrics, and best practice recommendations. A written personalized care plan for preventive services as well as general preventive health recommendations were provided to patient.     Jailene Cupit [place], [place]   03/12/5174   Nurse Notes: Declined a DEXA order today.

## 2019-08-13 NOTE — Telephone Encounter (Signed)
Requested medication (s) are due for refill today - no-should have RF  Requested medication (s) are on the active medication list -yes  Future visit scheduled -yes  Last refill: 06/20/19 5 RF  Notes to clinic: Request for RF of medication not assigned protocol  Requested Prescriptions  Pending Prescriptions Disp Refills   lidocaine (XYLOCAINE) 2 % solution [Pharmacy Med Name: Lidocaine Viscous HCl 2 % Mouth/Throat Solution] 400 mL 0    Sig: USE AS DIRECTED. 15ML IN MOUTH OR THROAT EVERY 4-6 HOURS AS NEEDED FOR MOUTH PAIN      Off-Protocol Failed - 08/13/2019  1:44 PM      Failed - Medication not assigned to a protocol, review manually.      Passed - Valid encounter within last 12 months    Recent Outpatient Visits           1 month ago [place], Donald E, MD   2 months ago [place], Donald E, MD   5 months ago Pre-diabetes   [facility] Birdie Sons, MD   7 months ago Hepatitis C virus infection without hepatic coma, unspecified chronicity   [facility] Birdie Sons, MD   9 months ago Hepatitis C virus infection without hepatic coma, unspecified chronicity   [place], [place]       Future Appointments             In 1 month Fisher, Kirstie Peri, MD [facility]                Requested Prescriptions  Pending Prescriptions Disp Refills   lidocaine (XYLOCAINE) 2 % solution [Pharmacy Med Name: Lidocaine Viscous HCl 2 % Mouth/Throat Solution] 400 mL 0    Sig: USE AS DIRECTED. 15ML IN MOUTH OR THROAT EVERY 4-6 HOURS AS NEEDED FOR MOUTH PAIN      Off-Protocol Failed - 08/13/2019  1:44 PM      Failed - Medication not assigned to a protocol, review manually.      Passed - Valid encounter within last 12 months    Recent Outpatient Visits           1 month ago [place], Donald E, MD   2  months ago [place], Donald E, MD   5 months ago Pre-diabetes   [facility] Birdie Sons, MD   7 months ago Hepatitis C virus infection without hepatic coma, unspecified chronicity   [facility] Birdie Sons, MD   9 months ago Hepatitis C virus infection without hepatic coma, unspecified chronicity   [facility] Birdie Sons, MD       Future Appointments             In 1 month Fisher, Kirstie Peri, MD [facility], [place]

## 2019-08-14 ENCOUNTER — Other Ambulatory Visit: Payer: Self-pay

## 2019-08-14 ENCOUNTER — Ambulatory Visit (INDEPENDENT_AMBULATORY_CARE_PROVIDER_SITE_OTHER): Payer: Medicare Other

## 2019-08-14 DIAGNOSIS — Z0000 Encounter for general adult medical examination without abnormal findings: Secondary | ICD-10-CM

## 2019-08-14 NOTE — Patient Instructions (Signed)
Joan Baldwin , Thank you for taking time to come for your Medicare Wellness Visit. I appreciate your ongoing commitment to your health goals. Please review the following plan we discussed and let me know if I can assist you in the future.   Screening recommendations/referrals: Colonoscopy: Up to date, due 12/2021 Mammogram: No longer required.  Bone Density: Currently due, declined order today. Recommended yearly ophthalmology/optometry visit for glaucoma screening and checkup Recommended yearly dental visit for hygiene and checkup  Vaccinations: Influenza vaccine: Due fall 2021 Pneumococcal vaccine: Completed series Tdap vaccine: Currently due, declined today. Shingles vaccine: Completed series    Advanced directives: Currently on file.  Conditions/risks identified: Recommend increasing water intake to 6-8 8 oz glasses a day.  Next appointment: 09/25/19 @ 2:40 PM with Dr Caryn Section    Preventive Care 7 Years and Older, Female Preventive care refers to lifestyle choices and visits with your health care provider that can promote health and wellness. What does preventive care include?  A yearly physical exam. This is also called an annual well check.  Dental exams once or twice a year.  Routine eye exams. Ask your health care provider how often you should have your eyes checked.  Personal lifestyle choices, including:  Daily care of your teeth and gums.  Regular physical activity.  Eating a healthy diet.  Avoiding tobacco and drug use.  Limiting alcohol use.  Practicing safe sex.  Taking low-dose aspirin every day.  Taking vitamin and mineral supplements as recommended by your health care provider. What happens during an annual well check? The services and screenings done by your health care provider during your annual well check will depend on your age, overall health, lifestyle risk factors, and family history of disease. Counseling  Your health care provider may ask  you questions about your:  Alcohol use.  Tobacco use.  Drug use.  Emotional well-being.  Home and relationship well-being.  Sexual activity.  Eating habits.  History of falls.  Memory and ability to understand (cognition).  Work and work Statistician.  Reproductive health. Screening  You may have the following tests or measurements:  Height, weight, and BMI.  Blood pressure.  Lipid and cholesterol levels. These may be checked every 5 years, or more frequently if you are over 77 years old.  Skin check.  Lung cancer screening. You may have this screening every year starting at age 93 if you have a 30-pack-year history of smoking and currently smoke or have quit within the past 15 years.  Fecal occult blood test (FOBT) of the stool. You may have this test every year starting at age 10.  Flexible sigmoidoscopy or colonoscopy. You may have a sigmoidoscopy every 5 years or a colonoscopy every 10 years starting at age 51.  Hepatitis C blood test.  Hepatitis B blood test.  Sexually transmitted disease (STD) testing.  Diabetes screening. This is done by checking your blood sugar (glucose) after you have not eaten for a while (fasting). You may have this done every 1-3 years.  Bone density scan. This is done to screen for osteoporosis. You may have this done starting at age 51.  Mammogram. This may be done every 1-2 years. Talk to your health care provider about how often you should have regular mammograms. Talk with your health care provider about your test results, treatment options, and if necessary, the need for more tests. Vaccines  Your health care provider may recommend certain vaccines, such as:  Influenza vaccine. This is recommended  every year.  Tetanus, diphtheria, and acellular pertussis (Tdap, Td) vaccine. You may need a Td booster every 10 years.  Zoster vaccine. You may need this after age 67.  Pneumococcal 13-valent conjugate (PCV13) vaccine. One dose  is recommended after age 38.  Pneumococcal polysaccharide (PPSV23) vaccine. One dose is recommended after age 29. Talk to your health care provider about which screenings and vaccines you need and how often you need them. This information is not intended to replace advice given to you by your health care provider. Make sure you discuss any questions you have with your health care provider. Document Released: 01/24/2015 Document Revised: 09/17/2015 Document Reviewed: 10/29/2014 Elsevier Interactive Patient Education  2017 [place] Prevention in the Home Falls can cause injuries. They can happen to people of all ages. There are many things you can do to make your home safe and to help prevent falls. What can I do on the outside of my home?  Regularly fix the edges of walkways and driveways and fix any cracks.  Remove anything that might make you trip as you walk through a door, such as a raised step or threshold.  Trim any bushes or trees on the path to your home.  Use bright outdoor lighting.  Clear any walking paths of anything that might make someone trip, such as rocks or tools.  Regularly check to see if handrails are loose or broken. Make sure that both sides of any steps have handrails.  Any raised decks and porches should have guardrails on the edges.  Have any leaves, snow, or ice cleared regularly.  Use sand or salt on walking paths during winter.  Clean up any spills in your garage right away. This includes oil or grease spills. What can I do in the bathroom?  Use night lights.  Install grab bars by the toilet and in the tub and shower. Do not use towel bars as grab bars.  Use non-skid mats or decals in the tub or shower.  If you need to sit down in the shower, use a plastic, non-slip stool.  Keep the floor dry. Clean up any water that spills on the floor as soon as it happens.  Remove soap buildup in the tub or shower regularly.  Attach bath mats  securely with double-sided non-slip rug tape.  Do not have throw rugs and other things on the floor that can make you trip. What can I do in the bedroom?  Use night lights.  Make sure that you have a light by your bed that is easy to reach.  Do not use any sheets or blankets that are too big for your bed. They should not hang down onto the floor.  Have a firm chair that has side arms. You can use this for support while you get dressed.  Do not have throw rugs and other things on the floor that can make you trip. What can I do in the kitchen?  Clean up any spills right away.  Avoid walking on wet floors.  Keep items that you use a lot in easy-to-reach places.  If you need to reach something above you, use a strong step stool that has a grab bar.  Keep electrical cords out of the way.  Do not use floor polish or wax that makes floors slippery. If you must use wax, use non-skid floor wax.  Do not have throw rugs and other things on the floor that can make you trip. What  can I do with my stairs?  Do not leave any items on the stairs.  Make sure that there are handrails on both sides of the stairs and use them. Fix handrails that are broken or loose. Make sure that handrails are as long as the stairways.  Check any carpeting to make sure that it is firmly attached to the stairs. Fix any carpet that is loose or worn.  Avoid having throw rugs at the top or bottom of the stairs. If you do have throw rugs, attach them to the floor with carpet tape.  Make sure that you have a light switch at the top of the stairs and the bottom of the stairs. If you do not have them, ask someone to add them for you. What else can I do to help prevent falls?  Wear shoes that:  Do not have high heels.  Have rubber bottoms.  Are comfortable and fit you well.  Are closed at the toe. Do not wear sandals.  If you use a stepladder:  Make sure that it is fully opened. Do not climb a closed  stepladder.  Make sure that both sides of the stepladder are locked into place.  Ask someone to hold it for you, if possible.  Clearly mark and make sure that you can see:  Any grab bars or handrails.  First and last steps.  Where the edge of each step is.  Use tools that help you move around (mobility aids) if they are needed. These include:  Canes.  Walkers.  Scooters.  Crutches.  Turn on the lights when you go into a dark area. Replace any light bulbs as soon as they burn out.  Set up your furniture so you have a clear path. Avoid moving your furniture around.  If any of your floors are uneven, fix them.  If there are any pets around you, be aware of where they are.  Review your medicines with your doctor. Some medicines can make you feel dizzy. This can increase your chance of falling. Ask your doctor what other things that you can do to help prevent falls. This information is not intended to replace advice given to you by your health care provider. Make sure you discuss any questions you have with your health care provider. Document Released: 10/24/2008 Document Revised: 06/05/2015 Document Reviewed: 02/01/2014 Elsevier Interactive Patient Education  2017 [employer].

## 2019-08-21 ENCOUNTER — Telehealth: Payer: Self-pay

## 2019-08-21 NOTE — Telephone Encounter (Signed)
Copied from [place] [phone]. Topic: General - Inquiry >> Aug 20, 2019  5:14 PM Alease Frame wrote: Reason for CRM: Pt is wanting a call back to talk about her info in chart . Please advise

## 2019-08-21 NOTE — Telephone Encounter (Signed)
Patient wants to know when her next appointment is scheduled. Patient advised.

## 2019-08-24 ENCOUNTER — Ambulatory Visit: Payer: Self-pay | Admitting: Family Medicine

## 2019-08-29 ENCOUNTER — Other Ambulatory Visit: Payer: Self-pay | Admitting: Family Medicine

## 2019-08-29 DIAGNOSIS — G8929 Other chronic pain: Secondary | ICD-10-CM

## 2019-08-29 DIAGNOSIS — K089 Disorder of teeth and supporting structures, unspecified: Secondary | ICD-10-CM

## 2019-08-29 NOTE — Telephone Encounter (Signed)
Patient calling to check status of request. Advised patient that pharmacy had requested it today. Patient asked if I could send a message to Dr. Caryn Section stating that she is in a considerable amount of pain and that Dr. Caryn Section is already aware of dental pain she has.

## 2019-09-05 DIAGNOSIS — H2512 Age-related nuclear cataract, left eye: Secondary | ICD-10-CM | POA: Diagnosis not present

## 2019-09-15 ENCOUNTER — Other Ambulatory Visit: Payer: Self-pay | Admitting: Family Medicine

## 2019-09-15 DIAGNOSIS — G8929 Other chronic pain: Secondary | ICD-10-CM

## 2019-09-15 DIAGNOSIS — K089 Disorder of teeth and supporting structures, unspecified: Secondary | ICD-10-CM

## 2019-09-25 ENCOUNTER — Ambulatory Visit (INDEPENDENT_AMBULATORY_CARE_PROVIDER_SITE_OTHER): Payer: Medicare Other | Admitting: Family Medicine

## 2019-09-25 ENCOUNTER — Encounter: Payer: Self-pay | Admitting: Family Medicine

## 2019-09-25 ENCOUNTER — Other Ambulatory Visit: Payer: Self-pay

## 2019-09-25 VITALS — BP 126/78 | HR 71 | Temp 98.9°F | Ht 66.0 in | Wt 122.2 lb

## 2019-09-25 DIAGNOSIS — R7303 Prediabetes: Secondary | ICD-10-CM | POA: Diagnosis not present

## 2019-09-25 DIAGNOSIS — I10 Essential (primary) hypertension: Secondary | ICD-10-CM | POA: Diagnosis not present

## 2019-09-25 DIAGNOSIS — Z23 Encounter for immunization: Secondary | ICD-10-CM

## 2019-09-25 DIAGNOSIS — K089 Disorder of teeth and supporting structures, unspecified: Secondary | ICD-10-CM

## 2019-09-25 DIAGNOSIS — G8929 Other chronic pain: Secondary | ICD-10-CM

## 2019-09-25 DIAGNOSIS — F419 Anxiety disorder, unspecified: Secondary | ICD-10-CM | POA: Diagnosis not present

## 2019-09-25 DIAGNOSIS — R270 Ataxia, unspecified: Secondary | ICD-10-CM | POA: Diagnosis not present

## 2019-09-25 DIAGNOSIS — F4321 Adjustment disorder with depressed mood: Secondary | ICD-10-CM

## 2019-09-25 LAB — POCT GLYCOSYLATED HEMOGLOBIN (HGB A1C): Hemoglobin A1C: 5.6 % (ref 4.0–5.6)

## 2019-09-25 MED ORDER — DIAZEPAM 10 MG PO TABS: ORAL_TABLET | ORAL | 5 refills | Status: DC

## 2019-09-25 MED ORDER — LIDOCAINE VISCOUS HCL 2 % MT SOLN: OROMUCOSAL | 5 refills | Status: DC

## 2019-09-25 NOTE — Progress Notes (Signed)
Established patient visit   Patient: Joan Baldwin   DOB: 1945-09-15   74 y.o. Female  MRN: [id number] Visit Date: 09/25/2019  Today's healthcare provider: Lelon Huh, MD   Chief Complaint  Patient presents with  . Hypertension  . Hyperglycemia  . grief   Subjective    HPI  Hypertension, follow-up  BP Readings from Last 3 Encounters:  09/25/19 126/78  06/25/19 (!) 125/58  06/01/19 119/64   Wt Readings from Last 3 Encounters:  09/25/19 122 lb 3.2 oz (55.4 kg)  06/25/19 124 lb 9.6 oz (56.5 kg)  06/01/19 126 lb 3.2 oz (57.2 kg)     She was last seen for hypertension 3 months ago.  BP at that visit was 125/58. Management since that visit includes no changes.  She reports good compliance with treatment. She is not having side effects.  She is following a Regular diet. She is not exercising. She does not smoke.  Use of agents associated with hypertension: none.   Outside blood pressures are not being checked . Symptoms: No chest pain No chest pressure  No palpitations No syncope  No dyspnea No orthopnea  No paroxysmal nocturnal dyspnea No lower extremity edema   Pertinent labs: Lab Results  Component Value Date   CHOL 185 06/01/2019   HDL 44 06/01/2019   LDLCALC 127 (H) 06/01/2019   TRIG 73 06/01/2019   CHOLHDL 4.2 06/01/2019   Lab Results  Component Value Date   NA 141 06/01/2019   K 4.1 06/01/2019   CREATININE 0.86 06/01/2019   GFRNONAA 66 06/01/2019   GFRAA 76 06/01/2019   GLUCOSE 145 (H) 06/01/2019     The 10-year ASCVD risk score Mikey Bussing DC Jr., et al., 2013) is: 22.3%   Follow up for grief:  The patient was last seen for this 3 months ago. Changes made at last visit include no changes.   She reports good compliance with treatment. She feels that condition is Unchanged. She is not having side effects. However, patient has lost 2 more pounds since last visit.   Wt Readings from Last 3 Encounters:  09/25/19 122 lb 3.2 oz (55.4 kg)    06/25/19 124 lb 9.6 oz (56.5 kg)  06/01/19 126 lb 3.2 oz (57.2 kg)    Prediabetes, Follow-up  Lab Results  Component Value Date   HGBA1C 5.7 (A) 06/25/2019   HGBA1C 5.9 (A) 02/23/2019   HGBA1C 6.0 (H) 08/30/2018   GLUCOSE 145 (H) 06/01/2019   GLUCOSE 119 (H) 08/30/2018   GLUCOSE 121 (H) 05/03/2018    Last seen for for this3 months ago.  Management since that visit includes no changes. Current symptoms include none and have been stable.  Prior visit with dietician: no Current diet: on average, 2 meals per day Current exercise: no regular exercise  Pertinent Labs:    Component Value Date/Time   CHOL 185 06/01/2019 1500   TRIG 73 06/01/2019 1500   CHOLHDL 4.2 06/01/2019 1500   CREATININE 0.86 06/01/2019 1500    She is here with her neighbor today and they report patient has been increasingly forgetful for several months, worsening since the patient's husband passed away. Her neighbor also reports that patient has been off balance and has had a couple of falls. She is also eating poorly which he related to chronic mouth pain. Has started having to use a cain.     Medications: Outpatient Medications Prior to Visit  Medication Sig  . acetaminophen (TYLENOL) 500 MG tablet  Take 500 mg by mouth every 8 (eight) hours as needed.   Marland Kitchen alendronate (FOSAMAX) 70 MG tablet TAKE 1 TABLET BY MOUTH EVERY 7 DAYS. TAKE WITH A FULL GLASS OF WATER ON EMPTY STOMACH  . amLODipine (NORVASC) 10 MG tablet Take 1 tablet (10 mg total) by mouth every evening.  Marland Kitchen aspirin 81 MG tablet Take 1 tablet by mouth daily.  . Calcium Carbonate-Vitamin D (CALCIUM 600/VITAMIN D PO) Take 1 tablet by mouth daily.   . diazepam (VALIUM) 10 MG tablet TAKE 1/2 (ONE-HALF) TO 1 TABLET BY MOUTH 4 TIMES DAILY AS NEEDED FOR ANXIETY  . Fiber POWD Take 2 scoop by mouth daily.  Marland Kitchen FLUoxetine (PROZAC) 20 MG capsule Take 1 capsule (20 mg total) by mouth daily.  . hydrochlorothiazide (HYDRODIURIL) 25 MG tablet Take 1 tablet by  mouth once daily (Patient taking differently: As needed for fluid retention)  . latanoprost (XALATAN) 0.005 % ophthalmic solution Place 1 drop into both eyes at bedtime.  . lidocaine (XYLOCAINE) 2 % solution USE AS DIRECTED 15ML IN MOUTH OR THROAT EVERY 4-6 HOURS AS NEEDED FOR MOUTH PAIN  . lisinopril (ZESTRIL) 40 MG tablet Take 1 tablet by mouth once daily  . Multiple Vitamins-Minerals (MULTIVITAMIN ADULT PO) Take 1 tablet by mouth daily.  . Omega-3 Fatty Acids (FISH OIL) 1000 MG CAPS Take 1 capsule by mouth daily.   Marland Kitchen omeprazole (PRILOSEC) 20 MG capsule Take 1 capsule by mouth once daily  . Timolol Maleate 0.5 % (DAILY) SOLN Place 1 drop into both eyes at bedtime.  . valACYclovir (VALTREX) 1000 MG tablet TAKE 2 TABLETS BY MOUTH TWICE DAILY FOR 1 DAY AS NEEDED FOR COLD SORES   No facility-administered medications prior to visit.    Review of Systems  Constitutional: Negative.   Respiratory: Negative.   Cardiovascular: Negative.   Gastrointestinal: Negative.   Musculoskeletal: Negative.   Neurological: Negative.   Psychiatric/Behavioral: The patient is nervous/anxious.       Objective    BP 126/78   Pulse 71   Temp 98.9 F (37.2 C)   Ht 5\' 6"  (1.676 m)   Wt 122 lb 3.2 oz (55.4 kg)   BMI 19.72 kg/m    Physical Exam   General: Appearance:    Thin female in no acute distress  Eyes:    PERRL, conjunctiva/corneas clear, EOM's intact       Lungs:     Clear to auscultation bilaterally, respirations unlabored  Heart:    Normal heart rate. Normal rhythm. No murmurs, rubs, or gallops.   MS:   All extremities are intact.   Neurologic:   Awake, alert, oriented x 3. Unsteady gain frequently falling to the right. Unable to perform Romberg. Requires cane to walk more that few steps.        Results for orders placed or performed in visit on 09/25/19  POCT glycosylated hemoglobin (Hb A1C)  Result Value Ref Range   Hemoglobin A1C 5.6 4.0 - 5.6 %   HbA1c POC (<> result, manual entry)      HbA1c, POC (prediabetic range)     HbA1c, POC (controlled diabetic range)      Assessment & Plan     1. Ataxia Has had multiple falls with some stuttering and issues with confusion and forgetfulness. - CT HEAD W & WO CONTRAST; Future  2. Essential (primary) hypertension Well controlled.  Continue current medications.    3. Grief Stable, continue fluoxetine.   4. Anxiety Patient was counseled or potential  adverse effects with include gait disturbance and forgetfulness, but she is extremely anxious and feels she would not be able to function without it. She is take a full tablet four times a day and suspect she could cut back to 1/2 tablet doses during the day.  - diazepam (VALIUM) 10 MG tablet; TAKE 1/2 (ONE-HALF) TO 1 TABLET BY MOUTH 4 TIMES DAILY AS NEEDED FOR ANXIETY  Dispense: 120 tablet; Refill: 5  5. Pre-diabetes A1c is down, this is likely related to difficulty eating from chronic dental pain which has been extensively worked up by ENT  6. Chronic dental pain refill- lidocaine (XYLOCAINE) 2 % solution; USE AS DIRECTED 15ML IN MOUTH OR THROAT EVERY 4-6 HOURS AS NEEDED FOR MOUTH PAIN  Dispense: 400 mL; Refill: 5  7. Need for influenza vaccination  - Flu Vaccine QUAD High Dose(Fluad)         The entirety of the information documented in the History of Present Illness, Review of Systems and Physical Exam were personally obtained by me. Portions of this information were initially documented by the CMA and reviewed by me for thoroughness and accuracy.      Joan Huh, MD  [facility] [phone] (phone) [phone] (fax)  [place]

## 2019-09-26 ENCOUNTER — Telehealth: Payer: Self-pay

## 2019-09-26 DIAGNOSIS — H2512 Age-related nuclear cataract, left eye: Secondary | ICD-10-CM | POA: Diagnosis not present

## 2019-09-26 NOTE — Telephone Encounter (Signed)
Form from [facility] being sent back for approval for surgery

## 2019-09-26 NOTE — Telephone Encounter (Signed)
Copied from [place] [phone]. Topic: General - Inquiry >> Sep 25, 2019  4:52 PM Alease Frame wrote: Reason for CRM: Pt is needing a call back from office regarding medication that was wasn't prescribed . Pt does not know name of medication

## 2019-09-28 NOTE — Telephone Encounter (Signed)
Tried calling pt back. No answer and no vm. 

## 2019-10-01 ENCOUNTER — Telehealth: Payer: Self-pay

## 2019-10-01 ENCOUNTER — Telehealth: Payer: Self-pay | Admitting: Family Medicine

## 2019-10-01 DIAGNOSIS — I10 Essential (primary) hypertension: Secondary | ICD-10-CM | POA: Diagnosis not present

## 2019-10-01 NOTE — Telephone Encounter (Signed)
Please advise patient she needs creatinine checked before they can do the CT scan tomorrow. Needs to come by our lab this afternoon in order to have results by tomorrow morning.

## 2019-10-01 NOTE — Telephone Encounter (Signed)
I think I signes off on this last week. Please check with medical records

## 2019-10-01 NOTE — Telephone Encounter (Signed)
Copied from [place] [phone]. Topic: General - Call Back - No Documentation >> Oct 01, 2019  3:49 PM Erick Blinks wrote: Reason for CRM: Pt has questions for clinic, pt wants a call back from nurse or [place] contact: [phone]

## 2019-10-01 NOTE — Telephone Encounter (Signed)
Dr. Caryn Section have you seen this form?

## 2019-10-01 NOTE — Telephone Encounter (Signed)
Patient advised and agrees to have lab test done today. Patient advised of lab hours.

## 2019-10-02 ENCOUNTER — Telehealth: Payer: Self-pay

## 2019-10-02 ENCOUNTER — Ambulatory Visit
Admission: RE | Admit: 2019-10-02 | Discharge: 2019-10-02 | Disposition: A | Discharge status: Home / Self Care | Payer: Medicare Other | Source: Ambulatory Visit | Attending: Family Medicine | Admitting: Family Medicine

## 2019-10-02 ENCOUNTER — Other Ambulatory Visit: Payer: Self-pay

## 2019-10-02 ENCOUNTER — Encounter: Payer: Self-pay | Admitting: Ophthalmology

## 2019-10-02 DIAGNOSIS — I6782 Cerebral ischemia: Secondary | ICD-10-CM | POA: Diagnosis not present

## 2019-10-02 DIAGNOSIS — G319 Degenerative disease of nervous system, unspecified: Secondary | ICD-10-CM | POA: Diagnosis not present

## 2019-10-02 DIAGNOSIS — I7090 Unspecified atherosclerosis: Secondary | ICD-10-CM | POA: Diagnosis not present

## 2019-10-02 DIAGNOSIS — R270 Ataxia, unspecified: Secondary | ICD-10-CM | POA: Insufficient documentation

## 2019-10-02 LAB — CREATININE, SERUM
Creatinine, Ser: 0.83 mg/dL (ref 0.57–1.00)
GFR calc Af Amer: 79 mL/min/{1.73_m2} (ref >59)
GFR calc non Af Amer: 69 mL/min/{1.73_m2} (ref >59)

## 2019-10-02 MED ORDER — IOHEXOL 300 MG/ML  SOLN
75.0000 mL | Freq: Once | INTRAMUSCULAR | Status: AC | PRN
  Administered 2019-10-02: 75 mL via INTRAVENOUS

## 2019-10-02 NOTE — Telephone Encounter (Signed)
-----   Message from Birdie Sons, MD sent at 10/02/2019  2:19 PM EDT ----- Head CT is normal. No sign of stroke. Recommend referral to neurology for further evaluation of gait and balance abnormality.

## 2019-10-02 NOTE — Telephone Encounter (Signed)
Patient advised of results. She states she does not want to go to a neurologist . She says she is fine and tired of being passed back and forth from doctor to doctor. She says she stumbled once and everyone is making a big deal of this.  Patient says she wants to know if she is cleared for cataract surgery. She says her surgery is already scheduled. Patient is requesting that Dr. Caryn Section call her and tell her that she is cleared for surgery. Patient wants a call back tonight.

## 2019-10-03 ENCOUNTER — Telehealth: Payer: Self-pay

## 2019-10-03 NOTE — Telephone Encounter (Unsigned)
Copied from [place]. Topic: General - Other >> Oct 02, 2019  5:17 PM Erick Blinks wrote: Reason for CRM: [phone] Pt needs an urgent call back, she needs surgical clearance and states that they are missing one signature

## 2019-10-03 NOTE — Telephone Encounter (Signed)
Pt states she will see a neurologist after she has cataract surgery. Pt states she will do anything you want her to do. Pt aware Dr Caryn Section is out of the office until Monday, 9/27.

## 2019-10-03 NOTE — Telephone Encounter (Signed)
Copied from [place] [phone]. Topic: General - Inquiry >> Oct 02, 2019  4:51 PM Joan Baldwin wrote: Reason for CRM: Pt is requesting a call back from office . She is not wanting to go to a neurologist . Please advise

## 2019-10-05 ENCOUNTER — Other Ambulatory Visit: Payer: Self-pay

## 2019-10-05 ENCOUNTER — Other Ambulatory Visit
Admission: RE | Admit: 2019-10-05 | Discharge: 2019-10-05 | Disposition: A | Discharge status: Home / Self Care | Payer: Medicare Other | Source: Ambulatory Visit | Attending: Ophthalmology | Admitting: Ophthalmology

## 2019-10-05 DIAGNOSIS — Z01812 Encounter for preprocedural laboratory examination: Secondary | ICD-10-CM | POA: Diagnosis not present

## 2019-10-05 DIAGNOSIS — Z20822 Contact with and (suspected) exposure to covid-19: Secondary | ICD-10-CM | POA: Insufficient documentation

## 2019-10-05 LAB — SARS CORONAVIRUS 2 (TAT 6-24 HRS): SARS Coronavirus 2: NEGATIVE

## 2019-10-05 NOTE — Telephone Encounter (Signed)
Form was faxed to [facility] on 09/28/2019 and again on 10/03/2019. Thanks TNP

## 2019-10-05 NOTE — Telephone Encounter (Signed)
Patient is calling very emotional that she needs pre surgical clearance for procedure on Tuesday. Advised that that Dr.Fisher is out of the office Please advise CB- [phone]

## 2019-10-05 NOTE — Discharge Instructions (Signed)

## 2019-10-08 ENCOUNTER — Telehealth: Payer: Self-pay

## 2019-10-08 ENCOUNTER — Ambulatory Visit: Payer: Medicare Other

## 2019-10-08 NOTE — Telephone Encounter (Signed)
Spoke with patient on the phone she stated her question was to ask Dr. Caryn Section if he could be present tomorrow at her surgery. Patient states that she is having cataract surgery tomorrow and wanted Dr. Caryn Section to be aware of this. I explained to patient that Dr. Caryn Section will be unable to be present the day of her surgery due to patient care. Patient understood and wanted to let Dr. Caryn Section be notified of surgery. KW

## 2019-10-08 NOTE — Telephone Encounter (Signed)
I think this was resent last week wasn't it?

## 2019-10-08 NOTE — Telephone Encounter (Signed)
Check with Helene Kelp. There are multiple phone calls about this from last week. The clearance was first faxed over a week, but apparently the ophthalmologist couldn't find it, so teresa was going to refax it on Friday.

## 2019-10-08 NOTE — Telephone Encounter (Signed)
Copied from [place] [phone]. Topic: General - Other >> Oct 05, 2019  3:26 PM Joan Baldwin wrote: PT is having eye surgery on Tuesday /  she wants to speak with Dr Caryn Section / please advise

## 2019-10-08 NOTE — Telephone Encounter (Signed)
Copied from [place] [phone]. Topic: General - Inquiry >> Oct 08, 2019 12:06 PM Gillis Ends D wrote: Reason for CRM: Patient states that she is having surgery tomorrow and wanted to call and see if Dr. Caryn Section approved of it. She said she is having cataract eye surgery and wants Dr. Caryn Section to call her back at [phone]. Please advise

## 2019-10-08 NOTE — Telephone Encounter (Signed)
Message being responded to in duplicate message.

## 2019-10-09 ENCOUNTER — Ambulatory Visit
Admission: RE | Admit: 2019-10-09 | Discharge: 2019-10-09 | Disposition: A | Discharge status: Home / Self Care | Payer: Medicare Other | Attending: Ophthalmology | Admitting: Ophthalmology

## 2019-10-09 ENCOUNTER — Encounter
Admission: RE | Disposition: A | Discharge status: Home / Self Care | Payer: Self-pay | Source: Home / Self Care | Attending: Ophthalmology

## 2019-10-09 ENCOUNTER — Ambulatory Visit: Payer: Medicare Other | Admitting: Anesthesiology

## 2019-10-09 ENCOUNTER — Encounter: Payer: Self-pay | Admitting: Ophthalmology

## 2019-10-09 ENCOUNTER — Other Ambulatory Visit: Payer: Self-pay

## 2019-10-09 DIAGNOSIS — Z7982 Long term (current) use of aspirin: Secondary | ICD-10-CM | POA: Diagnosis not present

## 2019-10-09 DIAGNOSIS — Z885 Allergy status to narcotic agent status: Secondary | ICD-10-CM | POA: Insufficient documentation

## 2019-10-09 DIAGNOSIS — F419 Anxiety disorder, unspecified: Secondary | ICD-10-CM | POA: Diagnosis not present

## 2019-10-09 DIAGNOSIS — Z888 Allergy status to other drugs, medicaments and biological substances status: Secondary | ICD-10-CM | POA: Insufficient documentation

## 2019-10-09 DIAGNOSIS — M810 Age-related osteoporosis without current pathological fracture: Secondary | ICD-10-CM | POA: Diagnosis not present

## 2019-10-09 DIAGNOSIS — K219 Gastro-esophageal reflux disease without esophagitis: Secondary | ICD-10-CM | POA: Diagnosis not present

## 2019-10-09 DIAGNOSIS — Z7983 Long term (current) use of bisphosphonates: Secondary | ICD-10-CM | POA: Diagnosis not present

## 2019-10-09 DIAGNOSIS — I10 Essential (primary) hypertension: Secondary | ICD-10-CM | POA: Diagnosis not present

## 2019-10-09 DIAGNOSIS — Z79899 Other long term (current) drug therapy: Secondary | ICD-10-CM | POA: Diagnosis not present

## 2019-10-09 DIAGNOSIS — F329 Major depressive disorder, single episode, unspecified: Secondary | ICD-10-CM | POA: Diagnosis not present

## 2019-10-09 DIAGNOSIS — H409 Unspecified glaucoma: Secondary | ICD-10-CM | POA: Diagnosis not present

## 2019-10-09 DIAGNOSIS — H2512 Age-related nuclear cataract, left eye: Secondary | ICD-10-CM | POA: Diagnosis not present

## 2019-10-09 DIAGNOSIS — Z87891 Personal history of nicotine dependence: Secondary | ICD-10-CM | POA: Diagnosis not present

## 2019-10-09 DIAGNOSIS — H25812 Combined forms of age-related cataract, left eye: Secondary | ICD-10-CM | POA: Diagnosis not present

## 2019-10-09 HISTORY — PX: CATARACT EXTRACTION W/PHACO: SHX586

## 2019-10-09 HISTORY — DX: Depression, unspecified: F32.A

## 2019-10-09 HISTORY — DX: Presence of dental prosthetic device (complete) (partial): Z97.2

## 2019-10-09 HISTORY — DX: Other complications of anesthesia, initial encounter: T88.59XA

## 2019-10-09 HISTORY — DX: Inflammatory liver disease, unspecified: K75.9

## 2019-10-09 HISTORY — DX: Other abnormalities of gait and mobility: R26.89

## 2019-10-09 SURGERY — PHACOEMULSIFICATION, CATARACT, WITH IOL INSERTION
Anesthesia: Monitor Anesthesia Care | Site: Eye | Laterality: Left

## 2019-10-09 MED ORDER — ARMC OPHTHALMIC DILATING DROPS
1.0000 "application " | OPHTHALMIC | Status: DC | PRN
  Administered 2019-10-09 (×3): 1 via OPHTHALMIC

## 2019-10-09 MED ORDER — LIDOCAINE HCL (PF) 2 % IJ SOLN
INTRAOCULAR | Status: DC | PRN
  Administered 2019-10-09: 1 mL

## 2019-10-09 MED ORDER — MOXIFLOXACIN HCL 0.5 % OP SOLN
OPHTHALMIC | Status: DC | PRN
  Administered 2019-10-09: 0.2 mL via OPHTHALMIC

## 2019-10-09 MED ORDER — LACTATED RINGERS IV SOLN: INTRAVENOUS | Status: DC

## 2019-10-09 MED ORDER — FENTANYL CITRATE (PF) 100 MCG/2ML IJ SOLN
INTRAMUSCULAR | Status: DC | PRN
Start: 2019-10-09 — End: 2019-10-09
  Administered 2019-10-09 (×2): 25 ug via INTRAVENOUS

## 2019-10-09 MED ORDER — TETRACAINE HCL 0.5 % OP SOLN
OPHTHALMIC | Status: DC | PRN
  Administered 2019-10-09: 2 [drp] via OPHTHALMIC

## 2019-10-09 MED ORDER — MIDAZOLAM HCL 2 MG/2ML IJ SOLN
INTRAMUSCULAR | Status: DC | PRN
  Administered 2019-10-09: .5 mg via INTRAVENOUS

## 2019-10-09 MED ORDER — EPINEPHRINE PF 1 MG/ML IJ SOLN
INTRAOCULAR | Status: DC | PRN
  Administered 2019-10-09: 58 mL via OPHTHALMIC

## 2019-10-09 MED ORDER — NA CHONDROIT SULF-NA HYALURON 40-17 MG/ML IO SOLN
INTRAOCULAR | Status: DC | PRN
  Administered 2019-10-09: 1 mL via INTRAOCULAR

## 2019-10-09 MED ORDER — TETRACAINE HCL 0.5 % OP SOLN
1.0000 [drp] | OPHTHALMIC | Status: DC | PRN
  Administered 2019-10-09 (×3): 1 [drp] via OPHTHALMIC

## 2019-10-09 MED ORDER — BRIMONIDINE TARTRATE-TIMOLOL 0.2-0.5 % OP SOLN
OPHTHALMIC | Status: DC | PRN
  Administered 2019-10-09: 1 [drp] via OPHTHALMIC

## 2019-10-09 SURGICAL SUPPLY — 21 items
CANNULA ANT/CHMB 27G (MISCELLANEOUS) ×2 IMPLANT
CANNULA ANT/CHMB 27GA (MISCELLANEOUS) ×4 IMPLANT
GLOVE SURG LX 8.0 MICRO (GLOVE) ×2
GLOVE SURG LX STRL 8.0 MICRO (GLOVE) ×1 IMPLANT
GLOVE SURG TRIUMPH 8.0 PF LTX (GLOVE) ×2 IMPLANT
GOWN STRL REUS W/ TWL LRG LVL3 (GOWN DISPOSABLE) ×2 IMPLANT
GOWN STRL REUS W/TWL LRG LVL3 (GOWN DISPOSABLE) ×4
LENS IOL TECNIS EYHANCE 19.5 ×1 IMPLANT
MARKER SKIN DUAL TIP RULER LAB (MISCELLANEOUS) ×2 IMPLANT
NDL FILTER BLUNT 18X1 1/2 (NEEDLE) ×1 IMPLANT
NEEDLE FILTER BLUNT 18X 1/2SAF (NEEDLE) ×1
NEEDLE FILTER BLUNT 18X1 1/2 (NEEDLE) ×1 IMPLANT
PACK EYE AFTER SURG (MISCELLANEOUS) ×2 IMPLANT
PACK OPTHALMIC (MISCELLANEOUS) ×2 IMPLANT
PACK PORFILIO (MISCELLANEOUS) ×2 IMPLANT
SUT ETHILON 10-0 CS-B-6CS-B-6 (SUTURE)
SUTURE EHLN 10-0 CS-B-6CS-B-6 (SUTURE) IMPLANT
SYR 3ML LL SCALE MARK (SYRINGE) ×2 IMPLANT
SYR TB 1ML LUER SLIP (SYRINGE) ×2 IMPLANT
WATER STERILE IRR 250ML POUR (IV SOLUTION) ×2 IMPLANT
WIPE NON LINTING 3.25X3.25 (MISCELLANEOUS) ×2 IMPLANT

## 2019-10-09 NOTE — Anesthesia Preprocedure Evaluation (Signed)
Anesthesia Evaluation  Patient identified by MRN, date of birth, ID band  History of Anesthesia Complications (+) history of anesthetic complications (ponv)  Airway Mallampati: II  TM Distance: >3 FB Neck ROM: Full    Dental   Pulmonary former smoker,    Pulmonary exam normal        Cardiovascular hypertension, Normal cardiovascular exam     Neuro/Psych PSYCHIATRIC DISORDERS Anxiety Depression    GI/Hepatic GERD  ,(+) Hepatitis -, C  Endo/Other    Renal/GU      Musculoskeletal   Abdominal   Peds  Hematology   Anesthesia Other Findings   Reproductive/Obstetrics                             Anesthesia Physical Anesthesia Plan  ASA: III  Anesthesia Plan: MAC   Post-op Pain Management:    Induction:   PONV Risk Score and Plan:   Airway Management Planned: Nasal Cannula  Additional Equipment:   Intra-op Plan:   Post-operative Plan:   Informed Consent: I have reviewed the patients History and Physical, chart, labs and discussed the procedure including the risks, benefits and alternatives for the proposed anesthesia with the patient or authorized representative who has indicated his/her understanding and acceptance.       Plan Discussed with: CRNA, Anesthesiologist and Surgeon  Anesthesia Plan Comments:         Anesthesia Quick Evaluation

## 2019-10-09 NOTE — H&P (Signed)
[facility]   Primary Care Physician:  Birdie Sons, MD Ophthalmologist: Dr. George Ina  Pre-Procedure History & Physical: HPI:  Joan Baldwin is a 74 y.o. female here for cataract surgery.   Past Medical History:  Diagnosis Date  . Anxiety    can cause palpitations  . Balance problems    had brain scan (10/02/19) / clearance given by Dr Caryn Section  . Cataract   . Complication of anesthesia    patient state, "they have to give me alot to go out" during colonoscopy  . Depression   . GERD (gastroesophageal reflux disease)   . Glaucoma   . Hepatitis    B w treatment/ been cleared (Dr Maralyn Sago records)  . History of chicken pox   . Hypertension    controlled on meds  . Osteoporosis   . Sepsis ([place]) 01/17/2016  . Wears dentures    upper    Past Surgical History:  Procedure Laterality Date  . ABDOMINAL HYSTERECTOMY     vaginal. Still has both ovaries  . back surgeries     x 2  . COLONOSCOPY  2007   done in [place] per patient. Normal except for moderate hemorrhoids  . MOUTH SURGERY  2011  . STOMACH SURGERY     stapled for obesity/later reversed  . Tailbone surgery     removal of coccyx    Prior to Admission medications   Medication Sig Start Date End Date Taking? Authorizing Provider  acetaminophen (TYLENOL) 500 MG tablet Take 500 mg by mouth every 8 (eight) hours as needed.    Yes [provider]  alendronate (FOSAMAX) 70 MG tablet TAKE 1 TABLET BY MOUTH EVERY 7 DAYS. TAKE WITH A FULL GLASS OF WATER ON EMPTY STOMACH 11/11/18  Yes Birdie Sons, MD  amLODipine (NORVASC) 10 MG tablet Take 1 tablet (10 mg total) by mouth every evening. 02/23/19  Yes Birdie Sons, MD  aspirin 81 MG tablet Take 1 tablet by mouth daily.   Yes [provider]  Calcium Carbonate-Vitamin D (CALCIUM 600/VITAMIN D PO) Take 1 tablet by mouth daily.    Yes [provider]  diazepam (VALIUM) 10 MG tablet TAKE 1/2 (ONE-HALF) TO 1 TABLET BY MOUTH 4 TIMES  DAILY AS NEEDED FOR ANXIETY 09/25/19  Yes Birdie Sons, MD  Fiber POWD Take 2 scoop by mouth daily.   Yes [provider]  FLUoxetine (PROZAC) 20 MG capsule Take 1 capsule (20 mg total) by mouth daily. 07/25/19  Yes Birdie Sons, MD  hydrochlorothiazide (HYDRODIURIL) 25 MG tablet Take 1 tablet by mouth once daily Patient taking differently: As needed for fluid retention 03/26/19  Yes Fisher, Kirstie Peri, MD  latanoprost (XALATAN) 0.005 % ophthalmic solution Place 1 drop into both eyes at bedtime.   Yes [provider]  lidocaine (XYLOCAINE) 2 % solution USE AS DIRECTED 15ML IN MOUTH OR THROAT EVERY 4-6 HOURS AS NEEDED FOR MOUTH PAIN 09/25/19  Yes Birdie Sons, MD  lisinopril (ZESTRIL) 40 MG tablet Take 1 tablet by mouth once daily 07/18/19  Yes Fisher, Kirstie Peri, MD  Multiple Vitamins-Minerals (MULTIVITAMIN ADULT PO) Take 1 tablet by mouth daily.   Yes [provider]  Omega-3 Fatty Acids (FISH OIL) 1000 MG CAPS Take 1 capsule by mouth daily.  08/04/09  Yes [provider]  omeprazole (PRILOSEC) 20 MG capsule Take 1 capsule by mouth once daily 01/23/19  Yes Fisher, Kirstie Peri, MD  Timolol Maleate 0.5 % (DAILY)  SOLN Place 1 drop into both eyes at bedtime.   Yes [provider]  valACYclovir (VALTREX) 1000 MG tablet TAKE 2 TABLETS BY MOUTH TWICE DAILY FOR 1 DAY AS NEEDED FOR COLD SORES 06/30/18  Yes Birdie Sons, MD    Allergies as of 09/06/2019 - Review Complete 08/14/2019  Allergen Reaction Noted  . Gabapentin Rash 10/03/2017  . Codeine Itching 11/22/2014  . Fluoxetine  06/29/2019  . Tramadol hcl Other (See Comments) 11/22/2014  . Ultram  [tramadol hcl]  11/22/2014    Family History  Problem Relation Age of Onset  . Emphysema Mother   . Kidney failure Sister   . Heart Problems Brother   . Diabetes Brother   . Skin cancer Brother   . Breast cancer Neg Hx     Social History   Socioeconomic History  . Marital status: Widowed    Spouse  name: Not on file  . Number of children: 3  . Years of education: Not on file  . Highest education level: 12th grade  Occupational History  . Occupation: Retired  Tobacco Use  . Smoking status: Former Smoker    Packs/day: 1.00    Years: 30.00    Pack years: 30.00    Types: Cigarettes    Quit date: 01/11/2001    Years since quitting: 18.7  . Smokeless tobacco: Never Used  Vaping Use  . Vaping Use: Never used  Substance and Sexual Activity  . Alcohol use: No    Alcohol/week: 0.0 standard drinks  . Drug use: No  . Sexual activity: Not on file  Other Topics Concern  . Not on file  Social History Narrative  . Not on file   Social Determinants of Health   Financial Resource Strain: Low Risk   . Difficulty of Paying Living Expenses: Not hard at all  Food Insecurity: No Food Insecurity  . Worried About Charity fundraiser in the Last Year: Never true  . Ran Out of Food in the Last Year: Never true  Transportation Needs: No Transportation Needs  . Lack of Transportation (Medical): No  . Lack of Transportation (Non-Medical): No  Physical Activity: Inactive  . Days of Exercise per Week: 0 days  . Minutes of Exercise per Session: 0 min  Stress: No Stress Concern Present  . Feeling of Stress : Not at all  Social Connections: Socially Isolated  . Frequency of Communication with Friends and Family: More than three times a week  . Frequency of Social Gatherings with Friends and Family: More than three times a week  . Attends Religious Services: Never  . Active Member of Clubs or Organizations: No  . Attends Archivist Meetings: Never  . Marital Status: Widowed  Intimate Partner Violence: Not At Risk  . Fear of Current or Ex-Partner: No  . Emotionally Abused: No  . Physically Abused: No  . Sexually Abused: No    Review of Systems: See HPI, otherwise negative ROS  Physical Exam: BP (!) 141/51   Pulse 63   Temp (!) 97.3 F (36.3 C) (Temporal)   Ht 5\' 6"  (1.676 m)    Wt 54.9 kg   SpO2 98%   BMI 19.53 kg/m  General:   Alert,  pleasant and cooperative in NAD Head:  Normocephalic and atraumatic. Lungs:  Clear to auscultation.    Heart:  Regular rate and rhythm.   Impression/Plan: Joan Baldwin is here for cataract surgery.  Risks, benefits, limitations, and alternatives regarding cataract  surgery have been reviewed with the patient.  Questions have been answered.  All parties agreeable.   Birder Robson, MD  10/09/2019, 11:41 AM

## 2019-10-09 NOTE — Anesthesia Postprocedure Evaluation (Signed)
Anesthesia Post Note  Patient: Joan Baldwin  Procedure(s) Performed: CATARACT EXTRACTION PHACO AND INTRAOCULAR LENS PLACEMENT (IOC) LEFT (Left Eye)     Patient location during evaluation: PACU Anesthesia Type: MAC Level of consciousness: awake and alert Pain management: pain level controlled Vital Signs Assessment: post-procedure vital signs reviewed and stable Respiratory status: spontaneous breathing, nonlabored ventilation, respiratory function stable and patient connected to nasal cannula oxygen Cardiovascular status: stable and blood pressure returned to baseline Postop Assessment: no apparent nausea or vomiting Anesthetic complications: no   No complications documented.  Wanda Plump Lyriq Jarchow

## 2019-10-09 NOTE — Telephone Encounter (Signed)
Form was faxed both on 09/28/2019 and 10/03/2019. Both times the faxed showed it went successfully to [facility]. TNP

## 2019-10-09 NOTE — Transfer of Care (Signed)
Immediate Anesthesia Transfer of Care Note  Patient: Joan Baldwin  Procedure(s) Performed: CATARACT EXTRACTION PHACO AND INTRAOCULAR LENS PLACEMENT (IOC) LEFT (Left Eye)  Patient Location: PACU  Anesthesia Type: MAC  Level of Consciousness: awake, alert  and patient cooperative  Airway and Oxygen Therapy: Patient Spontanous Breathing and Patient connected to supplemental oxygen  Post-op Assessment: Post-op Vital signs reviewed, Patient's Cardiovascular Status Stable, Respiratory Function Stable, Patent Airway and No signs of Nausea or vomiting  Post-op Vital Signs: Reviewed and stable  Complications: No complications documented.

## 2019-10-09 NOTE — Anesthesia Procedure Notes (Signed)
Procedure Name: MAC Performed by: Braylinn Gulden, CRNA Pre-anesthesia Checklist: Patient identified, Emergency Drugs available, Suction available, Timeout performed and Patient being monitored Patient Re-evaluated:Patient Re-evaluated prior to induction Oxygen Delivery Method: Nasal cannula Placement Confirmation: positive ETCO2       

## 2019-10-09 NOTE — Op Note (Signed)
PREOPERATIVE DIAGNOSIS:  Nuclear sclerotic cataract of the left eye.   POSTOPERATIVE DIAGNOSIS:  Nuclear sclerotic cataract of the left eye.   OPERATIVE PROCEDURE:@   SURGEON:  Birder Robson, MD.   ANESTHESIA:  Anesthesiologist: Carlos American, MD CRNA: Mayme Genta, CRNA  1.      Managed anesthesia care. 2.     0.73ml of Shugarcaine was instilled following the paracentesis   COMPLICATIONS:  None.   TECHNIQUE:   Stop and chop   DESCRIPTION OF PROCEDURE:  The patient was examined and consented in the preoperative holding area where the aforementioned topical anesthesia was applied to the left eye and then brought back to the Operating Room where the left eye was prepped and draped in the usual sterile ophthalmic fashion and a lid speculum was placed. A paracentesis was created with the side port blade and the anterior chamber was filled with viscoelastic. A near clear corneal incision was performed with the steel keratome. A continuous curvilinear capsulorrhexis was performed with a cystotome followed by the capsulorrhexis forceps. Hydrodissection and hydrodelineation were carried out with BSS on a blunt cannula. The lens was removed in a stop and chop  technique and the remaining cortical material was removed with the irrigation-aspiration handpiece. The capsular bag was inflated with viscoelastic and the Technis ZCB00 lens was placed in the capsular bag without complication. The remaining viscoelastic was removed from the eye with the irrigation-aspiration handpiece. The wounds were hydrated. The anterior chamber was flushed with BSS and the eye was inflated to physiologic pressure. 0.85ml Vigamox was placed in the anterior chamber. The wounds were found to be water tight. The eye was dressed with Combigan. The patient was given protective glasses to wear throughout the day and a shield with which to sleep tonight. The patient was also given drops with which to begin a drop regimen today  and will follow-up with me in one day. Implant Name Type Inv. Item Serial No. Manufacturer Lot No. LRB No. Used Action  LENS II EYHANCE 19.5 - [id number]  LENS II EYHANCE 19.5 [id number] JOHNSON   Left 1 Implanted    Procedure(s) with comments: CATARACT EXTRACTION PHACO AND INTRAOCULAR LENS PLACEMENT ([facility]) LEFT (Left) - 6.16 0:46.0  Electronically signed: Birder Robson 10/09/2019 12:07 PM

## 2019-10-10 ENCOUNTER — Encounter: Payer: Self-pay | Admitting: Ophthalmology

## 2019-10-18 DIAGNOSIS — H2511 Age-related nuclear cataract, right eye: Secondary | ICD-10-CM | POA: Diagnosis not present

## 2019-10-26 ENCOUNTER — Other Ambulatory Visit
Admission: RE | Admit: 2019-10-26 | Discharge: 2019-10-26 | Disposition: A | Discharge status: Home / Self Care | Payer: Medicare Other | Source: Ambulatory Visit | Attending: Ophthalmology | Admitting: Ophthalmology

## 2019-10-26 ENCOUNTER — Other Ambulatory Visit: Payer: Self-pay

## 2019-10-26 DIAGNOSIS — Z01812 Encounter for preprocedural laboratory examination: Secondary | ICD-10-CM | POA: Insufficient documentation

## 2019-10-26 DIAGNOSIS — Z20822 Contact with and (suspected) exposure to covid-19: Secondary | ICD-10-CM | POA: Diagnosis not present

## 2019-10-26 LAB — SARS CORONAVIRUS 2 (TAT 6-24 HRS): SARS Coronavirus 2: NEGATIVE

## 2019-10-26 NOTE — Discharge Instructions (Signed)

## 2019-10-30 ENCOUNTER — Ambulatory Visit: Payer: Medicare Other | Admitting: Anesthesiology

## 2019-10-30 ENCOUNTER — Ambulatory Visit
Admission: RE | Admit: 2019-10-30 | Discharge: 2019-10-30 | Disposition: A | Discharge status: Home / Self Care | Payer: Medicare Other | Attending: Ophthalmology | Admitting: Ophthalmology

## 2019-10-30 ENCOUNTER — Encounter
Admission: RE | Disposition: A | Discharge status: Home / Self Care | Payer: Self-pay | Source: Home / Self Care | Attending: Ophthalmology

## 2019-10-30 ENCOUNTER — Other Ambulatory Visit: Payer: Self-pay

## 2019-10-30 DIAGNOSIS — Z9842 Cataract extraction status, left eye: Secondary | ICD-10-CM | POA: Diagnosis not present

## 2019-10-30 DIAGNOSIS — K219 Gastro-esophageal reflux disease without esophagitis: Secondary | ICD-10-CM | POA: Insufficient documentation

## 2019-10-30 DIAGNOSIS — Z885 Allergy status to narcotic agent status: Secondary | ICD-10-CM | POA: Insufficient documentation

## 2019-10-30 DIAGNOSIS — Z87891 Personal history of nicotine dependence: Secondary | ICD-10-CM | POA: Insufficient documentation

## 2019-10-30 DIAGNOSIS — H2511 Age-related nuclear cataract, right eye: Secondary | ICD-10-CM | POA: Diagnosis not present

## 2019-10-30 DIAGNOSIS — M810 Age-related osteoporosis without current pathological fracture: Secondary | ICD-10-CM | POA: Insufficient documentation

## 2019-10-30 DIAGNOSIS — F410 Panic disorder [episodic paroxysmal anxiety] without agoraphobia: Secondary | ICD-10-CM | POA: Diagnosis not present

## 2019-10-30 DIAGNOSIS — F32A Depression, unspecified: Secondary | ICD-10-CM | POA: Diagnosis not present

## 2019-10-30 DIAGNOSIS — Z8619 Personal history of other infectious and parasitic diseases: Secondary | ICD-10-CM | POA: Diagnosis not present

## 2019-10-30 DIAGNOSIS — F419 Anxiety disorder, unspecified: Secondary | ICD-10-CM | POA: Diagnosis not present

## 2019-10-30 DIAGNOSIS — Z888 Allergy status to other drugs, medicaments and biological substances status: Secondary | ICD-10-CM | POA: Insufficient documentation

## 2019-10-30 DIAGNOSIS — H25811 Combined forms of age-related cataract, right eye: Secondary | ICD-10-CM | POA: Diagnosis not present

## 2019-10-30 DIAGNOSIS — I10 Essential (primary) hypertension: Secondary | ICD-10-CM | POA: Diagnosis not present

## 2019-10-30 HISTORY — PX: CATARACT EXTRACTION W/PHACO: SHX586

## 2019-10-30 SURGERY — PHACOEMULSIFICATION, CATARACT, WITH IOL INSERTION
Anesthesia: Monitor Anesthesia Care | Site: Eye | Laterality: Right

## 2019-10-30 MED ORDER — NA CHONDROIT SULF-NA HYALURON 40-17 MG/ML IO SOLN
INTRAOCULAR | Status: DC | PRN
  Administered 2019-10-30: 1 mL via INTRAOCULAR

## 2019-10-30 MED ORDER — BRIMONIDINE TARTRATE-TIMOLOL 0.2-0.5 % OP SOLN
OPHTHALMIC | Status: DC | PRN
  Administered 2019-10-30: 1 [drp] via OPHTHALMIC

## 2019-10-30 MED ORDER — TETRACAINE HCL 0.5 % OP SOLN
1.0000 [drp] | OPHTHALMIC | Status: DC | PRN
  Administered 2019-10-30 (×3): 1 [drp] via OPHTHALMIC

## 2019-10-30 MED ORDER — FENTANYL CITRATE (PF) 100 MCG/2ML IJ SOLN
INTRAMUSCULAR | Status: DC | PRN
Start: 2019-10-30 — End: 2019-10-30
  Administered 2019-10-30: 50 ug via INTRAVENOUS
  Administered 2019-10-30 (×2): 25 ug via INTRAVENOUS

## 2019-10-30 MED ORDER — ARMC OPHTHALMIC DILATING DROPS
1.0000 "application " | OPHTHALMIC | Status: DC | PRN
  Administered 2019-10-30 (×3): 1 via OPHTHALMIC

## 2019-10-30 MED ORDER — MOXIFLOXACIN HCL 0.5 % OP SOLN
OPHTHALMIC | Status: DC | PRN
  Administered 2019-10-30: 0.2 mL via OPHTHALMIC

## 2019-10-30 MED ORDER — EPINEPHRINE PF 1 MG/ML IJ SOLN
INTRAOCULAR | Status: DC | PRN
  Administered 2019-10-30: 77 mL via OPHTHALMIC

## 2019-10-30 MED ORDER — LIDOCAINE HCL (PF) 2 % IJ SOLN
INTRAOCULAR | Status: DC | PRN
  Administered 2019-10-30: 1 mL

## 2019-10-30 MED ORDER — MIDAZOLAM HCL 2 MG/2ML IJ SOLN
INTRAMUSCULAR | Status: DC | PRN
  Administered 2019-10-30: .5 mg via INTRAVENOUS
  Administered 2019-10-30: 1 mg via INTRAVENOUS
  Administered 2019-10-30: .5 mg via INTRAVENOUS

## 2019-10-30 SURGICAL SUPPLY — 19 items
CANNULA ANT/CHMB 27G (MISCELLANEOUS) ×2 IMPLANT
CANNULA ANT/CHMB 27GA (MISCELLANEOUS) ×4 IMPLANT
GLOVE SURG LX 8.0 MICRO (GLOVE) ×1
GLOVE SURG LX STRL 8.0 MICRO (GLOVE) ×1 IMPLANT
GLOVE SURG TRIUMPH 8.0 PF LTX (GLOVE) ×2 IMPLANT
GOWN STRL REUS W/ TWL LRG LVL3 (GOWN DISPOSABLE) ×2 IMPLANT
GOWN STRL REUS W/TWL LRG LVL3 (GOWN DISPOSABLE) ×4
LENS IOL TECNIS EYHANCE 19.5 (Intraocular Lens) ×1 IMPLANT
MARKER SKIN DUAL TIP RULER LAB (MISCELLANEOUS) ×2 IMPLANT
NDL FILTER BLUNT 18X1 1/2 (NEEDLE) ×1 IMPLANT
NEEDLE FILTER BLUNT 18X 1/2SAF (NEEDLE) ×1
NEEDLE FILTER BLUNT 18X1 1/2 (NEEDLE) ×1 IMPLANT
PACK EYE AFTER SURG (MISCELLANEOUS) ×2 IMPLANT
PACK OPTHALMIC (MISCELLANEOUS) ×2 IMPLANT
PACK PORFILIO (MISCELLANEOUS) ×2 IMPLANT
SYR 3ML LL SCALE MARK (SYRINGE) ×2 IMPLANT
SYR TB 1ML LUER SLIP (SYRINGE) ×2 IMPLANT
WATER STERILE IRR 250ML POUR (IV SOLUTION) ×2 IMPLANT
WIPE NON LINTING 3.25X3.25 (MISCELLANEOUS) ×2 IMPLANT

## 2019-10-30 NOTE — Op Note (Signed)
PREOPERATIVE DIAGNOSIS:  Nuclear sclerotic cataract of the right eye.   POSTOPERATIVE DIAGNOSIS:  H25.11 Cataract   OPERATIVE PROCEDURE:@   SURGEON:  Birder Robson, MD.   ANESTHESIA:  Anesthesiologist: Ardeth Sportsman, MD CRNA: Mayme Genta, CRNA  1.      Managed anesthesia care. 2.      0.71ml of Shugarcaine was instilled in the eye following the paracentesis.   COMPLICATIONS:  None.   TECHNIQUE:   Stop and chop   DESCRIPTION OF PROCEDURE:  The patient was examined and consented in the preoperative holding area where the aforementioned topical anesthesia was applied to the right eye and then brought back to the Operating Room where the right eye was prepped and draped in the usual sterile ophthalmic fashion and a lid speculum was placed. A paracentesis was created with the side port blade and the anterior chamber was filled with viscoelastic. A near clear corneal incision was performed with the steel keratome. A continuous curvilinear capsulorrhexis was performed with a cystotome followed by the capsulorrhexis forceps. Hydrodissection and hydrodelineation were carried out with BSS on a blunt cannula. The lens was removed in a stop and chop  technique and the remaining cortical material was removed with the irrigation-aspiration handpiece. The capsular bag was inflated with viscoelastic and the Technis ZCB00  lens was placed in the capsular bag without complication. The remaining viscoelastic was removed from the eye with the irrigation-aspiration handpiece. The wounds were hydrated. The anterior chamber was flushed with BSS and the eye was inflated to physiologic pressure. 0.62ml of Vigamox was placed in the anterior chamber. The wounds were found to be water tight. The eye was dressed with Combigan. The patient was given protective glasses to wear throughout the day and a shield with which to sleep tonight. The patient was also given drops with which to begin a drop regimen today and will  follow-up with me in one day. Implant Name Type Inv. Item Serial No. Manufacturer Lot No. LRB No. Used Action  LENS IOL TECNIS EYHANCE 19.5 - [id number] Intraocular Lens LENS IOL TECNIS EYHANCE 19.5 [id number] JOHNSON   Right 1 Implanted   Procedure(s): CATARACT EXTRACTION PHACO AND INTRAOCULAR LENS PLACEMENT ([facility]) RIGHT 6.31 00:46.1 (Right)  Electronically signed: Birder Robson 10/30/2019 11:02 AM

## 2019-10-30 NOTE — H&P (Signed)
[facility]   Primary Care Physician:  Birdie Sons, MD Ophthalmologist: Dr. George Ina  Pre-Procedure History & Physical: HPI:  Joan Baldwin is a 74 y.o. female here for cataract surgery.   Past Medical History:  Diagnosis Date  . Anxiety    can cause palpitations  . Balance problems    had brain scan (10/02/19) / clearance given by Dr Caryn Section  . Cataract   . Complication of anesthesia    patient state, "they have to give me alot to go out" during colonoscopy  . Depression   . GERD (gastroesophageal reflux disease)   . Glaucoma   . Hepatitis    B w treatment/ been cleared (Dr Maralyn Sago records)  . History of chicken pox   . Hypertension    controlled on meds  . Osteoporosis   . Sepsis ([place]) 01/17/2016  . Wears dentures    upper    Past Surgical History:  Procedure Laterality Date  . ABDOMINAL HYSTERECTOMY     vaginal. Still has both ovaries  . back surgeries     x 2  . CATARACT EXTRACTION W/PHACO Left 10/09/2019   Procedure: CATARACT EXTRACTION PHACO AND INTRAOCULAR LENS PLACEMENT ([place]) LEFT;  Surgeon: Birder Robson, MD;  Location: [place];  Service: Ophthalmology;  Laterality: Left;  6.16 0:46.0  . COLONOSCOPY  2007   done in [place] per patient. Normal except for moderate hemorrhoids  . MOUTH SURGERY  2011  . STOMACH SURGERY     stapled for obesity/later reversed  . Tailbone surgery     removal of coccyx    Prior to Admission medications   Medication Sig Start Date End Date Taking? Authorizing Provider  acetaminophen (TYLENOL) 500 MG tablet Take 500 mg by mouth every 8 (eight) hours as needed.    Yes [provider]  alendronate (FOSAMAX) 70 MG tablet TAKE 1 TABLET BY MOUTH EVERY 7 DAYS. TAKE WITH A FULL GLASS OF WATER ON EMPTY STOMACH 11/11/18  Yes Birdie Sons, MD  amLODipine (NORVASC) 10 MG tablet Take 1 tablet (10 mg total) by mouth every evening. 02/23/19  Yes Birdie Sons, MD  aspirin 81 MG tablet Take 1  tablet by mouth daily.   Yes [provider]  Calcium Carbonate-Vitamin D (CALCIUM 600/VITAMIN D PO) Take 1 tablet by mouth daily.    Yes [provider]  diazepam (VALIUM) 10 MG tablet TAKE 1/2 (ONE-HALF) TO 1 TABLET BY MOUTH 4 TIMES DAILY AS NEEDED FOR ANXIETY 09/25/19  Yes Birdie Sons, MD  Fiber POWD Take 2 scoop by mouth daily.   Yes [provider]  FLUoxetine (PROZAC) 20 MG capsule Take 1 capsule (20 mg total) by mouth daily. 07/25/19  Yes Birdie Sons, MD  hydrochlorothiazide (HYDRODIURIL) 25 MG tablet Take 1 tablet by mouth once daily Patient taking differently: As needed for fluid retention 03/26/19  Yes Fisher, Kirstie Peri, MD  latanoprost (XALATAN) 0.005 % ophthalmic solution Place 1 drop into both eyes at bedtime.   Yes [provider]  lidocaine (XYLOCAINE) 2 % solution USE AS DIRECTED 15ML IN MOUTH OR THROAT EVERY 4-6 HOURS AS NEEDED FOR MOUTH PAIN 09/25/19  Yes Birdie Sons, MD  lisinopril (ZESTRIL) 40 MG tablet Take 1 tablet by mouth once daily 07/18/19  Yes Fisher, Kirstie Peri, MD  Multiple Vitamins-Minerals (MULTIVITAMIN ADULT PO) Take 1 tablet by mouth daily.   Yes [provider]  Omega-3 Fatty Acids (FISH OIL) 1000 MG CAPS Take  1 capsule by mouth daily.  08/04/09  Yes [provider]  omeprazole (PRILOSEC) 20 MG capsule Take 1 capsule by mouth once daily 01/23/19  Yes Fisher, Kirstie Peri, MD  Timolol Maleate 0.5 % (DAILY) SOLN Place 1 drop into both eyes at bedtime.   Yes [provider]  valACYclovir (VALTREX) 1000 MG tablet TAKE 2 TABLETS BY MOUTH TWICE DAILY FOR 1 DAY AS NEEDED FOR COLD SORES 06/30/18  Yes Birdie Sons, MD    Allergies as of 10/11/2019 - Review Complete 10/09/2019  Allergen Reaction Noted  . Oxcarbazepine Other (See Comments) 10/02/2019  . Gabapentin Rash 10/03/2017  . Fluoxetine  06/29/2019  . Tramadol hcl Other (See Comments) 11/22/2014  . Ultram  [tramadol hcl]  11/22/2014  . Codeine  Itching and Rash 11/22/2014    Family History  Problem Relation Age of Onset  . Emphysema Mother   . Kidney failure Sister   . Heart Problems Brother   . Diabetes Brother   . Skin cancer Brother   . Breast cancer Neg Hx     Social History   Socioeconomic History  . Marital status: Widowed    Spouse name: Not on file  . Number of children: 3  . Years of education: Not on file  . Highest education level: 12th grade  Occupational History  . Occupation: Retired  Tobacco Use  . Smoking status: Former Smoker    Packs/day: 1.00    Years: 30.00    Pack years: 30.00    Types: Cigarettes    Quit date: 01/11/2001    Years since quitting: 18.8  . Smokeless tobacco: Never Used  Vaping Use  . Vaping Use: Never used  Substance and Sexual Activity  . Alcohol use: No    Alcohol/week: 0.0 standard drinks  . Drug use: No  . Sexual activity: Not on file  Other Topics Concern  . Not on file  Social History Narrative  . Not on file   Social Determinants of Health   Financial Resource Strain: Low Risk   . Difficulty of Paying Living Expenses: Not hard at all  Food Insecurity: No Food Insecurity  . Worried About Charity fundraiser in the Last Year: Never true  . Ran Out of Food in the Last Year: Never true  Transportation Needs: No Transportation Needs  . Lack of Transportation (Medical): No  . Lack of Transportation (Non-Medical): No  Physical Activity: Inactive  . Days of Exercise per Week: 0 days  . Minutes of Exercise per Session: 0 min  Stress: No Stress Concern Present  . Feeling of Stress : Not at all  Social Connections: Socially Isolated  . Frequency of Communication with Friends and Family: More than three times a week  . Frequency of Social Gatherings with Friends and Family: More than three times a week  . Attends Religious Services: Never  . Active Member of Clubs or Organizations: No  . Attends Archivist Meetings: Never  . Marital Status: Widowed   Intimate Partner Violence: Not At Risk  . Fear of Current or Ex-Partner: No  . Emotionally Abused: No  . Physically Abused: No  . Sexually Abused: No    Review of Systems: See HPI, otherwise negative ROS  Physical Exam: BP (!) 113/97   Pulse 70   Temp (!) 97.3 F (36.3 C)   Wt 53.5 kg   SpO2 100%   BMI 19.05 kg/m  General:   Alert,  pleasant and cooperative in  NAD Head:  Normocephalic and atraumatic. Lungs:  Clear to auscultation.    Heart:  Regular rate and rhythm.   Impression/Plan: Joan Baldwin is here for cataract surgery.  Risks, benefits, limitations, and alternatives regarding cataract surgery have been reviewed with the patient.  Questions have been answered.  All parties agreeable.   Birder Robson, MD  10/30/2019, 10:34 AM

## 2019-10-30 NOTE — Anesthesia Preprocedure Evaluation (Addendum)
Anesthesia Evaluation  Patient identified by MRN, date of birth, ID band Patient awake    Reviewed: Allergy & Precautions, NPO status , Patient's Chart, lab work & pertinent test results  History of Anesthesia Complications (+) PONV and history of anesthetic complications (ponv)  Airway Mallampati: II  TM Distance: >3 FB Neck ROM: Full    Dental no notable dental hx.    Pulmonary former smoker,    Pulmonary exam normal        Cardiovascular hypertension, Pt. on medications Normal cardiovascular exam     Neuro/Psych PSYCHIATRIC DISORDERS Anxiety Depression    GI/Hepatic GERD  Controlled,(+) Hepatitis -, C  Endo/Other  negative endocrine ROS  Renal/GU negative Renal ROS     Musculoskeletal negative musculoskeletal ROS (+)   Abdominal Normal abdominal exam  (+)   Peds  Hematology   Anesthesia Other Findings   Reproductive/Obstetrics                            Anesthesia Physical  Anesthesia Plan  ASA: III  Anesthesia Plan: MAC   Post-op Pain Management:    Induction:   PONV Risk Score and Plan: 2 and TIVA, Midazolam and Treatment may vary due to age or medical condition  Airway Management Planned: Nasal Cannula and Natural Airway  Additional Equipment:   Intra-op Plan:   Post-operative Plan:   Informed Consent: I have reviewed the patients History and Physical, chart, labs and discussed the procedure including the risks, benefits and alternatives for the proposed anesthesia with the patient or authorized representative who has indicated his/her understanding and acceptance.     Dental advisory given  Plan Discussed with: CRNA  Anesthesia Plan Comments:         Anesthesia Quick Evaluation

## 2019-10-30 NOTE — Anesthesia Procedure Notes (Signed)
Procedure Name: MAC Date/Time: 10/30/2019 10:50 AM Performed by: Mayme Genta, CRNA Pre-anesthesia Checklist: Patient identified, Emergency Drugs available, Suction available, Timeout performed and Patient being monitored Patient Re-evaluated:Patient Re-evaluated prior to induction Oxygen Delivery Method: Nasal cannula Placement Confirmation: positive ETCO2

## 2019-10-30 NOTE — Transfer of Care (Signed)
Immediate Anesthesia Transfer of Care Note  Patient: Joan Baldwin  Procedure(s) Performed: CATARACT EXTRACTION PHACO AND INTRAOCULAR LENS PLACEMENT ([facility]) RIGHT 6.31 00:46.1 (Right Eye)  Patient Location: PACU  Anesthesia Type: MAC  Level of Consciousness: awake, alert  and patient cooperative  Airway and Oxygen Therapy: Patient Spontanous Breathing and Patient connected to supplemental oxygen  Post-op Assessment: Post-op Vital signs reviewed, Patient's Cardiovascular Status Stable, Respiratory Function Stable, Patent Airway and No signs of Nausea or vomiting  Post-op Vital Signs: Reviewed and stable  Complications: No complications documented.

## 2019-10-30 NOTE — Anesthesia Postprocedure Evaluation (Signed)
Anesthesia Post Note  Patient: Joan Baldwin  Procedure(s) Performed: CATARACT EXTRACTION PHACO AND INTRAOCULAR LENS PLACEMENT ([facility]) RIGHT 6.31 00:46.1 (Right Eye)     Patient location during evaluation: PACU Anesthesia Type: MAC Level of consciousness: awake and alert Pain management: pain level controlled Vital Signs Assessment: post-procedure vital signs reviewed and stable Respiratory status: spontaneous breathing and nonlabored ventilation Cardiovascular status: stable Postop Assessment: no apparent nausea or vomiting Anesthetic complications: no   No complications documented.  Glendene Wyer [employer]

## 2019-10-31 ENCOUNTER — Encounter: Payer: Self-pay | Admitting: Ophthalmology

## 2019-11-23 ENCOUNTER — Emergency Department
Admission: EM | Admit: 2019-11-23 | Discharge: 2019-11-23 | Disposition: A | Discharge status: Home / Self Care | Payer: Medicare Other | Attending: Emergency Medicine | Admitting: Emergency Medicine

## 2019-11-23 ENCOUNTER — Other Ambulatory Visit: Payer: Self-pay

## 2019-11-23 ENCOUNTER — Encounter: Payer: Self-pay | Admitting: Emergency Medicine

## 2019-11-23 ENCOUNTER — Emergency Department: Payer: Medicare Other

## 2019-11-23 DIAGNOSIS — K219 Gastro-esophageal reflux disease without esophagitis: Secondary | ICD-10-CM | POA: Diagnosis not present

## 2019-11-23 DIAGNOSIS — Z7982 Long term (current) use of aspirin: Secondary | ICD-10-CM | POA: Diagnosis not present

## 2019-11-23 DIAGNOSIS — I10 Essential (primary) hypertension: Secondary | ICD-10-CM | POA: Insufficient documentation

## 2019-11-23 DIAGNOSIS — R6889 Other general symptoms and signs: Secondary | ICD-10-CM | POA: Diagnosis not present

## 2019-11-23 DIAGNOSIS — R1084 Generalized abdominal pain: Secondary | ICD-10-CM

## 2019-11-23 DIAGNOSIS — R109 Unspecified abdominal pain: Secondary | ICD-10-CM | POA: Diagnosis not present

## 2019-11-23 DIAGNOSIS — Z87891 Personal history of nicotine dependence: Secondary | ICD-10-CM | POA: Insufficient documentation

## 2019-11-23 DIAGNOSIS — Z743 Need for continuous supervision: Secondary | ICD-10-CM | POA: Diagnosis not present

## 2019-11-23 DIAGNOSIS — R58 Hemorrhage, not elsewhere classified: Secondary | ICD-10-CM | POA: Diagnosis not present

## 2019-11-23 DIAGNOSIS — Z79899 Other long term (current) drug therapy: Secondary | ICD-10-CM | POA: Diagnosis not present

## 2019-11-23 DIAGNOSIS — K5641 Fecal impaction: Secondary | ICD-10-CM | POA: Diagnosis not present

## 2019-11-23 DIAGNOSIS — R1030 Lower abdominal pain, unspecified: Secondary | ICD-10-CM | POA: Diagnosis present

## 2019-11-23 LAB — COMPREHENSIVE METABOLIC PANEL
ALT: 10 U/L (ref 0–44)
AST: 18 U/L (ref 15–41)
Albumin: 3.8 g/dL (ref 3.5–5.0)
Alkaline Phosphatase: 35 U/L — ABNORMAL LOW (ref 38–126)
Anion gap: 10 (ref 5–15)
BUN: 20 mg/dL (ref 8–23)
CO2: 28 mmol/L (ref 22–32)
Calcium: 9.4 mg/dL (ref 8.9–10.3)
Chloride: 101 mmol/L (ref 98–111)
Creatinine, Ser: 0.75 mg/dL (ref 0.44–1.00)
GFR, Estimated: >60 mL/min (ref >60)
Glucose, Bld: 169 mg/dL — ABNORMAL HIGH (ref 70–99)
Potassium: 4.6 mmol/L (ref 3.5–5.1)
Sodium: 139 mmol/L (ref 135–145)
Total Bilirubin: 0.4 mg/dL (ref 0.3–1.2)
Total Protein: 7.2 g/dL (ref 6.5–8.1)

## 2019-11-23 LAB — SAMPLE TO BLOOD BANK

## 2019-11-23 LAB — CBC WITH DIFFERENTIAL/PLATELET
Abs Immature Granulocytes: 0.06 10*3/uL (ref 0.00–0.07)
Basophils Absolute: 0 10*3/uL (ref 0.0–0.1)
Basophils Relative: 0 %
Eosinophils Absolute: 0.1 10*3/uL (ref 0.0–0.5)
Eosinophils Relative: 1 %
HCT: 42.4 % (ref 36.0–46.0)
Hemoglobin: 14.1 g/dL (ref 12.0–15.0)
Immature Granulocytes: 1 %
Lymphocytes Relative: 16 %
Lymphs Abs: 1.6 10*3/uL (ref 0.7–4.0)
MCH: 30.9 pg (ref 26.0–34.0)
MCHC: 33.3 g/dL (ref 30.0–36.0)
MCV: 92.8 fL (ref 80.0–100.0)
Monocytes Absolute: 0.4 10*3/uL (ref 0.1–1.0)
Monocytes Relative: 4 %
Neutro Abs: 8 10*3/uL — ABNORMAL HIGH (ref 1.7–7.7)
Neutrophils Relative %: 78 %
Platelets: 282 10*3/uL (ref 150–400)
RBC: 4.57 MIL/uL (ref 3.87–5.11)
RDW: 12.3 % (ref 11.5–15.5)
WBC: 10.1 10*3/uL (ref 4.0–10.5)
nRBC: 0 % (ref 0.0–0.2)

## 2019-11-23 LAB — PROTIME-INR
INR: 1 (ref 0.8–1.2)
Prothrombin Time: 12.4 seconds (ref 11.4–15.2)

## 2019-11-23 LAB — TYPE AND SCREEN
ABO/RH(D): AB POS
Antibody Screen: NEGATIVE

## 2019-11-23 MED ORDER — MORPHINE SULFATE (PF) 4 MG/ML IV SOLN
4.0000 mg | Freq: Once | INTRAVENOUS | Status: AC
  Administered 2019-11-23: 4 mg via INTRAVENOUS
  Filled 2019-11-23: qty 1

## 2019-11-23 MED ORDER — IOHEXOL 300 MG/ML  SOLN
75.0000 mL | Freq: Once | INTRAMUSCULAR | Status: AC | PRN
  Administered 2019-11-23: 75 mL via INTRAVENOUS

## 2019-11-23 MED ORDER — LACTATED RINGERS IV BOLUS
1000.0000 mL | Freq: Once | INTRAVENOUS | Status: AC
  Administered 2019-11-23: 1000 mL via INTRAVENOUS

## 2019-11-23 MED ORDER — POLYETHYLENE GLYCOL 3350 17 G PO PACK
34.0000 g | PACK | Freq: Every day | ORAL | Status: DC
  Administered 2019-11-23: 34 g via ORAL
  Filled 2019-11-23: qty 2

## 2019-11-23 MED ORDER — FENTANYL CITRATE (PF) 100 MCG/2ML IJ SOLN
50.0000 ug | Freq: Once | INTRAMUSCULAR | Status: AC
  Administered 2019-11-23: 50 ug via INTRAVENOUS
  Filled 2019-11-23: qty 2

## 2019-11-23 MED ORDER — FENTANYL CITRATE (PF) 100 MCG/2ML IJ SOLN
50.0000 ug | INTRAMUSCULAR | Status: DC | PRN
  Administered 2019-11-23 (×2): 50 ug via INTRAVENOUS
  Filled 2019-11-23 (×2): qty 2

## 2019-11-23 NOTE — ED Notes (Signed)
E-sig pad unavailable at this time.  Pt and family verbalized understanding of all discharge instructions and f/u care.

## 2019-11-23 NOTE — ED Notes (Signed)
Pt transported to CT ?

## 2019-11-23 NOTE — Discharge Instructions (Signed)
As we discussed, please use MiraLAX or its generic over-the-counter equivalent over the next few weeks for your symptoms.  You may mix this in your noncarbonated drink of choice such as water, milk or juice.: The next couple days use more aggressive dosing of 2 capfuls of MiraLAX per dose, 1-2 times per day, to help get things moving. After that, and for the next few weeks, I would recommend that she take 1-2 capfuls per day to help stool move smoothly through her rectum so it can string back to normal size.  I would recommend that you increase your fiber intake, either with supplements or with food.  Use Tylenol for pain and fevers.  Up to 1000 mg per dose, up to 4 times per day.  Do not take more than 4000 mg of Tylenol/acetaminophen within 24 hours..  Return to the ED with any fevers or worsening symptoms despite the above regimen.

## 2019-11-23 NOTE — ED Notes (Signed)
Family called and informed that personal belonging were left in the room including soiled gown, robe, and slippers.  Per patient and her family, they would like belongings to be thrown away since they were completely soiled.

## 2019-11-23 NOTE — ED Notes (Signed)
Pt's daughter in law will be in the car, point of contact is [phone] Lyda Jester).

## 2019-11-23 NOTE — ED Notes (Signed)
Pt assisted to the toilet post fleets enema. Pt able to pass large amount of stool at this time.

## 2019-11-23 NOTE — ED Provider Notes (Signed)
[facility] Emergency Department Provider Note ____________________________________________   First MD Initiated Contact with Patient 11/23/19 [phone]     (approximate)  I have reviewed the triage vital signs and the nursing notes.  HISTORY  Chief Complaint Abdominal Pain and Rectal Bleeding   HPI Joan Baldwin is a 74 y.o. femalewho presents to the ED for evaluation of crampy abdominal pain and GI bleeding.  Chart review indicates hx HTN, GERD, glaucoma s/p bilateral lens replacements. ASA 81 as only thinner.  Lives at home alone since the passing of her husband earlier this year.  Ambulatory independently or with a cane.  Denies recent falls or injuries. She reports an abdominal surgical history of having her stomach stapled, and this subsequently reversed due to too much weight loss.  Patient reports being in her typical state of health until developing cramping lower abdominal pain, sensation of frequent need to stool, and hematochezia.  She reports having to run to the bathroom many times to stool, and having difficulty producing a bowel movement, but having passage of dark red blood and clots of blood from her rectum.  She denies any vaginal bleeding, hematuria or dysuria.  She denies any nausea or vomiting, and is reporting intermittent lower abdominal pain is currently 7/10 intensity, aching and nonradiating.  Denies syncope, but reports presyncopal dizziness.  Past Medical History:  Diagnosis Date  . Anxiety    can cause palpitations  . Balance problems    had brain scan (10/02/19) / clearance given by Dr Caryn Section  . Cataract   . Complication of anesthesia    patient state, "they have to give me alot to go out" during colonoscopy  . Depression   . GERD (gastroesophageal reflux disease)   . Glaucoma   . Hepatitis    B w treatment/ been cleared (Dr Maralyn Sago records)  . History of chicken pox   . Hypertension    controlled on meds  . Osteoporosis    . Sepsis ([place]) 01/17/2016  . Wears dentures    upper    Patient Active Problem List   Diagnosis Date Noted  . Chronic hepatitis C without hepatic coma ([place]) 06/01/2019  . Hepatitis C virus infection without hepatic coma 09/03/2018  . Chronic dental pain 08/30/2018  . History of adenomatous polyp of colon 12/29/2016  . History of smoking 30 or more pack years 05/26/2016  . Glaucoma 11/22/2014  . Skin lesion 11/22/2014  . Osteoporosis 02/13/2013  . Pre-diabetes 08/10/2009  . Essential (primary) hypertension 05/06/2006  . Anxiety 01/11/2001  . Insomnia 01/11/2001  . GERD (gastroesophageal reflux disease) 01/11/2001    Past Surgical History:  Procedure Laterality Date  . ABDOMINAL HYSTERECTOMY     vaginal. Still has both ovaries  . back surgeries     x 2  . CATARACT EXTRACTION W/PHACO Left 10/09/2019   Procedure: CATARACT EXTRACTION PHACO AND INTRAOCULAR LENS PLACEMENT ([place]) LEFT;  Surgeon: Birder Robson, MD;  Location: [place];  Service: Ophthalmology;  Laterality: Left;  6.16 0:46.0  . CATARACT EXTRACTION W/PHACO Right 10/30/2019   Procedure: CATARACT EXTRACTION PHACO AND INTRAOCULAR LENS PLACEMENT ([facility]) RIGHT 6.31 00:46.1;  Surgeon: Birder Robson, MD;  Location: [place];  Service: Ophthalmology;  Laterality: Right;  . COLONOSCOPY  2007   done in [place] per patient. Normal except for moderate hemorrhoids  . MOUTH SURGERY  2011  . STOMACH SURGERY     stapled for obesity/later reversed  . Tailbone surgery  removal of coccyx    Prior to Admission medications   Medication Sig Start Date End Date Taking? Authorizing Provider  acetaminophen (TYLENOL) 500 MG tablet Take 500 mg by mouth every 8 (eight) hours as needed.     [provider]  alendronate (FOSAMAX) 70 MG tablet TAKE 1 TABLET BY MOUTH EVERY 7 DAYS. TAKE WITH A FULL GLASS OF WATER ON EMPTY STOMACH 11/11/18   Birdie Sons, MD  amLODipine (NORVASC) 10 MG tablet Take 1  tablet (10 mg total) by mouth every evening. 02/23/19   Birdie Sons, MD  aspirin 81 MG tablet Take 1 tablet by mouth daily.    [provider]  Calcium Carbonate-Vitamin D (CALCIUM 600/VITAMIN D PO) Take 1 tablet by mouth daily.     [provider]  diazepam (VALIUM) 10 MG tablet TAKE 1/2 (ONE-HALF) TO 1 TABLET BY MOUTH 4 TIMES DAILY AS NEEDED FOR ANXIETY 09/25/19   Birdie Sons, MD  Fiber POWD Take 2 scoop by mouth daily.    [provider]  FLUoxetine (PROZAC) 20 MG capsule Take 1 capsule (20 mg total) by mouth daily. 07/25/19   Birdie Sons, MD  hydrochlorothiazide (HYDRODIURIL) 25 MG tablet Take 1 tablet by mouth once daily Patient taking differently: As needed for fluid retention 03/26/19   Birdie Sons, MD  latanoprost (XALATAN) 0.005 % ophthalmic solution Place 1 drop into both eyes at bedtime.    [provider]  lidocaine (XYLOCAINE) 2 % solution USE AS DIRECTED 15ML IN MOUTH OR THROAT EVERY 4-6 HOURS AS NEEDED FOR MOUTH PAIN 09/25/19   Birdie Sons, MD  lisinopril (ZESTRIL) 40 MG tablet Take 1 tablet by mouth once daily 07/18/19   Birdie Sons, MD  Multiple Vitamins-Minerals (MULTIVITAMIN ADULT PO) Take 1 tablet by mouth daily.    [provider]  Omega-3 Fatty Acids (FISH OIL) 1000 MG CAPS Take 1 capsule by mouth daily.  08/04/09   [provider]  omeprazole (PRILOSEC) 20 MG capsule Take 1 capsule by mouth once daily 01/23/19   Birdie Sons, MD  Timolol Maleate 0.5 % (DAILY) SOLN Place 1 drop into both eyes at bedtime.    [provider]  valACYclovir (VALTREX) 1000 MG tablet TAKE 2 TABLETS BY MOUTH TWICE DAILY FOR 1 DAY AS NEEDED FOR COLD SORES 06/30/18   Birdie Sons, MD    Allergies Oxcarbazepine, Gabapentin, Fluoxetine, Tramadol hcl, Ultram  [tramadol hcl], and Codeine  Family History  Problem Relation Age of Onset  . Emphysema Mother   . Kidney failure Sister   . Heart Problems Brother   .  Diabetes Brother   . Skin cancer Brother   . Breast cancer Neg Hx     Social History Social History   Tobacco Use  . Smoking status: Former Smoker    Packs/day: 1.00    Years: 30.00    Pack years: 30.00    Types: Cigarettes    Quit date: 01/11/2001    Years since quitting: 18.8  . Smokeless tobacco: Never Used  Vaping Use  . Vaping Use: Never used  Substance Use Topics  . Alcohol use: No    Alcohol/week: 0.0 standard drinks  . Drug use: No    Review of Systems  Constitutional: No fever/chills Eyes: No visual changes. ENT: No sore throat. Cardiovascular: Denies chest pain. Respiratory: Denies shortness of breath. Gastrointestinal:  No nausea, no vomiting. Positive for abdominal pain, constipation, hematochezia Genitourinary: Negative for dysuria.  Musculoskeletal: Negative for back pain. Skin: Negative for rash. Neurological: Negative for headaches, focal weakness or numbness.  ____________________________________________   PHYSICAL EXAM:  VITAL SIGNS: Vitals:   11/23/19 1155 11/23/19 1353  BP: 123/71 124/80  Pulse: 69 72  Resp: 17 18  Temp:    SpO2: 98% 99%      Constitutional: Alert and oriented.  Uncomfortable-appearing, occasionally exclaiming in lower abdominal pain and clutching her lower abdomen.  In her home pajamas and covered in light brown stool through her legs and groin.. Eyes: Conjunctivae are normal. PERRL. EOMI. Head: Atraumatic. Nose: No congestion/rhinnorhea. Mouth/Throat: Mucous membranes are dry.  Oropharynx non-erythematous. Neck: No stridor. No cervical spine tenderness to palpation. Cardiovascular: Normal rate, regular rhythm. Grossly normal heart sounds.  Good peripheral circulation. Respiratory: Normal respiratory effort.  No retractions. Lungs CTAB. Gastrointestinal: Soft , nondistended. No CVA tenderness. Benign upper quadrants, but significant diffuse lower abdominal tenderness to palpation without peritoneal features. GU:  Chaperoned rectal exam demonstrates small external hemorrhoid that is nonbleeding without thrombosis. Hard stool in the rectum. Light brown stool that is hemoccult positive.  Musculoskeletal: No lower extremity tenderness nor edema.  No joint effusions. No signs of acute trauma. Neurologic:  Normal speech and language. No gross focal neurologic deficits are appreciated. No gait instability noted. Skin:  Skin is warm, dry and intact. No rash noted. Psychiatric: Mood and affect are normal. Speech and behavior are normal.  ____________________________________________   LABS (all labs ordered are listed, but only abnormal results are displayed)  Labs Reviewed  CBC WITH DIFFERENTIAL/PLATELET - Abnormal; Notable for the following components:      Result Value   Neutro Abs 8.0 (*)    All other components within normal limits  COMPREHENSIVE METABOLIC PANEL - Abnormal; Notable for the following components:   Glucose, Bld 169 (*)    Alkaline Phosphatase 35 (*)    All other components within normal limits  PROTIME-INR  URINALYSIS, COMPLETE (UACMP) WITH MICROSCOPIC  TYPE AND SCREEN  SAMPLE TO BLOOD BANK   ____________________________________________  12 Lead EKG   ____________________________________________  RADIOLOGY  ED MD interpretation: CT abdomen/pelvis reviewed by me with large colonic stool burden without evidence of SBO or diverticular disease  Official radiology report(s): CT ABDOMEN PELVIS W CONTRAST  Result Date: 11/23/2019 CLINICAL DATA:  Acute lower abdominal pain. EXAM: CT ABDOMEN AND PELVIS WITH CONTRAST TECHNIQUE: Multidetector CT imaging of the abdomen and pelvis was performed using the standard protocol following bolus administration of intravenous contrast. CONTRAST:  48mL OMNIPAQUE IOHEXOL 300 MG/ML  SOLN COMPARISON:  None. FINDINGS: Lower chest: No acute abnormality. Hepatobiliary: No focal liver abnormality is seen. No gallstones, gallbladder wall thickening, or  biliary dilatation. Pancreas: Unremarkable. No pancreatic ductal dilatation or surrounding inflammatory changes. Spleen: Normal in size without focal abnormality. Adrenals/Urinary Tract: Adrenal glands appear normal. Left renal cyst is noted. No hydronephrosis or renal obstruction is noted. Urinary bladder is unremarkable. Stomach/Bowel: Status post gastric bypass. The appendix appears normal. No small bowel dilatation is noted. There is a large amount of stool seen in the sigmoid colon and rectum concerning for impaction. Vascular/Lymphatic: Aortic atherosclerosis. No enlarged abdominal or pelvic lymph nodes. Reproductive: Status post hysterectomy. No adnexal masses. Other: No abdominal wall hernia or abnormality. No abdominopelvic ascites. Musculoskeletal: Grade 1 anterolisthesis of L4-5 is noted secondary to posterior facet joint hypertrophy. No acute osseous abnormality is noted. IMPRESSION: Large amount of stool is seen in the sigmoid colon and rectum concerning for impaction. Aortic Atherosclerosis (ICD10-I70.0). Electronically Signed  By: Marijo Conception M.D.   On: 11/23/2019 08:46    ____________________________________________   PROCEDURES and INTERVENTIONS  Procedure(s) performed (including Critical Care):  .1-3 Lead EKG Interpretation Performed by: Vladimir Crofts, MD Authorized by: Vladimir Crofts, MD     Interpretation: normal     ECG rate:  86   ECG rate assessment: normal     Rhythm: sinus rhythm     Ectopy: none     Conduction: normal    Fecal disimpaction  Date/Time: 11/23/2019 2:20 PM Performed by: Vladimir Crofts, MD Authorized by: Vladimir Crofts, MD  Local anesthesia used: no  Anesthesia: Local anesthesia used: no  Sedation: Patient sedated: no  Patient tolerance: patient tolerated the procedure well with no immediate complications     Medications  polyethylene glycol (MIRALAX / GLYCOLAX) packet 34 g (34 g Oral Given 11/23/19 1006)  fentaNYL (SUBLIMAZE) injection 50  mcg (50 mcg Intravenous Given 11/23/19 1038)  morphine 4 MG/ML injection 4 mg (4 mg Intravenous Given 11/23/19 0757)  lactated ringers bolus 1,000 mL (0 mLs Intravenous Stopped 11/23/19 0946)  fentaNYL (SUBLIMAZE) injection 50 mcg (50 mcg Intravenous Given 11/23/19 0824)  iohexol (OMNIPAQUE) 300 MG/ML solution 75 mL (75 mLs Intravenous Contrast Given 11/23/19 0829)    ____________________________________________   MDM / ED COURSE   74 year old woman presents from home with lower abdominal pain, found to have fecal impaction requiring manual disimpaction, but medically stable for outpatient management thereafter.  Normal vital signs on room air.  Exam with lower quadrant tenderness to palpation in an otherwise well-appearing patient.  CT imaging does not show diverticular disease, SBO, and only shows evidence of impacted stool.  Blood work is reassuring without derangements.  Manual disimpaction performed by me at the bedside, coupled with enema and p.o. MiraLAX, yields good stool passage and resolution of her symptoms.  Discussed with patient and her daughter-in-law continued MiraLAX usage as an outpatient and following up with her PCP.  We discussed return precautions for the ED.  Patient medically stable for discharge home.   Clinical Course as of Nov 23 1418  Fri Nov 23, 2019  0804 Reassessed.  Patient reports minimal improvement of pain with morphine.  Continued spasms of lower abdominal pain.  Fentanyl ordered   [DS]  7654 Explained plan of care to the nurse for disimpaction   [DS]  1033 Reassessed.  Daughter in law now at the bedside, and I update her on evidence of stool impaction without more serious pathology.  She expresses understanding and steps of the room as I perform manual disimpaction.  Expected discomfort, but this was well-tolerated.  Getting enema and MiraLAX afterwards   [DS]  6503 Reassessed.  Patient reports improving symptoms of pain.  She reports that she is continue  to drink water, but has not had a good bowel movement yet.   [DS]  1305 RN informs me of successful bowel movement.  Patient ready to leave.   [DS]  5465 [id number].  Plan abdominal exam and patient reports feeling relieved discomfort.  I thoroughly discussed with patient and her daughter-in-law management of constipation at home including MiraLAX regimen moving forward   [DS]    Clinical Course User Index [DS] Vladimir Crofts, MD    ____________________________________________   FINAL CLINICAL IMPRESSION(S) / ED DIAGNOSES  Final diagnoses:  Fecal impaction in rectum [facility])  Generalized abdominal pain     ED Discharge Orders    None       Tobey Lippard   Note:  This document was prepared using Dragon voice recognition software and may include unintentional dictation errors.   Vladimir Crofts, MD 11/23/19 [phone]

## 2019-11-23 NOTE — ED Notes (Signed)
ED Provider at bedside. 

## 2019-11-23 NOTE — ED Triage Notes (Addendum)
Pt comes into the ED via [facility] from home c/o generalized abdominal pain and rectal bleeding.  Pt states her stomach is cramping and has waves of pain.  Pt comes into the ED covered in stool that is light in color.  Pt does have rectal irritation present.  Pt in NAD at this time with even and unlabored respirations.  Pt denies any nausea but states she has been lightheaded.

## 2019-11-28 ENCOUNTER — Other Ambulatory Visit: Payer: Self-pay | Admitting: Family Medicine

## 2019-11-28 DIAGNOSIS — F4321 Adjustment disorder with depressed mood: Secondary | ICD-10-CM

## 2019-12-21 ENCOUNTER — Ambulatory Visit: Payer: Medicare Other | Admitting: Family Medicine

## 2019-12-27 ENCOUNTER — Other Ambulatory Visit: Payer: Self-pay | Admitting: Family Medicine

## 2019-12-27 DIAGNOSIS — F4321 Adjustment disorder with depressed mood: Secondary | ICD-10-CM

## 2020-01-04 ENCOUNTER — Other Ambulatory Visit: Payer: Self-pay

## 2020-01-04 ENCOUNTER — Encounter (HOSPITAL_COMMUNITY): Payer: Self-pay

## 2020-01-04 ENCOUNTER — Emergency Department (HOSPITAL_COMMUNITY)
Admission: EM | Admit: 2020-01-04 | Discharge: 2020-01-05 | Disposition: A | Discharge status: Home / Self Care | Payer: Medicare Other | Attending: Emergency Medicine | Admitting: Emergency Medicine

## 2020-01-04 DIAGNOSIS — Z7982 Long term (current) use of aspirin: Secondary | ICD-10-CM | POA: Insufficient documentation

## 2020-01-04 DIAGNOSIS — Z79899 Other long term (current) drug therapy: Secondary | ICD-10-CM | POA: Insufficient documentation

## 2020-01-04 DIAGNOSIS — Z743 Need for continuous supervision: Secondary | ICD-10-CM | POA: Diagnosis not present

## 2020-01-04 DIAGNOSIS — T887XXA Unspecified adverse effect of drug or medicament, initial encounter: Secondary | ICD-10-CM | POA: Diagnosis not present

## 2020-01-04 DIAGNOSIS — T50904A Poisoning by unspecified drugs, medicaments and biological substances, undetermined, initial encounter: Secondary | ICD-10-CM

## 2020-01-04 DIAGNOSIS — T402X1A Poisoning by other opioids, accidental (unintentional), initial encounter: Secondary | ICD-10-CM | POA: Insufficient documentation

## 2020-01-04 DIAGNOSIS — I10 Essential (primary) hypertension: Secondary | ICD-10-CM | POA: Diagnosis not present

## 2020-01-04 DIAGNOSIS — M549 Dorsalgia, unspecified: Secondary | ICD-10-CM | POA: Diagnosis not present

## 2020-01-04 DIAGNOSIS — Z87891 Personal history of nicotine dependence: Secondary | ICD-10-CM | POA: Diagnosis not present

## 2020-01-04 DIAGNOSIS — I959 Hypotension, unspecified: Secondary | ICD-10-CM | POA: Diagnosis not present

## 2020-01-04 NOTE — ED Provider Notes (Signed)
[facility] EMERGENCY DEPARTMENT Provider Note   CSN: [id number] Arrival date & time: 01/04/20  2240     History Chief Complaint  Patient presents with  . Ingestion    Joan Baldwin is a 74 y.o. female.  HPI     This is a 74 year old female with a history of anxiety, depression, hepatitis, hypertension who presents with an overdose.  Patient reports that she was with family this evening and was "toasting" her recently deceased husband.  She describes "toasting" 4 shots of moonshine.  She states that this year has been very hard without him.  She went across the street to a family member who is on hospice.  She then drank oral morphine.  She cannot tell me how much.  She does not know why she drank this.  She denies overt suicidal ideation but when asked why she would drink it otherwise, she states "I just wanted to numb myself."  Denies other illicit drug use.  Per EMS, required 2 doses of Narcan upon their arrival.  She reportedly took approximately 80 mg of morphine.  She states that she feels a little shaky but otherwise does not have any physical complaints.  Past Medical History:  Diagnosis Date  . Anxiety    can cause palpitations  . Balance problems    had brain scan (10/02/19) / clearance given by Dr Caryn Section  . Cataract   . Complication of anesthesia    patient state, "they have to give me alot to go out" during colonoscopy  . Depression   . GERD (gastroesophageal reflux disease)   . Glaucoma   . Hepatitis    B w treatment/ been cleared (Dr Maralyn Sago records)  . History of chicken pox   . Hypertension    controlled on meds  . Osteoporosis   . Sepsis ([place]) 01/17/2016  . Wears dentures    upper    Patient Active Problem List   Diagnosis Date Noted  . Chronic hepatitis C without hepatic coma ([place]) 06/01/2019  . Hepatitis C virus infection without hepatic coma 09/03/2018  . Chronic dental pain 08/30/2018  . History of adenomatous polyp of colon 12/29/2016  .  History of smoking 30 or more pack years 05/26/2016  . Glaucoma 11/22/2014  . Skin lesion 11/22/2014  . Osteoporosis 02/13/2013  . Pre-diabetes 08/10/2009  . Essential (primary) hypertension 05/06/2006  . Anxiety 01/11/2001  . Insomnia 01/11/2001  . GERD (gastroesophageal reflux disease) 01/11/2001    Past Surgical History:  Procedure Laterality Date  . ABDOMINAL HYSTERECTOMY     vaginal. Still has both ovaries  . back surgeries     x 2  . CATARACT EXTRACTION W/PHACO Left 10/09/2019   Procedure: CATARACT EXTRACTION PHACO AND INTRAOCULAR LENS PLACEMENT ([place]) LEFT;  Surgeon: Birder Robson, MD;  Location: [place];  Service: Ophthalmology;  Laterality: Left;  6.16 0:46.0  . CATARACT EXTRACTION W/PHACO Right 10/30/2019   Procedure: CATARACT EXTRACTION PHACO AND INTRAOCULAR LENS PLACEMENT (I[facility]) RIGHT 6.31 00:46.1;  Surgeon: Birder Robson, MD;  Location: [place];  Service: Ophthalmology;  Laterality: Right;  . COLONOSCOPY  2007   done in [place] per patient. Normal except for moderate hemorrhoids  . MOUTH SURGERY  2011  . STOMACH SURGERY     stapled for obesity/later reversed  . Tailbone surgery     removal of coccyx     OB History    Gravida  3   Para  3   Term  Preterm      AB      Living        SAB      IAB      Ectopic      Multiple      Live Births              Family History  Problem Relation Age of Onset  . Emphysema Mother   . Kidney failure Sister   . Heart Problems Brother   . Diabetes Brother   . Skin cancer Brother   . Breast cancer Neg Hx     Social History   Tobacco Use  . Smoking status: Former Smoker    Packs/day: 1.00    Years: 30.00    Pack years: 30.00    Types: Cigarettes    Quit date: 01/11/2001    Years since quitting: 18.9  . Smokeless tobacco: Never Used  Vaping Use  . Vaping Use: Never used  Substance Use Topics  . Alcohol use: Yes    Alcohol/week: 0.0 standard drinks  . Drug  use: No    Home Medications Prior to Admission medications   Medication Sig Start Date End Date Taking? Authorizing Provider  acetaminophen (TYLENOL) 500 MG tablet Take 500 mg by mouth every 8 (eight) hours as needed.     [provider]  alendronate (FOSAMAX) 70 MG tablet TAKE 1 TABLET BY MOUTH EVERY 7 DAYS. TAKE WITH A FULL GLASS OF WATER ON EMPTY STOMACH 11/11/18   Birdie Sons, MD  amLODipine (NORVASC) 10 MG tablet Take 1 tablet (10 mg total) by mouth every evening. 02/23/19   Birdie Sons, MD  aspirin 81 MG tablet Take 1 tablet by mouth daily.    [provider]  Calcium Carbonate-Vitamin D (CALCIUM 600/VITAMIN D PO) Take 1 tablet by mouth daily.     [provider]  diazepam (VALIUM) 10 MG tablet TAKE 1/2 (ONE-HALF) TO 1 TABLET BY MOUTH 4 TIMES DAILY AS NEEDED FOR ANXIETY 09/25/19   Birdie Sons, MD  Fiber POWD Take 2 scoop by mouth daily.    [provider]  FLUoxetine (PROZAC) 20 MG capsule Take 1 capsule by mouth once daily 12/27/19   Birdie Sons, MD  hydrochlorothiazide (HYDRODIURIL) 25 MG tablet Take 1 tablet by mouth once daily Patient taking differently: As needed for fluid retention 03/26/19   Birdie Sons, MD  latanoprost (XALATAN) 0.005 % ophthalmic solution Place 1 drop into both eyes at bedtime.    [provider]  lidocaine (XYLOCAINE) 2 % solution USE AS DIRECTED 15ML IN MOUTH OR THROAT EVERY 4-6 HOURS AS NEEDED FOR MOUTH PAIN 09/25/19   Birdie Sons, MD  lisinopril (ZESTRIL) 40 MG tablet Take 1 tablet by mouth once daily 07/18/19   Birdie Sons, MD  Multiple Vitamins-Minerals (MULTIVITAMIN ADULT PO) Take 1 tablet by mouth daily.    [provider]  Omega-3 Fatty Acids (FISH OIL) 1000 MG CAPS Take 1 capsule by mouth daily.  08/04/09   [provider]  omeprazole (PRILOSEC) 20 MG capsule Take 1 capsule by mouth once daily 01/23/19   Birdie Sons, MD  Timolol Maleate 0.5 % (DAILY) SOLN  Place 1 drop into both eyes at bedtime.    [provider]  valACYclovir (VALTREX) 1000 MG tablet TAKE 2 TABLETS BY MOUTH TWICE DAILY FOR 1 DAY AS NEEDED FOR COLD SORES 06/30/18   Birdie Sons, MD    Allergies  Oxcarbazepine, Gabapentin, Fluoxetine, Tramadol hcl, Ultram  [tramadol hcl], and Codeine  Review of Systems   Review of Systems  Constitutional: Negative for fever.  Respiratory: Negative for shortness of breath.   Cardiovascular: Negative for chest pain.  Gastrointestinal: Negative for abdominal pain, diarrhea, nausea and vomiting.  Genitourinary: Negative for dysuria.  Neurological: Negative for weakness.  Psychiatric/Behavioral: Positive for self-injury. Negative for suicidal ideas.  All other systems reviewed and are negative.   Physical Exam Updated Vital Signs BP 112/60   Pulse 74   Temp (!) 97.5 F (36.4 C)   Resp (!) 5   Ht 1.676 m (5\' 6" )   Wt 53.1 kg   SpO2 (!) 87%   BMI 18.88 kg/m   Physical Exam Vitals and nursing note reviewed.  Constitutional:      Appearance: She is well-developed and well-nourished. She is not ill-appearing.  HENT:     Head: Normocephalic and atraumatic.     Nose: Nose normal.     Mouth/Throat:     Mouth: Mucous membranes are moist.  Eyes:     Pupils: Pupils are equal, round, and reactive to light.     Comments: Pupils 3 mm reactive bilaterally  Cardiovascular:     Rate and Rhythm: Normal rate and regular rhythm.     Heart sounds: Normal heart sounds.  Pulmonary:     Effort: Pulmonary effort is normal. No respiratory distress.     Breath sounds: No wheezing.  Abdominal:     General: Bowel sounds are normal.     Palpations: Abdomen is soft.     Tenderness: There is no abdominal tenderness. There is no guarding or rebound.  Musculoskeletal:     Cervical back: Neck supple.     Right lower leg: No edema.     Left lower leg: No edema.  Skin:    General: Skin is warm and dry.  Neurological:     Mental Status:  She is alert and oriented to person, place, and time.     Comments: Slightly tremulous, oriented x3  Psychiatric:        Mood and Affect: Mood and affect normal.     Comments: Appears intoxicated     ED Results / Procedures / Treatments   Labs (all labs ordered are listed, but only abnormal results are displayed) Labs Reviewed  CBC WITH DIFFERENTIAL/PLATELET - Abnormal; Notable for the following components:      Result Value   WBC 12.4 (*)    RBC 3.69 (*)    Hemoglobin 11.7 (*)    Neutro Abs 9.8 (*)    Abs Immature Granulocytes 0.09 (*)    All other components within normal limits  COMPREHENSIVE METABOLIC PANEL - Abnormal; Notable for the following components:   Sodium 134 (*)    Glucose, Bld 131 (*)    BUN 27 (*)    Alkaline Phosphatase 32 (*)    Total Bilirubin 0.2 (*)    All other components within normal limits  ACETAMINOPHEN LEVEL - Abnormal; Notable for the following components:   Acetaminophen (Tylenol), Serum <10 (*)    All other components within normal limits  SALICYLATE LEVEL - Abnormal; Notable for the following components:   Salicylate Lvl <7.0 (*)    All other components within normal limits  ETHANOL  RAPID URINE DRUG SCREEN, HOSP PERFORMED    EKG EKG Interpretation  Date/Time:  Friday January 04 2020 23:28:54 EST Ventricular Rate:  71 PR Interval:    QRS Duration: 106  QT Interval:  432 QTC Calculation: 470 R Axis:   62 Text Interpretation: Sinus rhythm Supraventricular bigeminy Abnormal R-wave progression, early transition Probable left ventricular hypertrophy Confirmed by Thayer Jew [phone]) on 01/04/2020 11:36:19 PM   Radiology No results found.  Procedures Procedures (including critical care time)  Medications Ordered in ED Medications - No data to display  ED Course  I have reviewed the triage vital signs and the nursing notes.  Pertinent labs & imaging results that were available during my care of the patient were reviewed by me  and considered in my medical decision making (see chart for details).    MDM Rules/Calculators/A&P                          Patient presents after drug overdose.  She allegedly ingested morphine.  She is able to provide history.  Also reports alcohol use.  She denies SI but passively endorses "wanting to numb herself."  Given that she took an unknown amount of morphine which resulted in her receiving Narcan, will need her to be assessed by psychiatry as she clinically appears depressed and endorses significant grief related to her husband's passing.  Lab work reviewed.  Alcohol level negative.  Salicylate and Tylenol levels negative.  No significant metabolic derangements.  EKG without acute ischemia or arrhythmia.  Patient has been hemodynamically monitored.  She has not required any further Narcan.  Patient cleared for TTS evaluation. Final Clinical Impression(s) / ED Diagnoses Final diagnoses:  Drug overdose, undetermined intent, initial encounter    Rx / DC Orders ED Discharge Orders    None       Marcella Dunnaway, Barbette Hair, MD 01/05/20 0145

## 2020-01-04 NOTE — ED Notes (Signed)
Per family member, pt possibly ingested 20 ml of 20 mg/5 ml oral morphine that belongs to a family member who is on Hospice.

## 2020-01-04 NOTE — ED Triage Notes (Signed)
Pt to er room number 16 via ems, per ems pt is visiting family and another family member is on hospice and pt drank her morphine and also drank some moonshine, states that fire gave some internasal narcan.  EMS states that her O2 still wouldn't stay up some they brought her to the er, pt has a 20 in her L ac and has gotten aprox 568ml of NS.    Pt states that she misses her husband who died earlier this year, states that she had 4 shots of moonshine. Pt denies SI.

## 2020-01-04 NOTE — ED Notes (Signed)
Pt states she does not remember taking any medicine at "the party".  Pt states she did drink some moonshine, but typically is not a drinker.  Pt is awake, alert, oriented, nad

## 2020-01-05 LAB — CBC WITH DIFFERENTIAL/PLATELET
Abs Immature Granulocytes: 0.09 10*3/uL — ABNORMAL HIGH (ref 0.00–0.07)
Basophils Absolute: 0 10*3/uL (ref 0.0–0.1)
Basophils Relative: 0 %
Eosinophils Absolute: 0 10*3/uL (ref 0.0–0.5)
Eosinophils Relative: 0 %
HCT: 36.4 % (ref 36.0–46.0)
Hemoglobin: 11.7 g/dL — ABNORMAL LOW (ref 12.0–15.0)
Immature Granulocytes: 1 %
Lymphocytes Relative: 13 %
Lymphs Abs: 1.6 10*3/uL (ref 0.7–4.0)
MCH: 31.7 pg (ref 26.0–34.0)
MCHC: 32.1 g/dL (ref 30.0–36.0)
MCV: 98.6 fL (ref 80.0–100.0)
Monocytes Absolute: 0.8 10*3/uL (ref 0.1–1.0)
Monocytes Relative: 7 %
Neutro Abs: 9.8 10*3/uL — ABNORMAL HIGH (ref 1.7–7.7)
Neutrophils Relative %: 79 %
Platelets: 296 10*3/uL (ref 150–400)
RBC: 3.69 MIL/uL — ABNORMAL LOW (ref 3.87–5.11)
RDW: 13 % (ref 11.5–15.5)
WBC: 12.4 10*3/uL — ABNORMAL HIGH (ref 4.0–10.5)
nRBC: 0 % (ref 0.0–0.2)

## 2020-01-05 LAB — RAPID URINE DRUG SCREEN, HOSP PERFORMED
Amphetamines: NOT DETECTED
Barbiturates: NOT DETECTED
Benzodiazepines: POSITIVE — AB
Cocaine: NOT DETECTED
Opiates: POSITIVE — AB
Tetrahydrocannabinol: NOT DETECTED

## 2020-01-05 LAB — SALICYLATE LEVEL: Salicylate Lvl: <7 mg/dL — ABNORMAL LOW (ref 7.0–30.0)

## 2020-01-05 LAB — COMPREHENSIVE METABOLIC PANEL
ALT: 15 U/L (ref 0–44)
AST: 22 U/L (ref 15–41)
Albumin: 4.2 g/dL (ref 3.5–5.0)
Alkaline Phosphatase: 32 U/L — ABNORMAL LOW (ref 38–126)
Anion gap: 8 (ref 5–15)
BUN: 27 mg/dL — ABNORMAL HIGH (ref 8–23)
CO2: 27 mmol/L (ref 22–32)
Calcium: 9 mg/dL (ref 8.9–10.3)
Chloride: 99 mmol/L (ref 98–111)
Creatinine, Ser: 0.8 mg/dL (ref 0.44–1.00)
GFR, Estimated: >60 mL/min (ref >60)
Glucose, Bld: 131 mg/dL — ABNORMAL HIGH (ref 70–99)
Potassium: 4.9 mmol/L (ref 3.5–5.1)
Sodium: 134 mmol/L — ABNORMAL LOW (ref 135–145)
Total Bilirubin: 0.2 mg/dL — ABNORMAL LOW (ref 0.3–1.2)
Total Protein: 7.7 g/dL (ref 6.5–8.1)

## 2020-01-05 LAB — ETHANOL: Alcohol, Ethyl (B): <10 mg/dL (ref <10)

## 2020-01-05 LAB — ACETAMINOPHEN LEVEL: Acetaminophen (Tylenol), Serum: <10 ug/mL — ABNORMAL LOW (ref 10–30)

## 2020-01-05 NOTE — BH Assessment (Signed)
Comprehensive Clinical Assessment (CCA) Screening, Triage and Referral Note  01/05/2020 Joan Baldwin [id number]   Patient is a 74 year old female with a history of anxiety, depression and chronic pain concerns who presented via EMS to [facility] status post overdose.  Patient was with family for a Christmas celebration yesterday when she joined in on a "moonshine" toast to her late husband, who passed away in 2022-03-13 of this year.  She admits to drinking a total of 4 shots.  The celebration was at the home of a family member who has hospice care.  Patient noticed morphine sitting out on a table and proceeded to ingest an unknown amount.  Per EMS report, she required two doses of narcan on their arrival.  Upon assessment today, patient has no recollection of taking morphine.  She does remember "the toast" and the additional shots of moonshine.  Patient admits to struggling, as this is her first Christmas without her husband.  She denies SI or any history of attempts.  She denies HI, AVH and substance abuse concerns.  Discussed grief counseling options, however patient is a bit resistant and states she has "a lot of support."  Her daughter- in- law, Caroleen Hamman, calls daily and comes by weekly.  She also talks with her brother each night.  She has been involved in her church, however not recently due to the pandemic.    Patient gives verbal consent for LPC to contact her daughter-in-law, [phone]).  Per Caroleen Hamman, patient has been struggling at times with grief and anxiety since her husband died in 03/13/2022.  She calls patient twice per day, however patient calls her often in between calls when she is having a panic attack or struggling with loneliness.  Lonna also visits patient weekly and takes her to appointments.  She shared that patient has had chronic dental pain issues, which was recently determined to be associated with past pain "looping in the brain, not actual pain," psycho-somatic in nature.   She states patient was recently referred to a specialist at [facility] for further evaluation.  She is hoping to hear from this provider soon to schedule.  Caroleen Hamman has no safety concerns, as she has never known patient to have suicidal thoughts outside of "wishing she could be with her deceased husband shortly after he passed away."  She shared that patient has been known to take additional prescribed valium during difficult nights and she has also taken morphine before that was not prescribed to her.  Caroleen Hamman has since been managing patient's medications, and she leaves out only her weekly doses in a pill box.  She states there are no weapons in the home.  Again, she is not concerned for safety, just mostly concerned about ongoing pain issues and patient's difficulty coping with pain.  She has also contacted patient's church to have them reach out to patient.  They have a widow's group that patient may be interested in attending.  Caroleen Hamman is also interested in grief counseling resources.   Disposition: Per Dr. Lucianne Muss, patient does not meet criteria for inpatient treatment.  Grief counseling is recommended.  Patient has been provided with resources in her area. Information included in AVS to be provided to pt upon d/c.    Chief Complaint:  Chief Complaint  Patient presents with  . Ingestion   Visit Diagnosis: Adjustment Disorder with depressed mood(associated with loss)  Patient Reported Information How did you hear about [place]? Other (Comment) (EMS)   Referral name: Patient presented via EMS status post overdose.   Referral phone number: No data recorded Whom do you see for routine medical problems? Other (Comment) Lelon Huh of [facility])   Practice/Facility Name: No data recorded  Practice/Facility Phone Number: No data recorded  Name of Contact: No data recorded  Contact Number: No data recorded  Contact Fax Number: No data recorded  Prescriber Name: No  data recorded  Prescriber Address (if known): No data recorded What Is the Reason for Your Visit/Call Today? No data recorded How Long Has This Been Causing You Problems? 1-6 months  Have You Recently Been in Any Inpatient Treatment (Hospital/Detox/Crisis Center/28-Day Program)? No   Name/Location of Program/Hospital:No data recorded  How Long Were You There? No data recorded  When Were You Discharged? No data recorded Have You Ever Received Services From [facility] Before? No   Who Do You See at [facility]? No data recorded Have You Recently Had Any Thoughts About Hurting Yourself? No   Are You Planning to Commit Suicide/Harm Yourself At This time?  No  Have you Recently Had Thoughts About [place]? No   Explanation: No data recorded Have You Used Any Alcohol or Drugs in the Past 24 Hours? Yes   How Long Ago Did You Use Drugs or Alcohol?  0000 (yesterday afternoon)   What Did You Use and How Much? 3-4 shots of moonshine  What Do You Feel Would Help You the Most Today? Assessment Only  Do You Currently Have a Therapist/Psychiatrist? No   Name of Therapist/Psychiatrist: No data recorded  Have You Been Recently Discharged From Any Office Practice or Programs? No   Explanation of Discharge From Practice/Program:  No data recorded    CCA Screening Triage Referral Assessment Type of Contact: Tele-Assessment   Is this Initial or Reassessment? Initial Assessment   Date Telepsych consult ordered in [facility]:  01/05/2020   Time Telepsych consult ordered in [facility]:  0152  Patient Reported Information Reviewed? Yes   Patient Left Without Being Seen? No data recorded  Reason for Not Completing Assessment: No data recorded Collateral Involvement: Collateral provided by patient's daughter-in-law, Lyda Jester.  Does Patient Have a Stage manager Guardian? No data recorded  Name and Contact of Legal Guardian:  No data recorded If Minor and Not Living with Parent(s), Who  has Custody? No data recorded Is CPS involved or ever been involved? Never  Is APS involved or ever been involved? Never  Patient Determined To Be At Risk for Harm To Self or Others Based on Review of Patient Reported Information or Presenting Complaint? No   Method: No data recorded  Availability of Means: No data recorded  Intent: No data recorded  Notification Required: No data recorded  Additional Information for Danger to Others Potential:  No data recorded  Additional Comments for Danger to Others Potential:  No data recorded  Are There Guns or Other Weapons in Your Home?  No data recorded   Types of Guns/Weapons: No data recorded   Are These Weapons Safely Secured?                              No data recorded   Who Could Verify You Are Able To Have These Secured:    No data recorded Do You Have any Outstanding Charges, Pending Court Dates, Parole/Probation?  No data recorded Contacted To Inform of Risk of Harm To Self or Others: No data recorded Location of Assessment: [facility] ED  Does Patient Present under Involuntary Commitment? No   IVC Papers Initial File Date: No data recorded  [place] of Residence: [place]  Patient Currently Receiving the Following Services: Not Receiving Services   Determination of Need: Routine (7 days)   Options For Referral: Other: Comment (grief counseling)   Fransico Meadow, [facility]

## 2020-01-05 NOTE — ED Notes (Signed)
Pt ambulatory to BR per request to void, one person assist.  Pt is not steady on her feet, instructed to pull cord when finished. Pt agreeable.

## 2020-01-05 NOTE — ED Notes (Signed)
Pt discharged. Call to [facility] who states she will be here in 30 minutes.

## 2020-01-05 NOTE — ED Provider Notes (Signed)
Psychiatry has cleared patient for discharge.  She seems to have grief but is adamant that she is not suicidal and was not suicidal.  Will discharge home.  Discussed return precautions.   Sherwood Gambler, MD 01/05/20 1153

## 2020-01-05 NOTE — ED Notes (Signed)
Pt's DIL Lonna  [phone], can pick up if needed.

## 2020-01-05 NOTE — Discharge Instructions (Addendum)
Outpatient therapy/grief counseling and support is recommended:   [facility] Address: [address] [place], [place] [address] Phone: [phone]  [place] in: [facility] Address: [address] [place] [address], [place], [place] [address] [phone]

## 2020-01-08 ENCOUNTER — Ambulatory Visit (INDEPENDENT_AMBULATORY_CARE_PROVIDER_SITE_OTHER): Payer: Medicare Other | Admitting: Family Medicine

## 2020-01-08 ENCOUNTER — Other Ambulatory Visit: Payer: Self-pay

## 2020-01-08 ENCOUNTER — Encounter: Payer: Self-pay | Admitting: Family Medicine

## 2020-01-08 VITALS — BP 132/71 | HR 85 | Temp 99.1°F | Resp 16 | Wt 124.4 lb

## 2020-01-08 DIAGNOSIS — G4700 Insomnia, unspecified: Secondary | ICD-10-CM

## 2020-01-08 DIAGNOSIS — F419 Anxiety disorder, unspecified: Secondary | ICD-10-CM | POA: Diagnosis not present

## 2020-01-08 MED ORDER — DIAZEPAM 10 MG PO TABS: ORAL_TABLET | ORAL | 5 refills | Status: DC

## 2020-01-08 MED ORDER — TRAZODONE HCL 100 MG PO TABS: 100.0000 mg | ORAL_TABLET | Freq: Every day | ORAL | 2 refills | Status: DC

## 2020-01-08 NOTE — Progress Notes (Signed)
Established patient visit   Patient: Joan Baldwin   DOB: 08-10-1945   74 y.o. Female  MRN: [id number] Visit Date: 01/08/2020  Today's healthcare provider: Lelon Huh, MD   Chief Complaint  Patient presents with  . Follow-up  . Anxiety   Subjective    HPI  Follow up ER visit  Patient was seen in ER for drug overdose with undetermined intent on 01/04/2020. Patient states she took a few shot of liqueur. However she was also reported to have consumed some liquid morphine that was prescribed for a neighbor in hospice. Patient states she does not remember taking morphine. She required Narcan by paramedics. Her daughter-in-law states she thinks that the patient took the morphine on impulse due to her persistent dental pain. She is schedule to see multidisciplinary team at [facility] in February regarding her chronic dental pain. She does state the topical lidocaine helps quite a bit, but still has pain between doses.   -----------------------------------------------------------------------------------------  Anxiety and Grief, Follow-up  She was last seen for anxiety 3 months ago. Management during that visit includes sending in a prescription for Valium. Patient was to take a full tablet four times a day and suspect she could cut back to 1/2 tablet doses during the day. Patient advised to continue Fluoxetine.   Patient's daughter in law feels that the Valium causes her to be more paranoid. Patient currently takes one tablet 4 times daily. She had apparently run out of medication several weeks ago because she had been taking several more doses a day than prescribed. Patient states she feels extremely anxious when she doesn't take it, but her daughter-in-law observed that the patient gets very paranoid when she takes it, and that paranoia went away when she was off of Diazepam.  She feels her anxiety is moderate and Improved since last visit.  Symptoms: No chest pain No difficulty  concentrating  No dizziness Yes fatigue  Yes feelings of losing control Yes insomnia  Yes irritable No palpitations  Yes panic attacks Yes racing thoughts  No shortness of breath No sweating  No tremors/shakes    GAD-7 Results GAD-7 Generalized Anxiety Disorder Screening Tool 01/08/2020  1. Feeling Nervous, Anxious, or on Edge 3  2. Not Being Able to Stop or Control Worrying 3  3. Worrying Too Much About Different Things 3  4. Trouble Relaxing 2  5. Being So Restless it's Hard To Sit Still 0  6. Becoming Easily Annoyed or Irritable 0  7. Feeling Afraid As If Something Awful Might Happen 3  Total GAD-7 Score 14  Difficulty At Work, Home, or Getting  Along With Others? Somewhat difficult    PHQ-9 Scores PHQ9 SCORE ONLY 01/08/2020 08/14/2019 08/30/2018  PHQ-9 Total Score 5 11 1     ---------------------------------------------------------------------------------------------------      Medications: Outpatient Medications Prior to Visit  Medication Sig  . acetaminophen (TYLENOL) 500 MG tablet Take 500 mg by mouth every 8 (eight) hours as needed.   Marland Kitchen alendronate (FOSAMAX) 70 MG tablet TAKE 1 TABLET BY MOUTH EVERY 7 DAYS. TAKE WITH A FULL GLASS OF WATER ON EMPTY STOMACH  . amLODipine (NORVASC) 10 MG tablet Take 1 tablet (10 mg total) by mouth every evening.  Marland Kitchen aspirin 81 MG tablet Take 1 tablet by mouth daily.  . Calcium Carbonate-Vitamin D (CALCIUM 600/VITAMIN D PO) Take 1 tablet by mouth daily.   . diazepam (VALIUM) 10 MG tablet TAKE 1/2 (ONE-HALF) TO 1 TABLET BY MOUTH 4 TIMES DAILY  AS NEEDED FOR ANXIETY  . Fiber POWD Take 2 scoop by mouth daily.  Marland Kitchen FLUoxetine (PROZAC) 20 MG capsule Take 1 capsule by mouth once daily  . hydrochlorothiazide (HYDRODIURIL) 25 MG tablet Take 1 tablet by mouth once daily (Patient taking differently: As needed for fluid retention)  . latanoprost (XALATAN) 0.005 % ophthalmic solution Place 1 drop into both eyes at bedtime.  . lidocaine (XYLOCAINE) 2 %  solution USE AS DIRECTED IN MOUTH OR THROAT EVERY 4-6 HOURS AS NEEDED FOR MOUTH PAIN  . lisinopril (ZESTRIL) 40 MG tablet Take 1 tablet by mouth once daily  . Multiple Vitamins-Minerals (MULTIVITAMIN ADULT PO) Take 1 tablet by mouth daily.  . Omega-3 Fatty Acids (FISH OIL) 1000 MG CAPS Take 1 capsule by mouth daily.   Marland Kitchen omeprazole (PRILOSEC) 20 MG capsule Take 1 capsule by mouth once daily  . Timolol Maleate 0.5 % (DAILY) SOLN Place 1 drop into both eyes at bedtime.  . valACYclovir (VALTREX) 1000 MG tablet TAKE 2 TABLETS BY MOUTH TWICE DAILY FOR 1 DAY AS NEEDED FOR COLD SORES   No facility-administered medications prior to visit.    Review of Systems  Constitutional: Negative for appetite change, chills, fatigue and fever.  Respiratory: Negative for chest tightness and shortness of breath.   Cardiovascular: Negative for chest pain and palpitations.  Gastrointestinal: Negative for abdominal pain, nausea and vomiting.  Neurological: Negative for dizziness and weakness.  Psychiatric/Behavioral: Positive for agitation. The patient is nervous/anxious.       Objective    BP 132/71 (BP Location: Left Arm, Patient Position: Sitting, Cuff Size: Normal)   Pulse 85   Temp 99.1 F (37.3 C) (Oral)   Resp 16   Wt 124 lb 6.4 oz (56.4 kg)   SpO2 97% Comment: room air  BMI 20.08 kg/m    Physical Exam    General: Appearance:    Well developed, well nourished female in no acute distress  Eyes:    PERRL, conjunctiva/corneas clear, EOM's intact       Lungs:     Clear to auscultation bilaterally, respirations unlabored  Heart:    Normal heart rate. Normal rhythm. No murmurs, rubs, or gallops.   MS:   All extremities are intact.   Neurologic:   Awake, alert, oriented x 3. No apparent focal neurological           defect.        Assessment & Plan     1. Anxiety Considerably better on fluoxetine. Patient also feels it's essential she continues diazepam, however her daughter-in-law observed  that she is more irritable and paranoid when she takes. Long discussion about potential dependence and adverse effects of diazepam which can worsen with age. She was advised to limit herself to no more than two tablets a day. Will replace one of her doses with trazodone at bedtime to help sleep.  - diazepam (VALIUM) 10 MG tablet; TAKE 1/2 (ONE-HALF) BY MOUTH 4 TIMES DAILY AS NEEDED FOR ANXIETY; Refill: 5  2. Insomnia, unspecified type try- traZODone (DESYREL) 100 MG tablet; Take 1 tablet (100 mg total) by mouth at bedtime.  Dispense: 30 tablet; Refill: 2   Follow up 3 months.  No follow-ups on file.         Mila Merry, MD  [facility] [phone] (phone) [phone] (fax)  [facility]

## 2020-01-24 ENCOUNTER — Other Ambulatory Visit: Payer: Self-pay | Admitting: Family Medicine

## 2020-01-24 DIAGNOSIS — F4321 Adjustment disorder with depressed mood: Secondary | ICD-10-CM

## 2020-02-07 ENCOUNTER — Other Ambulatory Visit: Payer: Self-pay | Admitting: Family Medicine

## 2020-02-07 DIAGNOSIS — I10 Essential (primary) hypertension: Secondary | ICD-10-CM

## 2020-02-07 NOTE — Telephone Encounter (Signed)
Requested medication (s) are due for refill today: yes  Requested medication (s) are on the active medication list: {yes  Last refill: 03/04/19  #3 ????   0 refills  Future visit scheduled: yes  Notes to clinic: why just 3    Requested Prescriptions  Pending Prescriptions Disp Refills   amLODipine (NORVASC) 10 MG tablet [Pharmacy Med Name: amLODIPine Besylate 10 MG Oral Tablet] 90 tablet 0    Sig: Take 1 tablet by mouth once daily      Cardiovascular:  Calcium Channel Blockers Passed - 02/07/2020  9:50 AM      Passed - Last BP in normal range    BP Readings from Last 1 Encounters:  01/08/20 132/71          Passed - Valid encounter within last 6 months    Recent Outpatient Visits           1 month ago Insomnia, unspecified type   [facility] Birdie Sons, MD   4 months ago [place], Kirstie Peri, MD   7 months ago [place], Donald E, MD   8 months ago [place], Donald E, MD   11 months ago [place], Kirstie Peri, MD       Future Appointments             In 2 months [place], Kirstie Peri, MD [facility], [place]

## 2020-02-25 ENCOUNTER — Other Ambulatory Visit: Payer: Self-pay | Admitting: Family Medicine

## 2020-02-25 DIAGNOSIS — F4321 Adjustment disorder with depressed mood: Secondary | ICD-10-CM

## 2020-03-04 ENCOUNTER — Other Ambulatory Visit: Payer: Self-pay | Admitting: Family Medicine

## 2020-03-04 DIAGNOSIS — I10 Essential (primary) hypertension: Secondary | ICD-10-CM

## 2020-04-10 NOTE — Progress Notes (Signed)
Established patient visit   Patient: Joan Baldwin   DOB: [birth year]   75 y.o. Female  MRN: [id number] Visit Date: 04/11/2020  Today's healthcare provider: Lelon Huh, MD   Chief Complaint  Patient presents with  . Anxiety  . Insomnia  . Prediabetes   Subjective    HPI  Anxiety, Follow-up  She was last seen for anxiety 3 months ago. Changes made at last visit include decrease Diazepam to no more than 2 tablets a day.   She reports excellent compliance with treatment. She reports excellent tolerance of treatment. She is not having side effects.   She feels her anxiety is mild and Improved since last visit. Still has panic attacks.  Symptoms:  No chest pain Yes difficulty concentrating  No dizziness No fatigue  Yes feelings of losing control No insomnia  No irritable No palpitations  Yes panic attacks Yes racing thoughts  Yes shortness of breath No sweating  No tremors/shakes    GAD-7 Results GAD-7 Generalized Anxiety Disorder Screening Tool 01/08/2020  1. Feeling Nervous, Anxious, or on Edge 3  2. Not Being Able to Stop or Control Worrying 3  3. Worrying Too Much About Different Things 3  4. Trouble Relaxing 2  5. Being So Restless it's Hard To Sit Still 0  6. Becoming Easily Annoyed or Irritable 0  7. Feeling Afraid As If Something Awful Might Happen 3  Total GAD-7 Score 14  Difficulty At Work, Home, or Getting  Along With Others? Somewhat difficult    PHQ-9 Scores PHQ9 SCORE ONLY 01/08/2020 08/14/2019 08/30/2018  PHQ-9 Total Score 5 11 1     Follow up for insomnia  The patient was last seen for this 3 months ago. Changes made at last visit include starting Trazodone 100mg  at bedtime.  She reports Excellent compliance with treatment. She takes this drug PRN. She feels that condition is Improved. She is having side effects. Pt feels groggy next  day.  -----------------------------------------------------------------------------------------  Pre-Diabetes Follow-up  Lab Results  Component Value Date   HGBA1C 6.0 (A) 04/11/2020   HGBA1C 5.6 09/25/2019   HGBA1C 5.7 (A) 06/25/2019   Wt Readings from Last 3 Encounters:  04/11/20 138 lb 3.2 oz (62.7 kg)  01/08/20 124 lb 6.4 oz (56.4 kg)  01/04/20 117 lb (53.1 kg)   Last seen for prediabetes 9 months ago.  Management since then includes none. She reports excellent compliance with treatment. She is not having side effects.  Symptoms: No fatigue No foot ulcerations  Yes appetite changes No nausea  No paresthesia of the feet  No polydipsia  No polyuria No visual disturbances   No vomiting     Home blood sugar records: n/a  Episodes of hypoglycemia? No    Current insulin regiment:  Most Recent Eye Exam: Patient has not had diabetic eye exam. Current exercise: walks and trys to get outdoors. Current diet habits: in general, a "healthy" diet    Pertinent Labs: Lab Results  Component Value Date   CHOL 185 06/01/2019   HDL 44 06/01/2019   LDLCALC 127 (H) 06/01/2019   TRIG 73 06/01/2019   CHOLHDL 4.2 06/01/2019   Lab Results  Component Value Date   NA 134 (L) 01/05/2020   K 4.9 01/05/2020   CREATININE 0.80 01/05/2020   GFRNONAA >60 01/05/2020   GFRAA 79 10/01/2019   GLUCOSE 131 (H) 01/05/2020     ---------------------------------------------------------------------------------------------------  Hypertension, follow-up  BP Readings from Last 3 Encounters:  04/11/20 Marland Kitchen)  144/63  01/08/20 132/71  01/05/20 136/70   Wt Readings from Last 3 Encounters:  04/11/20 138 lb 3.2 oz (62.7 kg)  01/08/20 124 lb 6.4 oz (56.4 kg)  01/04/20 117 lb (53.1 kg)     She was last seen for hypertension 9 months ago.  BP at that visit was 126/78. Management since that visit includes none; continue current medications.  She reports excellent compliance with treatment. She is  not having side effects.  She is following a Regular diet. She is not exercising.  She does not smoke.  Use of agents associated with hypertension: NSAIDS.   Outside blood pressures are n/a. Symptoms: No chest pain No chest pressure  No palpitations No syncope  No dyspnea No orthopnea  No paroxysmal nocturnal dyspnea No lower extremity edema   Pertinent labs: Lab Results  Component Value Date   CHOL 185 06/01/2019   HDL 44 06/01/2019   LDLCALC 127 (H) 06/01/2019   TRIG 73 06/01/2019   CHOLHDL 4.2 06/01/2019   Lab Results  Component Value Date   NA 134 (L) 01/05/2020   K 4.9 01/05/2020   CREATININE 0.80 01/05/2020   GFRNONAA >60 01/05/2020   GFRAA 79 10/01/2019   GLUCOSE 131 (H) 01/05/2020     The 10-year ASCVD risk score Mikey Bussing DC Jr., et al., 2013) is: 30.8%   ---------------------------------------------------------------------------------------------------         Medications: Outpatient Medications Prior to Visit  Medication Sig  . acetaminophen (TYLENOL) 500 MG tablet Take 500 mg by mouth every 8 (eight) hours as needed.   Marland Kitchen alendronate (FOSAMAX) 70 MG tablet TAKE 1 TABLET BY MOUTH EVERY 7 DAYS. TAKE WITH A FULL GLASS OF WATER ON EMPTY STOMACH  . amLODipine (NORVASC) 10 MG tablet Take 1 tablet by mouth once daily  . aspirin 81 MG tablet Take 1 tablet by mouth daily.  . Calcium Carbonate-Vitamin D (CALCIUM 600/VITAMIN D PO) Take 1 tablet by mouth daily.   . diazepam (VALIUM) 10 MG tablet TAKE 1/2 (ONE-HALF) BY MOUTH 4 TIMES DAILY AS NEEDED FOR ANXIETY  . Fiber POWD Take 2 scoop by mouth daily.  Marland Kitchen FLUoxetine (PROZAC) 20 MG capsule Take 1 capsule by mouth once daily  . hydrochlorothiazide (HYDRODIURIL) 25 MG tablet Take 1 tablet by mouth once daily (Patient taking differently: As needed for fluid retention)  . latanoprost (XALATAN) 0.005 % ophthalmic solution Place 1 drop into both eyes at bedtime.  . lidocaine (XYLOCAINE) 2 % solution USE AS DIRECTED 15ML IN  MOUTH OR THROAT EVERY 4-6 HOURS AS NEEDED FOR MOUTH PAIN  . lisinopril (ZESTRIL) 40 MG tablet Take 1 tablet by mouth once daily  . Multiple Vitamins-Minerals (MULTIVITAMIN ADULT PO) Take 1 tablet by mouth daily.  . Omega-3 Fatty Acids (FISH OIL) 1000 MG CAPS Take 1 capsule by mouth daily.   Marland Kitchen omeprazole (PRILOSEC) 20 MG capsule Take 1 capsule by mouth once daily  . Timolol Maleate 0.5 % (DAILY) SOLN Place 1 drop into both eyes at bedtime.  . traZODone (DESYREL) 100 MG tablet Take 1 tablet (100 mg total) by mouth at bedtime.  . valACYclovir (VALTREX) 1000 MG tablet TAKE 2 TABLETS BY MOUTH TWICE DAILY FOR 1 DAY AS NEEDED FOR COLD SORES   No facility-administered medications prior to visit.    Review of Systems     Objective    BP (!) 144/63 (BP Location: Right Arm, Patient Position: Sitting, Cuff Size: Normal)   Pulse 72   Ht 5\' 6"  (1.676 m)  Wt 138 lb 3.2 oz (62.7 kg)   SpO2 99%   BMI 22.31 kg/m     Physical Exam   General appearance: Well developed, well nourished female, cooperative and in no acute distress Head: Normocephalic, without obvious abnormality, atraumatic Respiratory: Respirations even and unlabored, normal respiratory rate Extremities: All extremities are intact.  Skin: Skin color, texture, turgor normal. No rashes seen  Psych: Appropriate mood and affect. Neurologic: Mental status: Alert, oriented to person, place, and time, thought content appropriate.   Results for orders placed or performed in visit on 04/11/20  POCT HgB A1C  Result Value Ref Range   Hemoglobin A1C 6.0 (A) 4.0 - 5.6 %   Estimated Average Glucose 126     Assessment & Plan     1. Anxiety refill diazepam (VALIUM) 10 MG tablet; TAKE 1/2 (ONE-HALF) BY MOUTH 4 TIMES DAILY AS NEEDED FOR ANXIETY  Dispense: 30 tablet; Refill: 5  Has done well with 20mg  fluoxetine for grief reaction, but anxiety and panic attacks are persistent. Will increase to FLUoxetine (PROZAC) 40 MG capsule; Take 1  capsule (40 mg total) by mouth daily.  Dispense: 90 capsule; Refill: 2  2. Panic attacks Increase fluoxetine as above.   3. Pre-diabetes Well controlled.  Continue current medications.    4. Essential (primary) hypertension Stable. Continue current medications.    Follow up 4-5 months.        The entirety of the information documented in the History of Present Illness, Review of Systems and Physical Exam were personally obtained by me. Portions of this information were initially documented by the CMA and reviewed by me for thoroughness and accuracy.      Lelon Huh, MD  [facility] [phone] (phone) [phone] (fax)  [place]

## 2020-04-11 ENCOUNTER — Other Ambulatory Visit: Payer: Self-pay

## 2020-04-11 ENCOUNTER — Ambulatory Visit (INDEPENDENT_AMBULATORY_CARE_PROVIDER_SITE_OTHER): Payer: Medicare Other | Admitting: Family Medicine

## 2020-04-11 VITALS — BP 144/63 | HR 72 | Ht 66.0 in | Wt 138.2 lb

## 2020-04-11 DIAGNOSIS — F419 Anxiety disorder, unspecified: Secondary | ICD-10-CM

## 2020-04-11 DIAGNOSIS — R7303 Prediabetes: Secondary | ICD-10-CM

## 2020-04-11 DIAGNOSIS — F410 Panic disorder [episodic paroxysmal anxiety] without agoraphobia: Secondary | ICD-10-CM | POA: Diagnosis not present

## 2020-04-11 DIAGNOSIS — I10 Essential (primary) hypertension: Secondary | ICD-10-CM

## 2020-04-11 LAB — POCT GLYCOSYLATED HEMOGLOBIN (HGB A1C)
Estimated Average Glucose: 126
Hemoglobin A1C: 6 % — AB (ref 4.0–5.6)

## 2020-04-11 MED ORDER — DIAZEPAM 10 MG PO TABS: ORAL_TABLET | ORAL | 5 refills | Status: DC

## 2020-04-11 MED ORDER — FLUOXETINE HCL 40 MG PO CAPS: 40.0000 mg | ORAL_CAPSULE | Freq: Every day | ORAL | 2 refills | Status: DC

## 2020-05-27 ENCOUNTER — Other Ambulatory Visit: Payer: Self-pay | Admitting: Family Medicine

## 2020-05-27 DIAGNOSIS — I10 Essential (primary) hypertension: Secondary | ICD-10-CM

## 2020-06-11 DIAGNOSIS — H401131 Primary open-angle glaucoma, bilateral, mild stage: Secondary | ICD-10-CM | POA: Diagnosis not present

## 2020-07-22 ENCOUNTER — Other Ambulatory Visit: Payer: Self-pay | Admitting: Family Medicine

## 2020-07-22 DIAGNOSIS — F419 Anxiety disorder, unspecified: Secondary | ICD-10-CM

## 2020-07-22 NOTE — Telephone Encounter (Signed)
Requested medication (s) are due for refill today: yes Requested medication (s) are on the active medication list: yes  Last refill:  07/03/2020  Future visit scheduled: yes  Notes to clinic:  this refill cannot be delegated    Requested Prescriptions  Pending Prescriptions Disp Refills   diazepam (VALIUM) 10 MG tablet [Pharmacy Med Name: diazePAM 10 MG Oral Tablet] 30 tablet 0    Sig: TAKE 1/2 (ONE-HALF) TABLET BY MOUTH 4 TIMES DAILY AS NEEDED FOR ANXIETY      Not Delegated - Psychiatry:  Anxiolytics/Hypnotics Failed - 07/22/2020  2:36 PM      Failed - This refill cannot be delegated      Passed - Urine Drug Screen completed in last 360 days      Passed - Valid encounter within last 6 months    Recent Outpatient Visits           3 months ago [place], Donald E, MD   6 months ago Insomnia, unspecified type   [facility] Birdie Sons, MD   10 months ago [place], Kirstie Peri, MD   1 year ago [place], Donald E, MD   1 year ago [place], Donald E, MD       Future Appointments             In 4 weeks [place], Kirstie Peri, MD [facility] [place], [place]

## 2020-08-19 ENCOUNTER — Other Ambulatory Visit: Payer: Self-pay

## 2020-08-19 ENCOUNTER — Ambulatory Visit (INDEPENDENT_AMBULATORY_CARE_PROVIDER_SITE_OTHER): Payer: Medicare Other | Admitting: Family Medicine

## 2020-08-19 ENCOUNTER — Encounter: Payer: Self-pay | Admitting: Family Medicine

## 2020-08-19 VITALS — BP 114/73 | HR 81 | Temp 97.0°F | Resp 18 | Wt 148.2 lb

## 2020-08-19 DIAGNOSIS — F410 Panic disorder [episodic paroxysmal anxiety] without agoraphobia: Secondary | ICD-10-CM

## 2020-08-19 DIAGNOSIS — F419 Anxiety disorder, unspecified: Secondary | ICD-10-CM

## 2020-08-19 DIAGNOSIS — L821 Other seborrheic keratosis: Secondary | ICD-10-CM

## 2020-08-19 MED ORDER — DIAZEPAM 10 MG PO TABS: ORAL_TABLET | ORAL | 5 refills | Status: DC

## 2020-08-19 MED ORDER — FLUOXETINE HCL 20 MG PO CAPS: 20.0000 mg | ORAL_CAPSULE | Freq: Every day | ORAL | 2 refills | Status: DC

## 2020-08-19 NOTE — Progress Notes (Signed)
Established patient visit   Patient: Joan Baldwin   DOB: [birth year]   75 y.o. Female  MRN: [id number] Visit Date: 08/19/2020  Today's healthcare provider: Lelon Huh, MD   Chief Complaint  Patient presents with   Anxiety   Panic Attack   Hypertension   Subjective    HPI  Anxiety and panic attack, Follow-up  She was last seen for anxiety 4 months ago. Changes made at last visit include increasing fluoxetine from '20mg'$  daily to '40mg'$  daily.   She reports good compliance with treatment. She reports good tolerance of treatment. She is not having side effects.   She feels her anxiety is mild and Unchanged since last visit.Patient reports her Depression has improved, but her anxiety and panic attacks are the same.   Symptoms: No chest pain No difficulty concentrating  No dizziness No fatigue  No feelings of losing control Yes insomnia  No irritable No palpitations  Yes panic attacks No racing thoughts  No shortness of breath No sweating  No tremors/shakes    GAD-7 Results GAD-7 Generalized Anxiety Disorder Screening Tool 01/08/2020  1. Feeling Nervous, Anxious, or on Edge 3  2. Not Being Able to Stop or Control Worrying 3  3. Worrying Too Much About Different Things 3  4. Trouble Relaxing 2  5. Being So Restless it's Hard To Sit Still 0  6. Becoming Easily Annoyed or Irritable 0  7. Feeling Afraid As If Something Awful Might Happen 3  Total GAD-7 Score 14  Difficulty At Work, Home, or Getting  Along With Others? Somewhat difficult    PHQ-9 Scores PHQ9 SCORE ONLY 08/19/2020 01/08/2020 08/14/2019  PHQ-9 Total Score '11 5 11    '$ ---------------------------------------------------------------------------------------------------   Hypertension, follow-up  BP Readings from Last 3 Encounters:  08/19/20 114/73  04/11/20 (!) 144/63  01/08/20 132/71   Wt Readings from Last 3 Encounters:  08/19/20 148 lb 3.2 oz (67.2 kg)  04/11/20 138 lb 3.2 oz (62.7 kg)   01/08/20 124 lb 6.4 oz (56.4 kg)     She was last seen for hypertension 4 months ago.  BP at that visit was 144/63. Management since that visit includes continue same medication.  She reports good compliance with treatment. She is not having side effects.  She is following a Regular diet. She is exercising. She does not smoke.  Use of agents associated with hypertension: NSAIDS.   Outside blood pressures are 113/60. Symptoms: No chest pain No chest pressure  No palpitations No syncope  No dyspnea No orthopnea  No paroxysmal nocturnal dyspnea No lower extremity edema   Pertinent labs: Lab Results  Component Value Date   CHOL 185 06/01/2019   HDL 44 06/01/2019   LDLCALC 127 (H) 06/01/2019   TRIG 73 06/01/2019   CHOLHDL 4.2 06/01/2019   Lab Results  Component Value Date   NA 134 (L) 01/05/2020   K 4.9 01/05/2020   CREATININE 0.80 01/05/2020   GFRNONAA >60 01/05/2020   GFRAA 79 10/01/2019   GLUCOSE 131 (H) 01/05/2020     The 10-year ASCVD risk score Mikey Bussing DC Jr., et al., 2013) is: 20.5%   ---------------------------------------------------------------------------------------------------   Skin Lesion: Patient has a skin lesion on the right upper chest that she is concerned about. She denies any pain or itching.     Medications: Outpatient Medications Prior to Visit  Medication Sig   acetaminophen (TYLENOL) 500 MG tablet Take 500 mg by mouth every 8 (eight) hours as needed.  alendronate (FOSAMAX) 70 MG tablet TAKE 1 TABLET BY MOUTH EVERY 7 DAYS. TAKE WITH A FULL GLASS OF WATER ON EMPTY STOMACH   amLODipine (NORVASC) 10 MG tablet Take 1 tablet by mouth once daily   aspirin 81 MG tablet Take 1 tablet by mouth daily.   Calcium Carbonate-Vitamin D (CALCIUM 600/VITAMIN D PO) Take 1 tablet by mouth daily.    diazepam (VALIUM) 10 MG tablet TAKE 1/2 (ONE-HALF) TABLET BY MOUTH 4 TIMES DAILY AS NEEDED FOR ANXIETY   Fiber POWD Take 2 scoop by mouth daily.   FLUoxetine  (PROZAC) 40 MG capsule Take 1 capsule (40 mg total) by mouth daily.   hydrochlorothiazide (HYDRODIURIL) 25 MG tablet Take 1 tablet by mouth once daily (Patient taking differently: As needed for fluid retention)   latanoprost (XALATAN) 0.005 % ophthalmic solution Place 1 drop into both eyes at bedtime.   lidocaine (XYLOCAINE) 2 % solution USE AS DIRECTED 15ML IN MOUTH OR THROAT EVERY 4-6 HOURS AS NEEDED FOR MOUTH PAIN   lisinopril (ZESTRIL) 40 MG tablet Take 1 tablet by mouth once daily   Multiple Vitamins-Minerals (MULTIVITAMIN ADULT PO) Take 1 tablet by mouth daily.   omeprazole (PRILOSEC) 20 MG capsule Take 1 capsule by mouth once daily   Timolol Maleate 0.5 % (DAILY) SOLN Place 1 drop into both eyes at bedtime.   traZODone (DESYREL) 100 MG tablet Take 1 tablet (100 mg total) by mouth at bedtime.   valACYclovir (VALTREX) 1000 MG tablet TAKE 2 TABLETS BY MOUTH TWICE DAILY FOR 1 DAY AS NEEDED FOR COLD SORES   Omega-3 Fatty Acids (FISH OIL) 1000 MG CAPS Take 1 capsule by mouth daily.  (Patient not taking: Reported on 08/19/2020)   No facility-administered medications prior to visit.    Review of Systems  Constitutional:  Negative for appetite change, chills, fatigue and fever.  Respiratory:  Negative for chest tightness and shortness of breath.   Cardiovascular:  Negative for chest pain and palpitations.  Gastrointestinal:  Negative for abdominal pain, nausea and vomiting.  Skin:        Skin lesion  Neurological:  Negative for dizziness and weakness.  Psychiatric/Behavioral:  The patient is nervous/anxious.       Objective    BP 114/73 (BP Location: Left Arm, Patient Position: Sitting, Cuff Size: Normal)   Pulse 81   Temp (!) 97 F (36.1 C) (Temporal)   Resp 18   Wt 148 lb 3.2 oz (67.2 kg)   BMI 23.92 kg/m     Physical Exam  General appearance: Well developed, well nourished female, cooperative and in no acute distress Head: Normocephalic, without obvious abnormality,  atraumatic Respiratory: Respirations even and unlabored, normal respiratory rate Extremities: All extremities are intact.  Skin: Seborrheic keratosis noted on top of right shoulder.  Psych: Appropriate mood and affect. Neurologic: Mental status: Alert, oriented to person, place, and time, thought content appropriate.     Assessment & Plan     1. Anxiety Slightly better with fluoxetine, but still having frequent panic attacks as below. Will add additional '20mg'$  to the '40mg'$  tablet for '60mg'$  per day - FLUoxetine (PROZAC) 20 MG capsule; Take 1 capsule (20 mg total) by mouth daily. Take along with '40mg'$  capsule daily  Dispense: 30 capsule; Refill: 2  Per her daughter is more alert since reducing diazepam. Will continue  diazepam (VALIUM) 10 MG tablet; TAKE 1/2 (ONE-HALF) TABLET BY MOUTH 4 TIMES DAILY AS NEEDED FOR ANXIETY  Dispense: 60 tablet; Refill: 5 - Ambulatory referral  to Psychology  2. Panic attacks Increase SSRI as above.  - Ambulatory referral to Psychology   Future Appointments  Date Time Provider Reform  11/24/2020  2:00 PM Tj Kitchings, Kirstie Peri, MD [facility]     3. Seborrheic keratosis Reassured of benign nature of this skin lesion.      The entirety of the information documented in the History of Present Illness, Review of Systems and Physical Exam were personally obtained by me. Portions of this information were initially documented by the CMA and reviewed by me for thoroughness and accuracy.     Lelon Huh, MD  [facility] [phone] (phone) [phone] (fax)  [place]

## 2020-08-28 ENCOUNTER — Other Ambulatory Visit: Payer: Self-pay | Admitting: Family Medicine

## 2020-08-28 DIAGNOSIS — I10 Essential (primary) hypertension: Secondary | ICD-10-CM

## 2020-10-13 ENCOUNTER — Emergency Department: Payer: Medicare Other

## 2020-10-13 ENCOUNTER — Other Ambulatory Visit: Payer: Self-pay

## 2020-10-13 ENCOUNTER — Emergency Department
Admission: EM | Admit: 2020-10-13 | Discharge: 2020-10-13 | Disposition: A | Discharge status: Home / Self Care | Payer: Medicare Other | Attending: Emergency Medicine | Admitting: Emergency Medicine

## 2020-10-13 DIAGNOSIS — Z7982 Long term (current) use of aspirin: Secondary | ICD-10-CM | POA: Insufficient documentation

## 2020-10-13 DIAGNOSIS — W101XXA Fall (on)(from) sidewalk curb, initial encounter: Secondary | ICD-10-CM | POA: Diagnosis not present

## 2020-10-13 DIAGNOSIS — S8992XA Unspecified injury of left lower leg, initial encounter: Secondary | ICD-10-CM | POA: Diagnosis present

## 2020-10-13 DIAGNOSIS — S82002A Unspecified fracture of left patella, initial encounter for closed fracture: Secondary | ICD-10-CM | POA: Diagnosis not present

## 2020-10-13 DIAGNOSIS — S82202A Unspecified fracture of shaft of left tibia, initial encounter for closed fracture: Secondary | ICD-10-CM | POA: Diagnosis not present

## 2020-10-13 DIAGNOSIS — Z79899 Other long term (current) drug therapy: Secondary | ICD-10-CM | POA: Diagnosis not present

## 2020-10-13 DIAGNOSIS — I10 Essential (primary) hypertension: Secondary | ICD-10-CM | POA: Diagnosis not present

## 2020-10-13 DIAGNOSIS — Y92480 Sidewalk as the place of occurrence of the external cause: Secondary | ICD-10-CM | POA: Diagnosis not present

## 2020-10-13 DIAGNOSIS — M25562 Pain in left knee: Secondary | ICD-10-CM | POA: Insufficient documentation

## 2020-10-13 DIAGNOSIS — Z87891 Personal history of nicotine dependence: Secondary | ICD-10-CM | POA: Insufficient documentation

## 2020-10-13 MED ORDER — HYDROCODONE-ACETAMINOPHEN 5-325 MG PO TABS
1.0000 | ORAL_TABLET | Freq: Once | ORAL | Status: AC
Start: 2020-10-13 — End: 2020-10-13
  Administered 2020-10-13: 1 via ORAL
  Filled 2020-10-13: qty 1

## 2020-10-13 MED ORDER — HYDROCODONE-ACETAMINOPHEN 5-325 MG PO TABS: 1.0000 | ORAL_TABLET | Freq: Four times a day (QID) | ORAL | 0 refills | Status: DC | PRN

## 2020-10-13 NOTE — ED Provider Notes (Signed)
Emergency Medicine Provider Triage Evaluation Note  Joan Baldwin, a 75 y.o. female  was evaluated in triage.  Pt complains of ongoing left knee pain.  She reports a mechanical fall 2 weeks ago, when she tripped over a curb, landed on her left knee, and hitting her face and chin on the concrete.  Her son was with her and denies any loss of consciousness.  Patient not receive any medical care in the interim, until today.  She complains of difficulty with ranging and bearing weight on the left knee.  She denies any nausea, vomiting, syncope, or difficulty manipulating the jaw.  Review of Systems  Positive: Left knee pain  Negative: NVD  Physical Exam  There were no vitals taken for this visit. Gen:   Awake, no distress  NAD Resp:  Normal effort CTA MSK:   Moves extremities without difficulty. tender left knee Other:  CVS: RRR  Medical Decision Making  Medically screening exam initiated at 11:56 AM.  Appropriate orders placed.  Joan Baldwin was informed that the remainder of the evaluation will be completed by another provider, this initial triage assessment does not replace that evaluation, and the importance of remaining in the ED until their evaluation is complete.  Patient ED evaluation of mechanical fall resulting in left knee pain.   Melvenia Needles, PA-C 10/13/20 1158    Rada Hay, MD 10/13/20 [phone]

## 2020-10-13 NOTE — ED Notes (Signed)
See triage note     presents s/p fall 2 weeks ago  states missed the  crub  landed on left knee   states pain is moving up her leg  she is able to bear wt with pain

## 2020-10-13 NOTE — ED Triage Notes (Signed)
Pt fell 2 weeks ago over a curb, pt reports landing on her knees and hit her face, pt states that the pain hasn't gotten better in the left knee and radiating into the left thigh

## 2020-10-13 NOTE — ED Provider Notes (Signed)
[facility] Emergency Department Provider Note  ____________________________________________   Event Date/Time   First MD Initiated Contact with Patient 10/13/20 1219     (approximate)  I have reviewed the triage vital signs and the nursing notes.   HISTORY  Chief Complaint Leg Injury    HPI Joan Baldwin is a 75 y.o. female presents emergency department after a fall 2 weeks ago.  Patient fell landing on her left knee and hitting her head.  Her son states that the bruising from the chin has gone away.  Bruising at the knee is gone away but she continues to complain of pain.  No LOC, vomiting.  Past Medical History:  Diagnosis Date   Anxiety    can cause palpitations   Balance problems    had brain scan (10/02/19) / clearance given by Dr Caryn Section   Cataract    Complication of anesthesia    patient state, "they have to give me alot to go out" during colonoscopy   Depression    GERD (gastroesophageal reflux disease)    Glaucoma    Hepatitis    B w treatment/ been cleared (Dr Maralyn Sago records)   History of chicken pox    Hypertension    controlled on meds   Osteoporosis    Sepsis ([place]) 01/17/2016   Wears dentures    upper    Patient Active Problem List   Diagnosis Date Noted   Chronic hepatitis C without hepatic coma ([place]) 06/01/2019   Hepatitis C virus infection without hepatic coma 09/03/2018   Chronic dental pain 08/30/2018   History of adenomatous polyp of colon 12/29/2016   History of smoking 30 or more pack years 05/26/2016   Glaucoma 11/22/2014   Skin lesion 11/22/2014   Osteoporosis 02/13/2013   Pre-diabetes 08/10/2009   Essential (primary) hypertension 05/06/2006   Anxiety 01/11/2001   Insomnia 01/11/2001   GERD (gastroesophageal reflux disease) 01/11/2001    Past Surgical History:  Procedure Laterality Date   ABDOMINAL HYSTERECTOMY     vaginal. Still has both ovaries   back surgeries     x 2   CATARACT EXTRACTION  W/PHACO Left 10/09/2019   Procedure: CATARACT EXTRACTION PHACO AND INTRAOCULAR LENS PLACEMENT ([place]) LEFT;  Surgeon: Birder Robson, MD;  Location: [place];  Service: Ophthalmology;  Laterality: Left;  6.16 0:46.0   CATARACT EXTRACTION W/PHACO Right 10/30/2019   Procedure: CATARACT EXTRACTION PHACO AND INTRAOCULAR LENS PLACEMENT ([facility]) RIGHT 6.31 00:46.1;  Surgeon: Birder Robson, MD;  Location: [place];  Service: Ophthalmology;  Laterality: Right;   COLONOSCOPY  2007   done in [place] per patient. Normal except for moderate hemorrhoids   MOUTH SURGERY  2011   STOMACH SURGERY     stapled for obesity/later reversed   Tailbone surgery     removal of coccyx    Prior to Admission medications   Medication Sig Start Date End Date Taking? Authorizing Provider  HYDROcodone-acetaminophen (NORCO/VICODIN) 5-325 MG tablet Take 1 tablet by mouth every 6 (six) hours as needed for moderate pain. 10/13/20  Yes Darby Shadwick, Linden Dolin, PA-C  acetaminophen (TYLENOL) 500 MG tablet Take 500 mg by mouth every 8 (eight) hours as needed.     [provider]  alendronate (FOSAMAX) 70 MG tablet TAKE 1 TABLET BY MOUTH EVERY 7 DAYS. TAKE WITH A FULL GLASS OF WATER ON EMPTY STOMACH 11/11/18   Birdie Sons, MD  amLODipine (NORVASC) 10 MG tablet Take 1 tablet by mouth once daily  08/28/20   Birdie Sons, MD  aspirin 81 MG tablet Take 1 tablet by mouth daily.    [provider]  Calcium Carbonate-Vitamin D (CALCIUM 600/VITAMIN D PO) Take 1 tablet by mouth daily.     [provider]  diazepam (VALIUM) 10 MG tablet TAKE 1/2 (ONE-HALF) TABLET BY MOUTH 4 TIMES DAILY AS NEEDED FOR ANXIETY 08/19/20   Birdie Sons, MD  Fiber POWD Take 2 scoop by mouth daily.    [provider]  FLUoxetine (PROZAC) 20 MG capsule Take 1 capsule (20 mg total) by mouth daily. Take along with 40mg  capsule daily 08/19/20   Birdie Sons, MD  FLUoxetine (PROZAC) 40 MG capsule Take 1  capsule (40 mg total) by mouth daily. 04/11/20   Birdie Sons, MD  hydrochlorothiazide (HYDRODIURIL) 25 MG tablet Take 1 tablet by mouth once daily Patient taking differently: As needed for fluid retention 03/26/19   Birdie Sons, MD  latanoprost (XALATAN) 0.005 % ophthalmic solution Place 1 drop into both eyes at bedtime.    [provider]  lidocaine (XYLOCAINE) 2 % solution USE AS DIRECTED 15ML IN MOUTH OR THROAT EVERY 4-6 HOURS AS NEEDED FOR MOUTH PAIN 09/25/19   Birdie Sons, MD  lisinopril (ZESTRIL) 40 MG tablet Take 1 tablet by mouth once daily 08/28/20   Birdie Sons, MD  Multiple Vitamins-Minerals (MULTIVITAMIN ADULT PO) Take 1 tablet by mouth daily.    [provider]  Omega-3 Fatty Acids (FISH OIL) 1000 MG CAPS Take 1 capsule by mouth daily.  Patient not taking: Reported on 08/19/2020 08/04/09   [provider]  omeprazole (PRILOSEC) 20 MG capsule Take 1 capsule by mouth once daily 01/23/19   Birdie Sons, MD  Timolol Maleate 0.5 % (DAILY) SOLN Place 1 drop into both eyes at bedtime.    [provider]  traZODone (DESYREL) 100 MG tablet Take 1 tablet (100 mg total) by mouth at bedtime. 01/08/20   Birdie Sons, MD  valACYclovir (VALTREX) 1000 MG tablet TAKE 2 TABLETS BY MOUTH TWICE DAILY FOR 1 DAY AS NEEDED FOR COLD SORES 06/30/18   Birdie Sons, MD    Allergies Oxcarbazepine, Gabapentin, Fluoxetine, Tramadol hcl, Ultram  [tramadol hcl], and Codeine  Family History  Problem Relation Age of Onset   Emphysema Mother    Kidney failure Sister    Heart Problems Brother    Diabetes Brother    Skin cancer Brother    Breast cancer Neg Hx     Social History Social History   Tobacco Use   Smoking status: Former    Packs/day: 1.00    Years: 30.00    Pack years: 30.00    Types: Cigarettes    Quit date: 01/11/2001    Years since quitting: 19.7   Smokeless tobacco: Never  Vaping Use   Vaping Use: Never used  Substance Use  Topics   Alcohol use: Yes    Alcohol/week: 0.0 standard drinks   Drug use: No    Review of Systems  Constitutional: No fever/chills Eyes: No visual changes. ENT: No sore throat. Respiratory: Denies cough Cardiovascular: Denies chest pain Gastrointestinal: Denies abdominal pain Genitourinary: Negative for dysuria. Musculoskeletal: Negative for back pain. Skin: Negative for rash. Psychiatric: no mood changes,     ____________________________________________   PHYSICAL EXAM:  VITAL SIGNS: ED Triage Vitals  Enc Vitals Group     BP 10/13/20 1155 (!) 137/56     Pulse Rate 10/13/20 1155  96     Resp 10/13/20 1155 16     Temp 10/13/20 1155 98.7 F (37.1 C)     Temp Source 10/13/20 1155 Oral     SpO2 10/13/20 1155 96 %     Weight 10/13/20 1157 150 lb (68 kg)     Height 10/13/20 1157 5\' 6"  (1.676 m)     Head Circumference --      Peak Flow --      Pain Score 10/13/20 1157 7     Pain Loc --      Pain Edu? --      Excl. in [place]? --     Constitutional: Alert and oriented. Well appearing and in no acute distress. Eyes: Conjunctivae are normal.  Head: Atraumatic. Nose: No congestion/rhinnorhea. Mouth/Throat: Mucous membranes are moist.   Neck:  supple no lymphadenopathy noted Cardiovascular: Normal rate, regular rhythm.  Respiratory: Normal respiratory effort.  No retractions,  GU: deferred Musculoskeletal: FROM all extremities, warm and well perfused, left knee is swollen and tender, warm to touch Neurologic:  Normal speech and language.  Skin:  Skin is warm, dry and intact. No rash noted. Psychiatric: Mood and affect are normal. Speech and behavior are normal.  ____________________________________________   LABS (all labs ordered are listed, but only abnormal results are displayed)  Labs Reviewed - No data to display ____________________________________________   ____________________________________________  RADIOLOGY X-ray of the left  knee  ____________________________________________   PROCEDURES  Procedure(s) performed:   .Ortho Injury Treatment  Date/Time: 10/13/2020 1:18 PM Performed by: Versie Starks, PA-C Authorized by: Versie Starks, PA-C   Consent:    Consent obtained:  Verbal   Consent given by:  Patient   Risks discussed:  Nerve damage, restricted joint movement, vascular damage and stiffnessInjury location: knee Location details: left knee Injury type: fracture Fracture type: patellar Pre-procedure neurovascular assessment: neurovascularly intact Pre-procedure distal perfusion: normal Pre-procedure neurological function: normal Pre-procedure range of motion: normal  Anesthesia: Local anesthesia used: no  Patient sedated: NoManipulation performed: no Immobilization: brace Splint Applied by: ED Nurse Post-procedure neurovascular assessment: post-procedure neurovascularly intact Post-procedure distal perfusion: normal Post-procedure neurological function: normal Post-procedure range of motion: normal Comments: Knee immobilizer applied, walker given      ____________________________________________   INITIAL IMPRESSION / ASSESSMENT AND PLAN / ED COURSE  Pertinent labs & imaging results that were available during my care of the patient were reviewed by me and considered in my medical decision making (see chart for details).   Patient 75 year old female presents emergency department after a fall.  See HPI.  Physical exam she has a left knee to be warm and tender, full range of motion, patient is able to bear weight but with difficulty  X-ray of the left knee reviewed by me confirmed by radiology 10 fracture at the patella.  Patient was placed in a knee immobilizer and given a walker.  She was given pain medication but with strict instructions to only use at bedtime.  Explained to her son who lives with her that it would increase her fall risk.  They are to return emergency department  worsening.  Follow-up with orthopedics in 1 week.  Since its been 2 weeks and the patient has not had any neurologic difficulties I feel that she does not need a CT of the head.  Patient and son are in agreement treatment plan.  She is discharged stable condition     Joan Baldwin was evaluated in Emergency Department on 10/13/2020 for  the symptoms described in the history of present illness. She was evaluated in the context of the global COVID-19 pandemic, which necessitated consideration that the patient might be at risk for infection with the SARS-CoV-2 virus that causes COVID-19. Institutional protocols and algorithms that pertain to the evaluation of patients at risk for COVID-19 are in a state of rapid change based on information released by regulatory bodies including the CDC and federal and state organizations. These policies and algorithms were followed during the patient's care in the ED.    As part of my medical decision making, I reviewed the following data within the [place] History obtained from family, Nursing notes reviewed and incorporated, Old chart reviewed, Radiograph reviewed , Notes from prior ED visits, and [place] Controlled Substance Database  ____________________________________________   FINAL CLINICAL IMPRESSION(S) / ED DIAGNOSES  Final diagnoses:  Closed nondisplaced fracture of left patella, unspecified fracture morphology, initial encounter      NEW MEDICATIONS STARTED DURING THIS VISIT:  New Prescriptions   HYDROCODONE-ACETAMINOPHEN (NORCO/VICODIN) 5-325 MG TABLET    Take 1 tablet by mouth every 6 (six) hours as needed for moderate pain.     Note:  This document was prepared using Dragon voice recognition software and may include unintentional dictation errors.    Versie Starks, PA-C 10/13/20 1322    Nena Polio, MD 10/13/20 [phone]

## 2020-10-13 NOTE — Discharge Instructions (Signed)
Apply ice.  Wear the knee brace.  You may take it off to get in the shower.  Use the walker so that you do not fall.  Return emergency department worsening.  Follow-up with Dr. Sherilyn Dacosta office by calling and make an appointment.

## 2020-10-13 NOTE — ED Notes (Signed)
Pt in xray

## 2020-10-21 DIAGNOSIS — S82015A Nondisplaced osteochondral fracture of left patella, initial encounter for closed fracture: Secondary | ICD-10-CM | POA: Diagnosis not present

## 2020-11-04 ENCOUNTER — Ambulatory Visit: Payer: Self-pay | Admitting: *Deleted

## 2020-11-04 ENCOUNTER — Telehealth: Payer: Self-pay | Admitting: Family Medicine

## 2020-11-04 DIAGNOSIS — F419 Anxiety disorder, unspecified: Secondary | ICD-10-CM

## 2020-11-04 MED ORDER — DIAZEPAM 10 MG PO TABS: 5.0000 mg | ORAL_TABLET | Freq: Four times a day (QID) | ORAL | 0 refills | Status: DC | PRN

## 2020-11-04 NOTE — Telephone Encounter (Signed)
Reason for Disposition  [1] Symptoms of anxiety or panic attack AND [2] is a chronic symptom (recurrent or ongoing AND present > 4 weeks)  Answer Assessment - Initial Assessment Questions 1. CONCERN: "Did anything happen that prompted you to call today?"      Run out of medication or medication is gone  2. ANXIETY SYMPTOMS: "Can you describe how you (your loved one; patient) have been feeling?" (e.g., tense, restless, panicky, anxious, keyed up, overwhelmed, sense of impending doom).      Restless , keyed up , "feels like she is going to explode" 3. ONSET: "How long have you been feeling this way?" (e.g., hours, days, weeks)     Hx anxiety for years and took last 1/2 tablet this am  4. SEVERITY: "How would you rate the level of anxiety?" (e.g., 0 - 10; or mild, moderate, severe).     "Feel like I'm gonna explode" 5. FUNCTIONAL IMPAIRMENT: "How have these feelings affected your ability to do daily activities?" "Have you had more difficulty than usual doing your normal daily activities?" (e.g., getting better, same, worse; self-care, school, work, interactions)     Yes  6. HISTORY: "Have you felt this way before?" "Have you ever been diagnosed with an anxiety problem in the past?" (e.g., generalized anxiety disorder, panic attacks, PTSD). If Yes, ask: "How was this problem treated?" (e.g., medicines, counseling, etc.)     Yes hx anxiety taking medication valium  7. RISK OF HARM - SUICIDAL IDEATION: "Do you ever have thoughts of hurting or killing yourself?" If Yes, ask:  "Do you have these feelings now?" "Do you have a plan on how you would do this?"     Denies  8. TREATMENT:  "What has been done so far to treat this anxiety?" (e.g., medicines, relaxation strategies). "What has helped?"     Medication  9. TREATMENT - THERAPIST: "Do you have a counselor or therapist? Name?"     na 10. POTENTIAL TRIGGERS: "Do you drink caffeinated beverages (e.g., coffee, colas, teas), and how much daily?" "Do you  drink alcohol or use any drugs?" "Have you started any new medicines recently?"     No  10. PATIENT SUPPORT: "Who is with you now?" "Who do you live with?" "Do you have family or friends who you can talk to?"        Son  60. OTHER SYMPTOMS: "Do you have any other symptoms?" (e.g., feeling depressed, trouble concentrating, trouble sleeping, trouble breathing, palpitations or fast heartbeat, chest pain, sweating, nausea, or diarrhea)       Denies chest pain , difficulty breathing . 12. PREGNANCY: "Is there any chance you are pregnant?" "When was your last menstrual period?"       na  Protocols used: Anxiety and Panic Attack-A-AH

## 2020-11-04 NOTE — Telephone Encounter (Signed)
Patient trying to fill early because she is out and thinks someone may have taken some. Medication Refill - Medication: diazepam (VALIUM) 10 MG tablet   Has the patient contacted their pharmacy? yes (Agent: If no, request that the patient contact the pharmacy for the refill. If patient does not wish to contact the pharmacy document the reason why and proceed with request.) (Agent: If yes, when and what did the pharmacy advise?)  Preferred Pharmacy (with phone number or street name):  [facility], [place] Phone:  [phone]  Fax:  [phone]     Has the patient been seen for an appointment in the last year OR does the patient have an upcoming appointment? yes  Agent: Please be advised that RX refills may take up to 3 business days. We ask that you follow-up with your pharmacy.

## 2020-11-04 NOTE — Telephone Encounter (Signed)
Patient 's son , Suzy Bouchard called to request appt with PCP. Patient with son now and anxious due to ran out of medications. Patient reports she took 1/2 tab this am and now has no more medication. Son reports patient just had #60 refilled on 10/27/20 and now no tablets are left. Patient's son reports he believes patient's daughter in law took the medications due to she feels patient is taking too many. Patient reports she takes medication the way she is supposed to and has taken valium for many years for her anxiety management. Patient anxious at this time and would like for PCP to allow her enough medication until she can be seen by him. Patient reports she "feels like I'm gonna explode". Instructed patient 's son to take patient to ED if anxiety symptoms worsen. Earliest appt 11/24/20. Not scheduled at this time due to patient wants earlier appt. Patient requesting for PCP to "please" call her today . Can patient have enough medications until future visit established? # to Urgent crisis center given to patient's son [phone]. Patient son reports he is getting patient a safe and will now be assisting patient with medication administration . Care advise given. Patient verbalized understanding of care advise and to call back or go to ED if symptoms worsen.

## 2020-11-04 NOTE — Telephone Encounter (Signed)
Called patient and son answered. Contacted on call provider and recommended ED or have PCP contact pharmacy in am . Notified patient and son recommendations and son reports patient is calm at this time. Son will f/u in am.

## 2020-11-04 NOTE — Telephone Encounter (Signed)
Patient is calling back to request NT call pharmacy to confirm refill for requested valium 10 mg today . 11/04/20 message from Dr. Caryn Section at 4:57 pm states "have sent prescription for 30 supply to [address] permission to fill early". Patient advised at 5:04 pm. [facility] on [address] [phone]. Pharmacist requires authorization for medication refill early by PCP. Called on call provider to notify pharmacy permission to refill Rx early. Awaiting call back [phone].

## 2020-11-04 NOTE — Telephone Encounter (Signed)
Medication refilled today by provider in another encounter.

## 2020-11-04 NOTE — Telephone Encounter (Signed)
Have sent prescription for 30 day supply to [address] with permission to fill early.

## 2020-11-04 NOTE — Telephone Encounter (Signed)
Pt advised.   Thanks,   -Tennis Mckinnon  

## 2020-11-04 NOTE — Telephone Encounter (Signed)
Copied from [place] [phone]. Topic: Appointment Scheduling - Scheduling Inquiry for Clinic >> Nov 04, 2020  3:18 PM Greggory Keen D wrote: Reason for CRM: Pt called saying she needs to be seen asap by Dr. Caryn Section for anxiety.  He reduced her medication and she is having anxiety  CB#  [phone]

## 2020-11-05 ENCOUNTER — Telehealth: Payer: Self-pay

## 2020-11-05 NOTE — Telephone Encounter (Signed)
I spoke with the pharmacist at [facility].  She states they filled it today and Ms Todaro has been advised.     Thanks,   -Mickel Baas

## 2020-11-05 NOTE — Telephone Encounter (Signed)
FYI

## 2020-11-05 NOTE — Telephone Encounter (Signed)
Copied from [place] [id number]. Topic: General - Other >> Nov 05, 2020  9:45 AM Valere Dross wrote: Reason for CRM: Pt called in stating when she contacted the pharmacy they told her they did not have her medication refill, but looking at the chart, it shows it was sent over yesterday, pt requested if pcp could look into It.

## 2020-11-10 ENCOUNTER — Other Ambulatory Visit: Payer: Self-pay

## 2020-11-10 ENCOUNTER — Emergency Department: Payer: Medicare Other

## 2020-11-10 ENCOUNTER — Emergency Department
Admission: EM | Admit: 2020-11-10 | Discharge: 2020-11-10 | Disposition: A | Discharge status: Home / Self Care | Payer: Medicare Other | Attending: Emergency Medicine | Admitting: Emergency Medicine

## 2020-11-10 DIAGNOSIS — Z7982 Long term (current) use of aspirin: Secondary | ICD-10-CM | POA: Diagnosis not present

## 2020-11-10 DIAGNOSIS — Z87891 Personal history of nicotine dependence: Secondary | ICD-10-CM | POA: Insufficient documentation

## 2020-11-10 DIAGNOSIS — R0789 Other chest pain: Secondary | ICD-10-CM

## 2020-11-10 DIAGNOSIS — T1490XA Injury, unspecified, initial encounter: Secondary | ICD-10-CM

## 2020-11-10 DIAGNOSIS — W19XXXA Unspecified fall, initial encounter: Secondary | ICD-10-CM | POA: Diagnosis not present

## 2020-11-10 DIAGNOSIS — S6992XA Unspecified injury of left wrist, hand and finger(s), initial encounter: Secondary | ICD-10-CM | POA: Diagnosis present

## 2020-11-10 DIAGNOSIS — S52602A Unspecified fracture of lower end of left ulna, initial encounter for closed fracture: Secondary | ICD-10-CM

## 2020-11-10 DIAGNOSIS — I10 Essential (primary) hypertension: Secondary | ICD-10-CM | POA: Insufficient documentation

## 2020-11-10 DIAGNOSIS — Z9889 Other specified postprocedural states: Secondary | ICD-10-CM | POA: Diagnosis not present

## 2020-11-10 DIAGNOSIS — R059 Cough, unspecified: Secondary | ICD-10-CM | POA: Insufficient documentation

## 2020-11-10 DIAGNOSIS — M7989 Other specified soft tissue disorders: Secondary | ICD-10-CM | POA: Diagnosis not present

## 2020-11-10 DIAGNOSIS — M19012 Primary osteoarthritis, left shoulder: Secondary | ICD-10-CM | POA: Diagnosis not present

## 2020-11-10 DIAGNOSIS — M79602 Pain in left arm: Secondary | ICD-10-CM | POA: Diagnosis not present

## 2020-11-10 DIAGNOSIS — Z79899 Other long term (current) drug therapy: Secondary | ICD-10-CM | POA: Diagnosis not present

## 2020-11-10 MED ORDER — ONDANSETRON 4 MG PO TBDP: 4.0000 mg | ORAL_TABLET | Freq: Three times a day (TID) | ORAL | 0 refills | Status: DC | PRN

## 2020-11-10 MED ORDER — OXYCODONE-ACETAMINOPHEN 5-325 MG PO TABS: 1.0000 | ORAL_TABLET | Freq: Four times a day (QID) | ORAL | 0 refills | Status: DC | PRN

## 2020-11-10 NOTE — ED Provider Notes (Signed)
Emergency Medicine Provider Triage Evaluation Note  DEVAN BABINO , a 75 y.o. female  was evaluated in triage.  Pt complains of left-sided rib pain and left forearm pain from a fall 2 days ago.  Patient reports that she tripped walking her dog and fell forward.  She denies hitting her head or her neck.  No blood thinner use at home.  No numbness or tingling in the upper and lower extremities.  No chest pain, chest tightness or abdominal pain.  Review of Systems  Positive: Patient has left forearm pain and left rib pain.  Negative: No chest pain, chest tightness or abdominal pain.   Physical Exam  BP (!) 128/57 (BP Location: Right Arm)   Pulse 97   Temp 98.3 F (36.8 C) (Oral)   Resp 20   Ht 5' 6.5" (1.689 m)   Wt 68 kg   SpO2 97%   BMI 23.85 kg/m  Gen:   Awake, no distress   Resp:  Normal effort  MSK:   Moves extremities without difficulty  Other:  Patient has bruising of left forearm.   Medical Decision Making  Medically screening exam initiated at 3:12 PM.  Appropriate orders placed.  Mischele S Uriarte was informed that the remainder of the evaluation will be completed by another provider, this initial triage assessment does not replace that evaluation, and the importance of remaining in the ED until their evaluation is complete.  Xrays of left forearm and left ribs in process.    Vallarie Mare Scandia, PA-C 11/10/20 1514    Duffy Bruce, MD 11/11/20 1459

## 2020-11-10 NOTE — ED Provider Notes (Signed)
[facility] Emergency Department Provider Note  ____________________________________________   Event Date/Time   First MD Initiated Contact with Patient 11/10/20 1817     (approximate)  I have reviewed the triage vital signs and the nursing notes.   HISTORY  Chief Complaint Fall    HPI Joan Baldwin is a 75 y.o. female with past medical history as below here with left arm pain after fall as well as left chest wall pain.  The patient states she was walking her dog to or 3 days ago.  She lost her footing, falling onto her left arm and then her left chest wall.  She has since had left wrists and forearm pain as well as left chest wall pain.  The pain of her chest wall is worse with any kind of movement or breathing.  It is worse than the arm pain.  She said associated mild cough but no shortness of breath.  No history of rib fractures.  No blood thinner use.  She does not believe she hit her head or neck.  Regarding her arm pain, she is right-handed.  She states pain is worse with any movement or palpation.  Denies any distal numbness or weakness.  No open wounds.    Past Medical History:  Diagnosis Date   Anxiety    can cause palpitations   Balance problems    had brain scan (10/02/19) / clearance given by Dr Caryn Section   Cataract    Complication of anesthesia    patient state, "they have to give me alot to go out" during colonoscopy   Depression    GERD (gastroesophageal reflux disease)    Glaucoma    Hepatitis    B w treatment/ been cleared (Dr Maralyn Sago records)   History of chicken pox    Hypertension    controlled on meds   Osteoporosis    Sepsis ([place]) 01/17/2016   Wears dentures    upper    Patient Active Problem List   Diagnosis Date Noted   Chronic hepatitis C without hepatic coma ([place]) 06/01/2019   Hepatitis C virus infection without hepatic coma 09/03/2018   Chronic dental pain 08/30/2018   History of adenomatous polyp of colon  12/29/2016   History of smoking 30 or more pack years 05/26/2016   Glaucoma 11/22/2014   Skin lesion 11/22/2014   Osteoporosis 02/13/2013   Pre-diabetes 08/10/2009   Essential (primary) hypertension 05/06/2006   Anxiety 01/11/2001   Insomnia 01/11/2001   GERD (gastroesophageal reflux disease) 01/11/2001    Past Surgical History:  Procedure Laterality Date   ABDOMINAL HYSTERECTOMY     vaginal. Still has both ovaries   back surgeries     x 2   CATARACT EXTRACTION W/PHACO Left 10/09/2019   Procedure: CATARACT EXTRACTION PHACO AND INTRAOCULAR LENS PLACEMENT ([place]) LEFT;  Surgeon: Birder Robson, MD;  Location: [facility];  Service: Ophthalmology;  Laterality: Left;  6.16 0:46.0   CATARACT EXTRACTION W/PHACO Right 10/30/2019   Procedure: CATARACT EXTRACTION PHACO AND INTRAOCULAR LENS PLACEMENT ([facility]) RIGHT 6.31 00:46.1;  Surgeon: Birder Robson, MD;  Location: [facility];  Service: Ophthalmology;  Laterality: Right;   COLONOSCOPY  2007   done in [place] per patient. Normal except for moderate hemorrhoids   MOUTH SURGERY  2011   STOMACH SURGERY     stapled for obesity/later reversed   Tailbone surgery     removal of coccyx    Prior to Admission medications   Medication Sig  Start Date End Date Taking? Authorizing Provider  ondansetron (ZOFRAN ODT) 4 MG disintegrating tablet Take 1 tablet (4 mg total) by mouth every 8 (eight) hours as needed for nausea or vomiting. 11/10/20  Yes Duffy Bruce, MD  oxyCODONE-acetaminophen (PERCOCET) 5-325 MG tablet Take 1-2 tablets by mouth every 6 (six) hours as needed for severe pain or moderate pain (no more than 6 tabs daily). 11/10/20 11/10/21 Yes Duffy Bruce, MD  acetaminophen (TYLENOL) 500 MG tablet Take 500 mg by mouth every 8 (eight) hours as needed.     [provider]  alendronate (FOSAMAX) 70 MG tablet TAKE 1 TABLET BY MOUTH EVERY 7 DAYS. TAKE WITH A FULL GLASS OF WATER ON EMPTY STOMACH 11/11/18    Birdie Sons, MD  amLODipine (NORVASC) 10 MG tablet Take 1 tablet by mouth once daily 08/28/20   Birdie Sons, MD  aspirin 81 MG tablet Take 1 tablet by mouth daily.    [provider]  Calcium Carbonate-Vitamin D (CALCIUM 600/VITAMIN D PO) Take 1 tablet by mouth daily.     [provider]  diazepam (VALIUM) 10 MG tablet Take 0.5-1 tablets (5-10 mg total) by mouth every 6 (six) hours as needed for anxiety. 11/04/20   Birdie Sons, MD  Fiber POWD Take 2 scoop by mouth daily.    [provider]  FLUoxetine (PROZAC) 20 MG capsule Take 1 capsule (20 mg total) by mouth daily. Take along with 40mg  capsule daily 08/19/20   Birdie Sons, MD  FLUoxetine (PROZAC) 40 MG capsule Take 1 capsule (40 mg total) by mouth daily. 04/11/20   Birdie Sons, MD  hydrochlorothiazide (HYDRODIURIL) 25 MG tablet Take 1 tablet by mouth once daily Patient taking differently: As needed for fluid retention 03/26/19   Birdie Sons, MD  latanoprost (XALATAN) 0.005 % ophthalmic solution Place 1 drop into both eyes at bedtime.    [provider]  lidocaine (XYLOCAINE) 2 % solution USE AS DIRECTED 15ML IN MOUTH OR THROAT EVERY 4-6 HOURS AS NEEDED FOR MOUTH PAIN 09/25/19   Birdie Sons, MD  lisinopril (ZESTRIL) 40 MG tablet Take 1 tablet by mouth once daily 08/28/20   Birdie Sons, MD  Multiple Vitamins-Minerals (MULTIVITAMIN ADULT PO) Take 1 tablet by mouth daily.    [provider]  Omega-3 Fatty Acids (FISH OIL) 1000 MG CAPS Take 1 capsule by mouth daily.  Patient not taking: Reported on 08/19/2020 08/04/09   [provider]  omeprazole (PRILOSEC) 20 MG capsule Take 1 capsule by mouth once daily 01/23/19   Birdie Sons, MD  Timolol Maleate 0.5 % (DAILY) SOLN Place 1 drop into both eyes at bedtime.    [provider]  traZODone (DESYREL) 100 MG tablet Take 1 tablet (100 mg total) by mouth at bedtime. 01/08/20   Birdie Sons, MD  valACYclovir  (VALTREX) 1000 MG tablet TAKE 2 TABLETS BY MOUTH TWICE DAILY FOR 1 DAY AS NEEDED FOR COLD SORES 06/30/18   Birdie Sons, MD    Allergies Oxcarbazepine, Gabapentin, Fluoxetine, Tramadol hcl, Ultram  [tramadol hcl], and Codeine  Family History  Problem Relation Age of Onset   Emphysema Mother    Kidney failure Sister    Heart Problems Brother    Diabetes Brother    Skin cancer Brother    Breast cancer Neg Hx     Social History Social History   Tobacco Use   Smoking status: Former    Packs/day: 1.00  Years: 30.00    Pack years: 30.00    Types: Cigarettes    Quit date: 01/11/2001    Years since quitting: 19.8   Smokeless tobacco: Never  Vaping Use   Vaping Use: Never used  Substance Use Topics   Alcohol use: Yes    Alcohol/week: 0.0 standard drinks   Drug use: No    Review of Systems  Review of Systems  Constitutional:  Positive for fatigue. Negative for fever.  HENT:  Negative for congestion and sore throat.   Eyes:  Negative for visual disturbance.  Respiratory:  Negative for cough and shortness of breath.   Cardiovascular:  Positive for chest pain.  Gastrointestinal:  Negative for abdominal pain, diarrhea, nausea and vomiting.  Genitourinary:  Negative for flank pain.  Musculoskeletal:  Positive for arthralgias. Negative for back pain and neck pain.  Skin:  Negative for rash and wound.  Neurological:  Negative for weakness.  All other systems reviewed and are negative.   ____________________________________________  PHYSICAL EXAM:      VITAL SIGNS: ED Triage Vitals  Enc Vitals Group     BP 11/10/20 1450 (!) 128/57     Pulse Rate 11/10/20 1450 97     Resp 11/10/20 1450 20     Temp 11/10/20 1450 98.3 F (36.8 C)     Temp Source 11/10/20 1450 Oral     SpO2 11/10/20 1450 97 %     Weight 11/10/20 1450 150 lb (68 kg)     Height 11/10/20 1450 5' 6.5" (1.689 m)     Head Circumference --      Peak Flow --      Pain Score 11/10/20 1508 8     Pain Loc --       Pain Edu? --      Excl. in [place]? --      Physical Exam Vitals and nursing note reviewed.  Constitutional:      General: She is not in acute distress.    Appearance: She is well-developed.  HENT:     Head: Normocephalic and atraumatic.  Eyes:     Conjunctiva/sclera: Conjunctivae normal.  Cardiovascular:     Rate and Rhythm: Normal rate and regular rhythm.     Heart sounds: Normal heart sounds.  Pulmonary:     Effort: Pulmonary effort is normal. No respiratory distress.     Breath sounds: No wheezing.  Chest:     Comments: Significant chest wall tenderness over the left anterolateral chest.  No bruising or deformity.  Normal work of breathing.  Lung sounds clear. Abdominal:     General: There is no distension.  Musculoskeletal:     Cervical back: Neck supple.  Skin:    General: Skin is warm.     Capillary Refill: Capillary refill takes less than 2 seconds.     Findings: No rash.  Neurological:     Mental Status: She is alert and oriented to person, place, and time.     Motor: No abnormal muscle tone.     UPPER EXTREMITY EXAM: left  INSPECTION & PALPATION: Significant tenderness along the mid to distal ulna with associated bruising extending throughout the forearm.  Minimal edema.  No open wounds.  Mild tenderness throughout the dorsum of the hand, particularly along the mid dorsal hand.  No snuffbox tenderness.  SENSORY: Sensation is intact to light touch in:  Superficial radial nerve distribution (dorsal first web space) Median nerve distribution (tip of index finger)   Ulnar nerve  distribution (tip of small finger)     MOTOR:  + Motor posterior interosseous nerve (thumb IP extension) + Anterior interosseous nerve (thumb IP flexion, index finger DIP flexion) + Radial nerve (wrist extension) + Median nerve (palpable firing thenar mass) + Ulnar nerve (palpable firing of first dorsal interosseous muscle)  VASCULAR: 2+ radial pulse Brisk capillary refill < 2 sec,  fingers warm and well-perfused  COMPARTMENTS: Soft, warm, well-perfused No pain with passive extension No paresthesias  ____________________________________________   LABS (all labs ordered are listed, but only abnormal results are displayed)  Labs Reviewed - No data to display  ____________________________________________  EKG:  ________________________________________  RADIOLOGY All imaging, including plain films, CT scans, and ultrasounds, independently reviewed by me, and interpretations confirmed via formal radiology reads.  ED MD interpretation:   DG ribs left: No acute abnormality, no left rib fracture DG forearm left: Mildly displaced distal left ulnar diaphyseal fracture, lucency at the triquetrium, atypical for acute fracture  Official radiology report(s): DG Ribs Unilateral W/Chest Left  Result Date: 11/10/2020 CLINICAL DATA:  Left arm pain after fall. EXAM: LEFT RIBS AND CHEST - 3+ VIEW COMPARISON:  Chest 01/17/2016 FINDINGS: Postsurgical changes in the upper abdomen. Both lungs are clear. Negative for a pneumothorax. Heart is slightly deviated towards the right side of the chest and this is chronic. Trachea is midline. Negative for left rib fracture. Left clavicle is intact. Visualized proximal left humerus is intact. Degenerative changes at the left glenohumeral joint. IMPRESSION: 1. No acute chest abnormality. 2. Negative for a left rib fracture. Electronically Signed   By: Markus Daft M.D.   On: 11/10/2020 15:59   DG Forearm Left  Result Date: 11/10/2020 CLINICAL DATA:  [place] a couple days ago while walking the dog, tripped over dog, LEFT arm pain and deformity, bruising EXAM: LEFT FOREARM - 2 VIEW COMPARISON:  None FINDINGS: Osseous demineralization. Minimal narrowing of radiocarpal joint. Oblique fracture of the distal LEFT ulnar diaphysis, mildly displaced ulnar. No additional fracture or dislocation. Lucency at the triquetrum, atypical for acute fracture, could  represent a projectional artifact, cyst, or area of bone erosion. Soft tissue swelling at ulnar border of mid to distal LEFT forearm into wrist. IMPRESSION: Mildly displaced distal LEFT ulnar diaphyseal fracture. Lucency at the triquetrum, atypical for acute fracture as above; recommend correlation for pain/tenderness at the ulnar border of the carpus; if patient has focal tenderness in this region, recommend dedicated LEFT wrist radiographs to exclude atypical fracture. Electronically Signed   By: Lavonia Dana M.D.   On: 11/10/2020 15:55    ____________________________________________  PROCEDURES   Procedure(s) performed (including Critical Care):  .Splint Application  Date/Time: 11/10/2020 8:06 PM Performed by: Duffy Bruce, MD Authorized by: Duffy Bruce, MD   Consent:    Consent obtained:  Verbal   Consent given by:  Patient   Risks, benefits, and alternatives were discussed: yes     Risks discussed:  Discoloration, numbness, pain and swelling   Alternatives discussed:  Alternative treatment Universal protocol:    Imaging studies available: yes     Immediately prior to procedure a time out was called: yes     Patient identity confirmed:  Verbally with patient Pre-procedure details:    Distal neurologic exam:  Normal   Distal perfusion: distal pulses strong   Procedure details:    Location:  Wrist   Wrist location:  R wrist   Cast type:  Short arm   Splint type:  Sugar tong   Attestation: Splint  applied and adjusted personally by me   Post-procedure details:    Distal neurologic exam:  Normal   Distal perfusion: distal pulses strong and brisk capillary refill     Procedure completion:  Tolerated well, no immediate complications   Post-procedure imaging: not applicable    ____________________________________________  INITIAL IMPRESSION / MDM / ASSESSMENT AND PLAN / ED COURSE  As part of my medical decision making, I reviewed the following data within the [place] notes reviewed and incorporated, Old chart reviewed, Notes from prior ED visits, and [place] Controlled Substance Database       *Meena S Gadsby was evaluated in Emergency Department on 11/10/2020 for the symptoms described in the history of present illness. She was evaluated in the context of the global COVID-19 pandemic, which necessitated consideration that the patient might be at risk for infection with the SARS-CoV-2 virus that causes COVID-19. Institutional protocols and algorithms that pertain to the evaluation of patients at risk for COVID-19 are in a state of rapid change based on information released by regulatory bodies including the CDC and federal and state organizations. These policies and algorithms were followed during the patient's care in the ED.  Some ED evaluations and interventions may be delayed as a result of limited staffing during the pandemic.*     Medical Decision Making: 75 year old female here with left wrist and left chest wall pain after fall 2 to 3 days ago.  No evidence of head injury.  Regarding her chest wall pain, lungs are clear with normal work of breathing.  She is satting well on room air.  Plain film showed no evidence of fracture, pneumothorax, hemothorax, or other complication.  Encouraged self splinting with a pillow and supportive care with analgesia.  Regarding her left forearm, she has a mildly displaced left ulnar diaphyseal fracture.  Her distal neurovascular is fully intact.  There is a questionable lucency at the triquetrium on the plain film, but she has no overt tenderness here on exam.  She has significant pain with any kind of movement of the arm.  No open wounds.  Compartments are soft.  She reportedly has had difficulty with hitting the arm as she has been having difficulty ambulating due to a recent left patellar injury that she is recovering from.  She was placed in a bulky sugar-tong splint to protect the fracture as well as  immobilize it.  This was extended partially to the hand in the event of possible occult triquetral fracture.  Patient will be sent for orthopedic follow-up with repeat imaging and further treatment as indicated.  ____________________________________________  FINAL CLINICAL IMPRESSION(S) / ED DIAGNOSES  Final diagnoses:  Injury  Closed fracture of distal end of left ulna, unspecified fracture morphology, initial encounter  Left-sided chest wall pain  Fall, initial encounter     MEDICATIONS GIVEN DURING THIS VISIT:  Medications - No data to display   ED Discharge Orders          Ordered    oxyCODONE-acetaminophen (PERCOCET) 5-325 MG tablet  Every 6 hours PRN        11/10/20 1917    ondansetron (ZOFRAN ODT) 4 MG disintegrating tablet  Every 8 hours PRN        11/10/20 1917             Note:  This document was prepared using Dragon voice recognition software and may include unintentional dictation errors.   Duffy Bruce, MD 11/10/20 2011

## 2020-11-10 NOTE — Discharge Instructions (Signed)
For your arm:  Do not hold anything in the hand until cleared by ortho  Keep the splint on throughout the day  Use the sling for comfort but try to keep your shoulder moving to prevent frozen shoulder  The current splint is a bulky splint to help with protecting the arm, and should be replaced at follow-up.

## 2020-11-10 NOTE — ED Triage Notes (Signed)
Pt to ED for left arm pain from fall a couple days ago. Reports fell d/t walking dog and dog tripping her. Bruising noted to left arm.  Also c/o left rib pain

## 2020-11-17 DIAGNOSIS — S82015A Nondisplaced osteochondral fracture of left patella, initial encounter for closed fracture: Secondary | ICD-10-CM | POA: Diagnosis not present

## 2020-11-17 DIAGNOSIS — S52209A Unspecified fracture of shaft of unspecified ulna, initial encounter for closed fracture: Secondary | ICD-10-CM | POA: Insufficient documentation

## 2020-11-17 DIAGNOSIS — S52202A Unspecified fracture of shaft of left ulna, initial encounter for closed fracture: Secondary | ICD-10-CM | POA: Diagnosis not present

## 2020-11-24 ENCOUNTER — Encounter: Payer: Self-pay | Admitting: Family Medicine

## 2020-12-01 ENCOUNTER — Encounter: Payer: Medicare Other | Admitting: Family Medicine

## 2020-12-08 ENCOUNTER — Telehealth: Payer: Self-pay

## 2020-12-08 DIAGNOSIS — H401131 Primary open-angle glaucoma, bilateral, mild stage: Secondary | ICD-10-CM | POA: Diagnosis not present

## 2020-12-08 NOTE — Telephone Encounter (Signed)
Copied from [place] [phone]. Topic: Appointment Scheduling - Scheduling Inquiry for Clinic >> Dec 08, 2020 11:56 AM Joan Baldwin E wrote: Reason for CRM: pt called and said she cant do mychart / she can do visit tomorrow with Dr. Caryn Section over the phone / phone number is in appt notes/ pt also needs to reschedule AWV please advise

## 2020-12-09 ENCOUNTER — Telehealth (INDEPENDENT_AMBULATORY_CARE_PROVIDER_SITE_OTHER): Payer: Medicare Other | Admitting: Family Medicine

## 2020-12-09 DIAGNOSIS — F419 Anxiety disorder, unspecified: Secondary | ICD-10-CM

## 2020-12-09 DIAGNOSIS — I10 Essential (primary) hypertension: Secondary | ICD-10-CM | POA: Diagnosis not present

## 2020-12-09 DIAGNOSIS — G8929 Other chronic pain: Secondary | ICD-10-CM | POA: Diagnosis not present

## 2020-12-09 DIAGNOSIS — K089 Disorder of teeth and supporting structures, unspecified: Secondary | ICD-10-CM | POA: Diagnosis not present

## 2020-12-09 MED ORDER — DIAZEPAM 10 MG PO TABS: ORAL_TABLET | ORAL | 1 refills | Status: DC

## 2020-12-09 MED ORDER — OMEPRAZOLE 20 MG PO CPDR: 20.0000 mg | DELAYED_RELEASE_CAPSULE | Freq: Every day | ORAL | 4 refills | Status: DC

## 2020-12-09 MED ORDER — AMLODIPINE BESYLATE 10 MG PO TABS: 10.0000 mg | ORAL_TABLET | Freq: Every day | ORAL | 3 refills | Status: DC

## 2020-12-09 MED ORDER — FLUOXETINE HCL 20 MG PO CAPS: 20.0000 mg | ORAL_CAPSULE | Freq: Every day | ORAL | 1 refills | Status: DC

## 2020-12-09 MED ORDER — LIDOCAINE VISCOUS HCL 2 % MT SOLN: OROMUCOSAL | 5 refills | Status: DC

## 2020-12-09 MED ORDER — LISINOPRIL 40 MG PO TABS: 40.0000 mg | ORAL_TABLET | Freq: Every day | ORAL | 0 refills | Status: DC

## 2020-12-09 NOTE — Progress Notes (Signed)
Virtual telephone visit    Virtual Visit via Telephone Note   This visit type was conducted due to national recommendations for restrictions regarding the COVID-19 Pandemic (e.g. social distancing) in an effort to limit this patient's exposure and mitigate transmission in our community. Due to her co-morbid illnesses, this patient is at least at moderate risk for complications without adequate follow up. This format is felt to be most appropriate for this patient at this time. The patient did not have access to video technology or had technical difficulties with video requiring transitioning to audio format only (telephone). Physical exam was limited to content and character of the telephone converstion.    Patient location: home Provider location: [facility]  I discussed the limitations of evaluation and management by telemedicine and the availability of in person appointments. The patient expressed understanding and agreed to proceed.   Visit Date: 12/09/2020  Today's healthcare provider: Lelon Huh, MD   No chief complaint on file.  Subjective    HPI  Anxiety, Follow-up  She was last seen for anxiety 3 months ago. Changes made at last visit include no changes.   She reports excellent compliance with treatment. She reports excellent tolerance of treatment. She is not having side effects.   She feels her anxiety is severe and Worse since last visit. Pt would like it increase her valium back to four times a day.   Symptoms: No chest pain Yes difficulty concentrating  No dizziness No fatigue  Yes feelings of losing control No insomnia  No irritable Yes palpitations  Yes panic attacks Yes racing thoughts  No shortness of breath No sweating  No tremors/shakes    GAD-7 Results GAD-7 Generalized Anxiety Disorder Screening Tool 01/08/2020  1. Feeling Nervous, Anxious, or on Edge 3  2. Not Being Able to Stop or Control Worrying 3  3. Worrying Too Much About Different Things  3  4. Trouble Relaxing 2  5. Being So Restless it's Hard To Sit Still 0  6. Becoming Easily Annoyed or Irritable 0  7. Feeling Afraid As If Something Awful Might Happen 3  Total GAD-7 Score 14  Difficulty At Work, Home, or Getting  Along With Others? Somewhat difficult    PHQ-9 Scores PHQ9 SCORE ONLY 08/19/2020 01/08/2020 08/14/2019  PHQ-9 Total Score 11 5 11     ---------------------------------------------------------------------------------------------------     Medications: Outpatient Medications Prior to Visit  Medication Sig   acetaminophen (TYLENOL) 500 MG tablet Take 500 mg by mouth every 8 (eight) hours as needed.    alendronate (FOSAMAX) 70 MG tablet TAKE 1 TABLET BY MOUTH EVERY 7 DAYS. TAKE WITH A FULL GLASS OF WATER ON EMPTY STOMACH   amLODipine (NORVASC) 10 MG tablet Take 1 tablet by mouth once daily   aspirin 81 MG tablet Take 1 tablet by mouth daily.   Calcium Carbonate-Vitamin D (CALCIUM 600/VITAMIN D PO) Take 1 tablet by mouth daily.    diazepam (VALIUM) 10 MG tablet Take 0.5-1 tablets (5-10 mg total) by mouth every 6 (six) hours as needed for anxiety.   Fiber POWD Take 2 scoop by mouth daily.   FLUoxetine (PROZAC) 20 MG capsule Take 1 capsule (20 mg total) by mouth daily. Take along with 40mg  capsule daily   FLUoxetine (PROZAC) 40 MG capsule Take 1 capsule (40 mg total) by mouth daily.   hydrochlorothiazide (HYDRODIURIL) 25 MG tablet Take 1 tablet by mouth once daily (Patient taking differently: As needed for fluid retention)   latanoprost (XALATAN) 0.005 %  ophthalmic solution Place 1 drop into both eyes at bedtime.   lidocaine (XYLOCAINE) 2 % solution USE AS DIRECTED 15ML IN MOUTH OR THROAT EVERY 4-6 HOURS AS NEEDED FOR MOUTH PAIN   lisinopril (ZESTRIL) 40 MG tablet Take 1 tablet by mouth once daily   Multiple Vitamins-Minerals (MULTIVITAMIN ADULT PO) Take 1 tablet by mouth daily.   Omega-3 Fatty Acids (FISH OIL) 1000 MG CAPS Take 1 capsule by mouth daily.  (Patient  not taking: Reported on 08/19/2020)   omeprazole (PRILOSEC) 20 MG capsule Take 1 capsule by mouth once daily   ondansetron (ZOFRAN ODT) 4 MG disintegrating tablet Take 1 tablet (4 mg total) by mouth every 8 (eight) hours as needed for nausea or vomiting.   oxyCODONE-acetaminophen (PERCOCET) 5-325 MG tablet Take 1-2 tablets by mouth every 6 (six) hours as needed for severe pain or moderate pain (no more than 6 tabs daily).   Timolol Maleate 0.5 % (DAILY) SOLN Place 1 drop into both eyes at bedtime.   traZODone (DESYREL) 100 MG tablet Take 1 tablet (100 mg total) by mouth at bedtime.   valACYclovir (VALTREX) 1000 MG tablet TAKE 2 TABLETS BY MOUTH TWICE DAILY FOR 1 DAY AS NEEDED FOR COLD SORES   No facility-administered medications prior to visit.    Review of Systems  Respiratory: Negative.    Cardiovascular:  Positive for palpitations. Negative for chest pain and leg swelling.  Psychiatric/Behavioral:  Positive for decreased concentration and dysphoric mood. Negative for self-injury, sleep disturbance and suicidal ideas. The patient is nervous/anxious.      Objective    There were no vitals taken for this visit.  Awake, alert, oriented x 3. In no apparent distress    Assessment & Plan     1. Anxiety Much worse lately  - diazepam (VALIUM) 10 MG tablet; TAKE 1/2 to 1 TABLET BY MOUTH 4 TIMES DAILY AS NEEDED FOR ANXIETY  Dispense: 120 tablet; Refill: 1  Refill FLUoxetine (PROZAC) 20 MG capsule; Take 1 capsule (20 mg total) by mouth daily. Take along with 40mg  capsule daily  Dispense: 90 capsule; Refill: 1  2. Essential (primary) hypertension refill - amLODipine (NORVASC) 10 MG tablet; Take 1 tablet (10 mg total) by mouth daily.  Dispense: 90 tablet; Refill: 3 - lisinopril (ZESTRIL) 40 MG tablet; Take 1 tablet (40 mg total) by mouth daily.  Dispense: 90 tablet; Refill: 0  Advised needs follow up within next month of two.   3. Chronic dental pain refill - lidocaine (XYLOCAINE) 2 %  solution; USE AS DIRECTED 15ML IN MOUTH OR THROAT EVERY 4-6 HOURS AS NEEDED FOR MOUTH PAIN  Dispense: 400 mL; Refill: 5     I discussed the assessment and treatment plan with the patient. The patient was provided an opportunity to ask questions and all were answered. The patient agreed with the plan and demonstrated an understanding of the instructions.   The patient was advised to call back or seek an in-person evaluation if the symptoms worsen or if the condition fails to improve as anticipated.  I provided 14 minutes of non-face-to-face time during this encounter.  The entirety of the information documented in the History of Present Illness, Review of Systems and Physical Exam were personally obtained by me. Portions of this information were initially documented by the CMA and reviewed by me for thoroughness and accuracy.    Lelon Huh, MD [facility] [phone] (phone) [phone] (fax)  [place]

## 2020-12-16 DIAGNOSIS — S52202A Unspecified fracture of shaft of left ulna, initial encounter for closed fracture: Secondary | ICD-10-CM | POA: Diagnosis not present

## 2021-01-20 ENCOUNTER — Emergency Department: Payer: Medicare Other

## 2021-01-20 ENCOUNTER — Other Ambulatory Visit: Payer: Self-pay

## 2021-01-20 ENCOUNTER — Emergency Department
Admission: EM | Admit: 2021-01-20 | Discharge: 2021-01-20 | Disposition: A | Discharge status: Home / Self Care | Payer: Medicare Other | Attending: Student in an Organized Health Care Education/Training Program | Admitting: Student in an Organized Health Care Education/Training Program

## 2021-01-20 DIAGNOSIS — W19XXXA Unspecified fall, initial encounter: Secondary | ICD-10-CM

## 2021-01-20 DIAGNOSIS — S20212A Contusion of left front wall of thorax, initial encounter: Secondary | ICD-10-CM | POA: Diagnosis not present

## 2021-01-20 DIAGNOSIS — I10 Essential (primary) hypertension: Secondary | ICD-10-CM | POA: Diagnosis not present

## 2021-01-20 DIAGNOSIS — R0781 Pleurodynia: Secondary | ICD-10-CM | POA: Diagnosis not present

## 2021-01-20 DIAGNOSIS — Y92009 Unspecified place in unspecified non-institutional (private) residence as the place of occurrence of the external cause: Secondary | ICD-10-CM | POA: Diagnosis not present

## 2021-01-20 DIAGNOSIS — S299XXA Unspecified injury of thorax, initial encounter: Secondary | ICD-10-CM | POA: Diagnosis not present

## 2021-01-20 DIAGNOSIS — W010XXA Fall on same level from slipping, tripping and stumbling without subsequent striking against object, initial encounter: Secondary | ICD-10-CM | POA: Diagnosis not present

## 2021-01-20 MED ORDER — LIDOCAINE 5 % EX PTCH
1.0000 | MEDICATED_PATCH | CUTANEOUS | Status: DC
  Administered 2021-01-20: 1 via TRANSDERMAL
  Filled 2021-01-20: qty 1

## 2021-01-20 MED ORDER — OXYCODONE HCL 5 MG PO CAPS: 5.0000 mg | ORAL_CAPSULE | Freq: Three times a day (TID) | ORAL | 0 refills | Status: DC | PRN

## 2021-01-20 MED ORDER — LIDOCAINE 5 % EX PTCH: 1.0000 | MEDICATED_PATCH | Freq: Two times a day (BID) | CUTANEOUS | 0 refills | Status: AC

## 2021-01-20 MED ORDER — OXYCODONE-ACETAMINOPHEN 5-325 MG PO TABS
1.0000 | ORAL_TABLET | Freq: Once | ORAL | Status: AC
  Administered 2021-01-20: 1 via ORAL
  Filled 2021-01-20: qty 1

## 2021-01-20 NOTE — ED Provider Notes (Signed)
[facility] Provider Note    Event Date/Time   First MD Initiated Contact with Patient 01/20/21 1339     (approximate)   History   Rib Injury   HPI  Joan Baldwin is a 76 y.o. female   presents to the ED with complaint of left rib pain since falling 10 days ago.  Patient states that she tripped for the gravel and concrete.  She denies any head injury or loss of consciousness.  She has however continued to have rib pain despite taking Tylenol.  Patient states she has a walker at home but does not use it.  Patient has a history of balance problems, cataracts, depression, glaucoma, hepatitis, hypertension and osteoporosis.  She also has some reflux disease and has stayed away from anti-inflammatories.      Physical Exam   Triage Vital Signs: ED Triage Vitals [01/20/21 1255]  Enc Vitals Group     BP (!) 116/98     Pulse Rate 100     Resp 18     Temp 98.4 F (36.9 C)     Temp Source Oral     SpO2 100 %     Weight      Height      Head Circumference      Peak Flow      Pain Score      Pain Loc      Pain Edu?      Excl. in [place]?     Most recent vital signs: Vitals:   01/20/21 1255  BP: (!) 116/98  Pulse: 100  Resp: 18  Temp: 98.4 F (36.9 C)  SpO2: 100%     General: Awake, no distress.  Talkative, oriented, able to ambulate without any assistance. CV:  Good peripheral perfusion.  Heart regular rate and rhythm. Resp:  Normal effort.  Lungs are clear bilaterally. Abd:  No distention.  Abdomen soft, flat nontender Other:  On examination of the left ribs there is no gross deformity and no soft tissue edema or skin injury noted.  Left lateral ribs approximately 7, eighth and ninth are extremely tender to palpation.  No tenderness is noted on palpation of the thoracic spine.  Patient is able to move left shoulder without any difficulty and no crepitus is noted.  Patient does have some healing superficial abrasions to her forearm from previous  fall.   ED Results / Procedures / Treatments   Labs (all labs ordered are listed, but only abnormal results are displayed) Labs Reviewed - No data to display    RADIOLOGY  Left x-ray was reviewed and radiologist report.  No acute bony injury is noted.  No acute cardiopulmonary disease present.  PROCEDURES:  Critical Care performed: No  Procedures   MEDICATIONS ORDERED IN ED: Medications  lidocaine (LIDODERM) 5 % 1 patch (1 patch Transdermal Patch Applied 01/20/21 1437)  oxyCODONE-acetaminophen (PERCOCET/ROXICET) 5-325 MG per tablet 1 tablet (1 tablet Oral Given 01/20/21 1458)     IMPRESSION / MDM / ASSESSMENT AND PLAN / ED COURSE  I reviewed the triage vital signs and the nursing notes.                              Differential diagnosis includes, but is not limited to, contusion left ribs, rib fracture, skin contusion.  76 year old female presents to the ED with complaint of a fall that occurred 10 days ago.  Patient states  that this was a fall due to her tripping on gravel and concrete.  This was not a syncopal episode and she denies any head injury or loss of consciousness.  Patient does have a history of balance problems, cataract, glaucoma, hypertension, osteoporosis, anxiety and hepatitis.  She states that she has been taking Tylenol at home without any relief of her pain.  On exam she was moderately tender to palpation left lateral ribs without obvious deformity or discoloration.  X-rays were reviewed and no acute fracture was noted.  Patient was insistent that she be given a pain medication as she cannot sleep at night due to the pain.  A Lidoderm patch was applied to her ribs and a prescription for the same was sent to the pharmacy.  Patient persisted and a prescription for oxycodone IR 5 mg was sent to the pharmacy 1 every 8 hours as needed for severe pain #6.  She is encouraged to follow-up with her PCP for any continued pain medication.  We also discussed at length the  use of a walker which she has at home but refuses to use.  With her history of balance issues she should be using this to help prevent any falls especially if she is outside.   FINAL CLINICAL IMPRESSION(S) / ED DIAGNOSES   Final diagnoses:  Contusion of ribs, left, initial encounter  Fall at home, initial encounter     Rx / DC Orders   ED Discharge Orders          Ordered    lidocaine (LIDODERM) 5 %  Every 12 hours        01/20/21 1435    oxycodone (OXY-IR) 5 MG capsule  Every 8 hours PRN        01/20/21 1453             Note:  This document was prepared using Dragon voice recognition software and may include unintentional dictation errors.   Johnn Hai, PA-C 01/20/21 1551    Merlyn Lot, MD 01/21/21 1027

## 2021-01-20 NOTE — ED Triage Notes (Signed)
Pt c/o left rib pain since falling about 10 days ago

## 2021-01-20 NOTE — Discharge Instructions (Signed)
Follow-up with your primary care provider if any continued problems or not improving.  You may also apply ice to your ribs to help with discomfort.  Continue with your regular medication.  X-rays did not show any fractures.  It is important that you use your walker since you do have a history of balance issues.  This may prevent you from falling again.

## 2021-01-31 ENCOUNTER — Other Ambulatory Visit: Payer: Self-pay | Admitting: Family Medicine

## 2021-01-31 DIAGNOSIS — F419 Anxiety disorder, unspecified: Secondary | ICD-10-CM

## 2021-01-31 NOTE — Telephone Encounter (Signed)
Requested medication (s) are due for refill today: yes  Requested medication (s) are on the active medication list: yes  Last refill:  12/09/20 #120  Future visit scheduled: yes  Notes to clinic:  med not delegated to NT to RF   Requested Prescriptions  Pending Prescriptions Disp Refills   diazepam (VALIUM) 10 MG tablet [Pharmacy Med Name: diazePAM 10 MG Oral Tablet] 120 tablet 0    Sig: TAKE 1/2 TO 1 (ONE-HALF TO ONE) TABLET BY MOUTH 4 TIMES DAILY AS NEEDED FOR ANXIETY     Not Delegated - Psychiatry:  Anxiolytics/Hypnotics Failed - 01/31/2021 12:04 PM      Failed - This refill cannot be delegated      Failed - Urine Drug Screen completed in last 360 days      Passed - Valid encounter within last 6 months    Recent Outpatient Visits           1 month ago [place], Donald E, MD   5 months ago [place], Donald E, MD   9 months ago [place], Donald E, MD   1 year ago Insomnia, unspecified type   [facility] Birdie Sons, MD   1 year ago [place], Donald E, MD       Future Appointments             In 3 weeks Fisher, Kirstie Peri, MD [facility] [place]

## 2021-02-04 ENCOUNTER — Emergency Department (EMERGENCY_DEPARTMENT_HOSPITAL)
Admission: EM | Admit: 2021-02-04 | Discharge: 2021-02-05 | Disposition: A | Discharge status: Other Acute Inpatient Hospital | Payer: Medicare Other | Source: Home / Self Care | Attending: Emergency Medicine | Admitting: Emergency Medicine

## 2021-02-04 ENCOUNTER — Other Ambulatory Visit: Payer: Self-pay

## 2021-02-04 DIAGNOSIS — Z79899 Other long term (current) drug therapy: Secondary | ICD-10-CM | POA: Insufficient documentation

## 2021-02-04 DIAGNOSIS — Z20822 Contact with and (suspected) exposure to covid-19: Secondary | ICD-10-CM | POA: Insufficient documentation

## 2021-02-04 DIAGNOSIS — F419 Anxiety disorder, unspecified: Secondary | ICD-10-CM | POA: Diagnosis not present

## 2021-02-04 DIAGNOSIS — Z87891 Personal history of nicotine dependence: Secondary | ICD-10-CM

## 2021-02-04 DIAGNOSIS — F339 Major depressive disorder, recurrent, unspecified: Secondary | ICD-10-CM | POA: Diagnosis not present

## 2021-02-04 DIAGNOSIS — Z7982 Long term (current) use of aspirin: Secondary | ICD-10-CM | POA: Insufficient documentation

## 2021-02-04 DIAGNOSIS — R7303 Prediabetes: Secondary | ICD-10-CM | POA: Diagnosis present

## 2021-02-04 DIAGNOSIS — R443 Hallucinations, unspecified: Secondary | ICD-10-CM | POA: Insufficient documentation

## 2021-02-04 DIAGNOSIS — I10 Essential (primary) hypertension: Secondary | ICD-10-CM | POA: Insufficient documentation

## 2021-02-04 DIAGNOSIS — G4700 Insomnia, unspecified: Secondary | ICD-10-CM | POA: Diagnosis present

## 2021-02-04 DIAGNOSIS — K219 Gastro-esophageal reflux disease without esophagitis: Secondary | ICD-10-CM | POA: Diagnosis present

## 2021-02-04 LAB — COMPREHENSIVE METABOLIC PANEL
ALT: 14 U/L (ref 0–44)
AST: 22 U/L (ref 15–41)
Albumin: 4.1 g/dL (ref 3.5–5.0)
Alkaline Phosphatase: 75 U/L (ref 38–126)
Anion gap: 7 (ref 5–15)
BUN: 15 mg/dL (ref 8–23)
CO2: 28 mmol/L (ref 22–32)
Calcium: 10.3 mg/dL (ref 8.9–10.3)
Chloride: 103 mmol/L (ref 98–111)
Creatinine, Ser: 0.79 mg/dL (ref 0.44–1.00)
GFR, Estimated: >60 mL/min (ref >60)
Glucose, Bld: 130 mg/dL — ABNORMAL HIGH (ref 70–99)
Potassium: 4.2 mmol/L (ref 3.5–5.1)
Sodium: 138 mmol/L (ref 135–145)
Total Bilirubin: 0.3 mg/dL (ref 0.3–1.2)
Total Protein: 7.7 g/dL (ref 6.5–8.1)

## 2021-02-04 LAB — CBC
HCT: 43.7 % (ref 36.0–46.0)
Hemoglobin: 13.9 g/dL (ref 12.0–15.0)
MCH: 30.9 pg (ref 26.0–34.0)
MCHC: 31.8 g/dL (ref 30.0–36.0)
MCV: 97.1 fL (ref 80.0–100.0)
Platelets: 540 10*3/uL — ABNORMAL HIGH (ref 150–400)
RBC: 4.5 MIL/uL (ref 3.87–5.11)
RDW: 12.9 % (ref 11.5–15.5)
WBC: 9 10*3/uL (ref 4.0–10.5)
nRBC: 0 % (ref 0.0–0.2)

## 2021-02-04 LAB — ETHANOL: Alcohol, Ethyl (B): <10 mg/dL (ref <10)

## 2021-02-04 LAB — SALICYLATE LEVEL: Salicylate Lvl: <7 mg/dL — ABNORMAL LOW (ref 7.0–30.0)

## 2021-02-04 LAB — RESP PANEL BY RT-PCR (FLU A&B, COVID) ARPGX2
Influenza A by PCR: NEGATIVE
Influenza B by PCR: NEGATIVE
SARS Coronavirus 2 by RT PCR: NEGATIVE

## 2021-02-04 LAB — ACETAMINOPHEN LEVEL: Acetaminophen (Tylenol), Serum: <10 ug/mL — ABNORMAL LOW (ref 10–30)
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
## 2021-02-04 NOTE — ED Notes (Signed)
PATIENT ARRIVED WITH THE FOLLOWING:  GREY HOODIE  BLUE LONG SLEEVE SHIRT  BLUE PANTS  WHITE SOCKS  WHITE TENNIS SHOES  PATIENT DRESSED OUT WITH THIS [facility] AND KAT AND Therapist, music IN ROOM

## 2021-02-05 ENCOUNTER — Inpatient Hospital Stay
Admission: AD | Admit: 2021-02-05 | Discharge: 2021-02-10 | DRG: 885 | Disposition: A | Discharge status: Home / Self Care | Payer: Medicare Other | Source: Intra-hospital | Attending: Psychiatry | Admitting: Psychiatry

## 2021-02-05 ENCOUNTER — Encounter: Payer: Self-pay | Admitting: Psychiatry

## 2021-02-05 DIAGNOSIS — Z20822 Contact with and (suspected) exposure to covid-19: Secondary | ICD-10-CM | POA: Diagnosis present

## 2021-02-05 DIAGNOSIS — F339 Major depressive disorder, recurrent, unspecified: Principal | ICD-10-CM | POA: Diagnosis present

## 2021-02-05 DIAGNOSIS — I10 Essential (primary) hypertension: Secondary | ICD-10-CM | POA: Diagnosis present

## 2021-02-05 DIAGNOSIS — Z87891 Personal history of nicotine dependence: Secondary | ICD-10-CM | POA: Diagnosis not present

## 2021-02-05 DIAGNOSIS — Z7983 Long term (current) use of bisphosphonates: Secondary | ICD-10-CM

## 2021-02-05 DIAGNOSIS — F419 Anxiety disorder, unspecified: Secondary | ICD-10-CM

## 2021-02-05 DIAGNOSIS — Z79899 Other long term (current) drug therapy: Secondary | ICD-10-CM | POA: Diagnosis not present

## 2021-02-05 DIAGNOSIS — G4700 Insomnia, unspecified: Secondary | ICD-10-CM

## 2021-02-05 LAB — URINALYSIS, ROUTINE W REFLEX MICROSCOPIC
Bacteria, UA: NONE SEEN
Bilirubin Urine: NEGATIVE
Glucose, UA: NEGATIVE mg/dL
Hgb urine dipstick: NEGATIVE
Ketones, ur: NEGATIVE mg/dL
Leukocytes,Ua: NEGATIVE
Nitrite: NEGATIVE
Protein, ur: NEGATIVE mg/dL
Specific Gravity, Urine: <1.005 — ABNORMAL LOW (ref 1.005–1.030)
pH: 6 (ref 5.0–8.0)

## 2021-02-05 LAB — URINE DRUG SCREEN, QUALITATIVE (ARMC ONLY)
Amphetamines, Ur Screen: NOT DETECTED
Barbiturates, Ur Screen: NOT DETECTED
Benzodiazepine, Ur Scrn: POSITIVE — AB
Cannabinoid 50 Ng, Ur ~~LOC~~: NOT DETECTED
Cocaine Metabolite,Ur ~~LOC~~: NOT DETECTED
MDMA (Ecstasy)Ur Screen: NOT DETECTED
Methadone Scn, Ur: NOT DETECTED
Opiate, Ur Screen: POSITIVE — AB
Phencyclidine (PCP) Ur S: NOT DETECTED
Tricyclic, Ur Screen: POSITIVE — AB

## 2021-02-05 MED ORDER — FLUOXETINE HCL 20 MG PO CAPS
20.0000 mg | ORAL_CAPSULE | Freq: Every day | ORAL | Status: DC
  Filled 2021-02-05: qty 1

## 2021-02-05 MED ORDER — PANTOPRAZOLE SODIUM 40 MG PO TBEC
40.0000 mg | DELAYED_RELEASE_TABLET | Freq: Every day | ORAL | Status: DC
  Administered 2021-02-05: 40 mg via ORAL
  Filled 2021-02-05: qty 1

## 2021-02-05 MED ORDER — FLUOXETINE HCL 20 MG PO CAPS
60.0000 mg | ORAL_CAPSULE | Freq: Every day | ORAL | Status: DC
  Administered 2021-02-05: 60 mg via ORAL
  Filled 2021-02-05: qty 3

## 2021-02-05 MED ORDER — PANTOPRAZOLE SODIUM 40 MG PO TBEC
40.0000 mg | DELAYED_RELEASE_TABLET | Freq: Every day | ORAL | Status: DC
  Administered 2021-02-06 – 2021-02-10 (×5): 40 mg via ORAL
  Filled 2021-02-05 (×5): qty 1

## 2021-02-05 MED ORDER — MAGNESIUM HYDROXIDE 400 MG/5ML PO SUSP
30.0000 mL | Freq: Every day | ORAL | Status: DC | PRN
  Administered 2021-02-08 – 2021-02-09 (×2): 30 mL via ORAL
  Filled 2021-02-05 (×2): qty 30

## 2021-02-05 MED ORDER — ACETAMINOPHEN 325 MG PO TABS: 650.0000 mg | ORAL_TABLET | Freq: Four times a day (QID) | ORAL | Status: DC | PRN

## 2021-02-05 MED ORDER — ASPIRIN 81 MG PO CHEW
81.0000 mg | CHEWABLE_TABLET | Freq: Every day | ORAL | Status: DC
  Administered 2021-02-06 – 2021-02-10 (×5): 81 mg via ORAL
  Filled 2021-02-05 (×5): qty 1

## 2021-02-05 MED ORDER — LISINOPRIL 20 MG PO TABS
40.0000 mg | ORAL_TABLET | Freq: Every day | ORAL | Status: DC
  Administered 2021-02-06 – 2021-02-10 (×5): 40 mg via ORAL
  Filled 2021-02-05 (×5): qty 2

## 2021-02-05 MED ORDER — ALUM & MAG HYDROXIDE-SIMETH 200-200-20 MG/5ML PO SUSP: 30.0000 mL | ORAL | Status: DC | PRN

## 2021-02-05 MED ORDER — DIAZEPAM 5 MG PO TABS
5.0000 mg | ORAL_TABLET | Freq: Four times a day (QID) | ORAL | Status: DC | PRN
  Administered 2021-02-05 – 2021-02-10 (×13): 5 mg via ORAL
  Filled 2021-02-05 (×14): qty 1

## 2021-02-05 MED ORDER — CALCIUM CARBONATE ANTACID 500 MG PO CHEW
1.0000 | CHEWABLE_TABLET | Freq: Once | ORAL | Status: AC
  Administered 2021-02-05: 200 mg via ORAL
  Filled 2021-02-05: qty 1

## 2021-02-05 MED ORDER — DIAZEPAM 5 MG PO TABS
5.0000 mg | ORAL_TABLET | Freq: Four times a day (QID) | ORAL | Status: DC | PRN
  Administered 2021-02-05: 5 mg via ORAL
  Filled 2021-02-05: qty 1

## 2021-02-05 MED ORDER — DIAZEPAM 5 MG PO TABS
5.0000 mg | ORAL_TABLET | Freq: Four times a day (QID) | ORAL | Status: DC | PRN
  Filled 2021-02-05: qty 2

## 2021-02-05 MED ORDER — TRAZODONE HCL 50 MG PO TABS
100.0000 mg | ORAL_TABLET | Freq: Every day | ORAL | Status: DC
  Administered 2021-02-05 – 2021-02-09 (×5): 100 mg via ORAL
  Filled 2021-02-05 (×5): qty 2

## 2021-02-05 MED ORDER — TRAZODONE HCL 100 MG PO TABS: 100.0000 mg | ORAL_TABLET | Freq: Every day | ORAL | Status: DC

## 2021-02-05 MED ORDER — LISINOPRIL 10 MG PO TABS
40.0000 mg | ORAL_TABLET | Freq: Every day | ORAL | Status: DC
  Administered 2021-02-05: 40 mg via ORAL
  Filled 2021-02-05: qty 4

## 2021-02-05 MED ORDER — AMLODIPINE BESYLATE 5 MG PO TABS
10.0000 mg | ORAL_TABLET | Freq: Every day | ORAL | Status: DC
  Administered 2021-02-06 – 2021-02-10 (×5): 10 mg via ORAL
  Filled 2021-02-05 (×5): qty 2

## 2021-02-05 MED ORDER — ASPIRIN EC 81 MG PO TBEC
81.0000 mg | DELAYED_RELEASE_TABLET | Freq: Every day | ORAL | Status: DC
  Administered 2021-02-05: 81 mg via ORAL
  Filled 2021-02-05: qty 1

## 2021-02-05 MED ORDER — AMLODIPINE BESYLATE 5 MG PO TABS
10.0000 mg | ORAL_TABLET | Freq: Every day | ORAL | Status: DC
  Administered 2021-02-05: 10 mg via ORAL
  Filled 2021-02-05: qty 2

## 2021-02-05 NOTE — ED Notes (Signed)
Taken to [employer] by [facility] and ED tech via wheelchair. All of belongings sent with patient INCLUDING top dentures currently in pt mouth. Family updated as requested by pt
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
## 2021-02-05 NOTE — ED Notes (Signed)
Pt brushed teeth, changed pants and socks.

## 2021-02-05 NOTE — ED Notes (Signed)
Breakfast given.  
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
## 2021-02-05 NOTE — Tx Team (Signed)
Initial Treatment Plan 02/05/2021 5:01 PM Joan Baldwin XID:[id number]    PATIENT STRESSORS: Marital or family conflict     PATIENT STRENGTHS: Marketing executive fund of knowledge    PATIENT IDENTIFIED PROBLEMS: Patient doesn't know why she is here                      DISCHARGE CRITERIA:  Safe-care adequate arrangements made  PRELIMINARY DISCHARGE PLAN: Return to previous living arrangement  PATIENT/FAMILY INVOLVEMENT: This treatment plan has been presented to and reviewed with the patient, [name].  The patient and family have been given the opportunity to ask questions and make suggestions.  Nolon Bussing, RN 02/05/2021, 5:01 PM

## 2021-02-05 NOTE — ED Notes (Signed)
Lunch given.

## 2021-02-05 NOTE — ED Notes (Addendum)
Pt daughter-in-law at bedside visiting with patient. Pt daughter in law accusing hospital treating pt "like a cattle in a hallway". Taking pictures of patient in hospital with security at side. Pt visitor was instructed to delete picture from phone by security and did. RN interpreted visitor's dissatisfaction with care.  With permission from pt, RN gave update to family member and answered questions regarding process, medications given and plan of care.  Family member appears satisfied with answers provided about pt's care. RN gave copy of rules in ED as well as hours for visiting and phone calls.  Visit completed.

## 2021-02-05 NOTE — ED Notes (Signed)
Report to [place], Therapist, sports. Pt to go to geropsych unit, room 28

## 2021-02-05 NOTE — Plan of Care (Signed)
?  Problem: Education: ?Goal: Mental status will improve ?Outcome: Progressing ?Goal: Verbalization of understanding the information provided will improve ?Outcome: Progressing ?  ?
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
## 2021-02-05 NOTE — Progress Notes (Signed)
Patient pleasant and cooperative. Was isolative to self and room. Denies any SI, HI, AVH. Voiced no complaints or concerns. Prn given for sleep with good relief.  Encouragement and support provided. Safety checks maintained. Medications given as prescribed. Pt receptive and remains safe on unit with q 15 min checks.

## 2021-02-06 DIAGNOSIS — F339 Major depressive disorder, recurrent, unspecified: Principal | ICD-10-CM

## 2021-02-06 MED ORDER — QUETIAPINE FUMARATE 25 MG PO TABS
50.0000 mg | ORAL_TABLET | Freq: Every day | ORAL | Status: DC
  Administered 2021-02-06 – 2021-02-09 (×4): 50 mg via ORAL
  Filled 2021-02-06 (×4): qty 2

## 2021-02-06 MED ORDER — FLUOXETINE HCL 20 MG PO CAPS
20.0000 mg | ORAL_CAPSULE | Freq: Every day | ORAL | Status: DC
  Administered 2021-02-06 – 2021-02-10 (×5): 20 mg via ORAL
  Filled 2021-02-06 (×5): qty 1
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
## 2021-02-06 NOTE — Progress Notes (Signed)
Recreation Therapy Notes  INPATIENT RECREATION THERAPY ASSESSMENT  Patient Details Name: Joan Baldwin MRN: [id number] DOB: 03/12/1945 Today's Date: 02/06/2021       Information Obtained From: Patient (Patient refused)  Able to Participate in Assessment/Interview:    Patient Presentation:    Reason for Admission (Per Patient):    Patient Stressors:    Coping Skills:      Leisure Interests (2+):     Frequency of Recreation/Participation:    Awareness of Community Resources:     [employer]:     Current Use:    If no, Barriers?:    Expressed Interest in [place] of Residence:     Patient Main Form of Transportation:    Patient Strengths:     Patient Identified Areas of Improvement:     Patient Goal for Hospitalization:     Current SI (including self-harm):     Current HI:     Current AVH:    Staff Intervention Plan:    Consent to Intern Participation:    Eldo Umanzor 02/06/2021, 3:18 PM

## 2021-02-06 NOTE — BH IP Treatment Plan (Signed)
Interdisciplinary Treatment and Diagnostic Plan Update  02/06/2021 Time of Session: 11:00AM Joan Baldwin MRN: [id number]  Principal Diagnosis: <principal problem not specified>  Secondary Diagnoses: Active Problems:   Major depression, recurrent, chronic (HCC)   Current Medications:  Current Facility-Administered Medications  Medication Dose Route Frequency Provider Last Rate Last Admin   acetaminophen (TYLENOL) tablet 650 mg  650 mg Oral Q6H PRN Sherlon Handing, NP       alum & mag hydroxide-simeth (MAALOX/MYLANTA) 200-200-20 MG/5ML suspension 30 mL  30 mL Oral Q4H PRN Waldon Merl F, NP       amLODipine (NORVASC) tablet 10 mg  10 mg Oral Daily Waldon Merl F, NP   10 mg at 02/06/21 3536   aspirin chewable tablet 81 mg  81 mg Oral Daily Waldon Merl F, NP   81 mg at 02/06/21 0929   diazepam (VALIUM) tablet 5 mg  5 mg Oral Q6H PRN Darnelle Bos, RPH   5 mg at 02/06/21 1443   FLUoxetine (PROZAC) capsule 20 mg  20 mg Oral Daily Parks Ranger, DO       lisinopril (ZESTRIL) tablet 40 mg  40 mg Oral Daily Waldon Merl F, NP   40 mg at 02/06/21 1540   magnesium hydroxide (MILK OF MAGNESIA) suspension 30 mL  30 mL Oral Daily PRN Sherlon Handing, NP       pantoprazole (PROTONIX) EC tablet 40 mg  40 mg Oral Daily Waldon Merl F, NP   40 mg at 02/06/21 0867   QUEtiapine (SEROQUEL) tablet 50 mg  50 mg Oral QHS Parks Ranger, DO       traZODone (DESYREL) tablet 100 mg  100 mg Oral QHS Waldon Merl F, NP   100 mg at 02/05/21 2103   PTA Medications: Medications Prior to Admission  Medication Sig Dispense Refill Last Dose   acetaminophen (TYLENOL) 500 MG tablet Take 500 mg by mouth every 8 (eight) hours as needed.       alendronate (FOSAMAX) 70 MG tablet TAKE 1 TABLET BY MOUTH EVERY 7 DAYS. TAKE WITH A FULL GLASS OF WATER ON EMPTY STOMACH 12 tablet 12    amLODipine (NORVASC) 10 MG tablet Take 1 tablet (10 mg total) by mouth daily. 90 tablet 3     aspirin 81 MG tablet Take 1 tablet by mouth daily.      Calcium Carbonate-Vitamin D (CALCIUM 600/VITAMIN D PO) Take 1 tablet by mouth daily.       diazepam (VALIUM) 10 MG tablet Take 0.5-1 tablets (5-10 mg total) by mouth every 6 (six) hours as needed for anxiety. 60 tablet 0    diazepam (VALIUM) 10 MG tablet TAKE 1/2 TO 1 (ONE-HALF TO ONE) TABLET BY MOUTH 4 TIMES DAILY AS NEEDED FOR ANXIETY 120 tablet 5    Fiber POWD Take 2 scoop by mouth daily.      FLUoxetine (PROZAC) 20 MG capsule Take 1 capsule (20 mg total) by mouth daily. Take along with 37m capsule daily 90 capsule 1    FLUoxetine (PROZAC) 40 MG capsule Take 1 capsule (40 mg total) by mouth daily. 90 capsule 2    hydrochlorothiazide (HYDRODIURIL) 25 MG tablet Take 1 tablet by mouth once daily (Patient not taking: Reported on 12/09/2020) 90 tablet 1    latanoprost (XALATAN) 0.005 % ophthalmic solution Place 1 drop into both eyes at bedtime.      lidocaine (LIDODERM) 5 % Place 1 patch onto the skin every 12 (twelve) hours. Remove &  Discard patch within 12 hours or as directed by MD 10 patch 0    lidocaine (XYLOCAINE) 2 % solution USE AS DIRECTED 15ML IN MOUTH OR THROAT EVERY 4-6 HOURS AS NEEDED FOR MOUTH PAIN 400 mL 5    lisinopril (ZESTRIL) 40 MG tablet Take 1 tablet (40 mg total) by mouth daily. 90 tablet 0    Multiple Vitamins-Minerals (MULTIVITAMIN ADULT PO) Take 1 tablet by mouth daily.      Omega-3 Fatty Acids (FISH OIL) 1000 MG CAPS Take 1 capsule by mouth daily.  (Patient not taking: Reported on 12/09/2020)      omeprazole (PRILOSEC) 20 MG capsule Take 1 capsule (20 mg total) by mouth daily. 90 capsule 4    ondansetron (ZOFRAN ODT) 4 MG disintegrating tablet Take 1 tablet (4 mg total) by mouth every 8 (eight) hours as needed for nausea or vomiting. (Patient not taking: Reported on 12/09/2020) 20 tablet 0    oxycodone (OXY-IR) 5 MG capsule Take 1 capsule (5 mg total) by mouth every 8 (eight) hours as needed for pain. (Patient not  taking: Reported on 02/05/2021) 6 capsule 0    Timolol Maleate 0.5 % (DAILY) SOLN Place 1 drop into both eyes at bedtime.      traZODone (DESYREL) 100 MG tablet Take 1 tablet (100 mg total) by mouth at bedtime. 30 tablet 2    valACYclovir (VALTREX) 1000 MG tablet TAKE 2 TABLETS BY MOUTH TWICE DAILY FOR 1 DAY AS NEEDED FOR COLD SORES 16 tablet 5     Patient Stressors: Marital or family conflict    Patient Strengths: Marketing executive fund of knowledge   Treatment Modalities: Medication Management, Group therapy, Case management,  1 to 1 session with clinician, Psychoeducation, Recreational therapy.   Physician Treatment Plan for Primary Diagnosis: <principal problem not specified> Long Term Goal(s): Improvement in symptoms so as ready for discharge   Short Term Goals: Ability to identify changes in lifestyle to reduce recurrence of condition will improve Ability to verbalize feelings will improve Ability to disclose and discuss suicidal ideas Ability to demonstrate self-control will improve Ability to identify and develop effective coping behaviors will improve Ability to maintain clinical measurements within normal limits will improve Compliance with prescribed medications will improve Ability to identify triggers associated with substance abuse/mental health issues will improve  Medication Management: Evaluate patient's response, side effects, and tolerance of medication regimen.  Therapeutic Interventions: 1 to 1 sessions, Unit Group sessions and Medication administration.  Evaluation of Outcomes: Not Met  Physician Treatment Plan for Secondary Diagnosis: Active Problems:   Major depression, recurrent, chronic (HCC)  Long Term Goal(s): Improvement in symptoms so as ready for discharge   Short Term Goals: Ability to identify changes in lifestyle to reduce recurrence of condition will improve Ability to verbalize feelings will improve Ability to disclose and discuss  suicidal ideas Ability to demonstrate self-control will improve Ability to identify and develop effective coping behaviors will improve Ability to maintain clinical measurements within normal limits will improve Compliance with prescribed medications will improve Ability to identify triggers associated with substance abuse/mental health issues will improve     Medication Management: Evaluate patient's response, side effects, and tolerance of medication regimen.  Therapeutic Interventions: 1 to 1 sessions, Unit Group sessions and Medication administration.  Evaluation of Outcomes: Not Met   RN Treatment Plan for Primary Diagnosis: <principal problem not specified> Long Term Goal(s): Knowledge of disease and therapeutic regimen to maintain health will improve  Short Term Goals:  Ability to remain free from injury will improve, Ability to verbalize frustration and anger appropriately will improve, Ability to demonstrate self-control, Ability to participate in decision making will improve, Ability to verbalize feelings will improve, and Ability to identify and develop effective coping behaviors will improve  Medication Management: RN will administer medications as ordered by provider, will assess and evaluate patient's response and provide education to patient for prescribed medication. RN will report any adverse and/or side effects to prescribing provider.  Therapeutic Interventions: 1 on 1 counseling sessions, Psychoeducation, Medication administration, Evaluate responses to treatment, Monitor vital signs and CBGs as ordered, Perform/monitor CIWA, COWS, AIMS and Fall Risk screenings as ordered, Perform wound care treatments as ordered.  Evaluation of Outcomes: Not Met   LCSW Treatment Plan for Primary Diagnosis: <principal problem not specified> Long Term Goal(s): Safe transition to appropriate next level of care at discharge, Engage patient in therapeutic group addressing interpersonal  concerns.  Short Term Goals: Engage patient in aftercare planning with referrals and resources, Increase social support, Increase ability to appropriately verbalize feelings, Increase emotional regulation, Identify triggers associated with mental health/substance abuse issues, and Increase skills for wellness and recovery  Therapeutic Interventions: Assess for all discharge needs, 1 to 1 time with Social worker, Explore available resources and support systems, Assess for adequacy in community support network, Educate family and significant other(s) on suicide prevention, Complete Psychosocial Assessment, Interpersonal group therapy.  Evaluation of Outcomes: Not Met   Progress in Treatment: Attending groups: No. Participating in groups: No. Taking medication as prescribed: Yes. Toleration medication: Yes. Family/Significant other contact made: No, will contact:  when given permission Patient understands diagnosis: Yes. Discussing patient identified problems/goals with staff: Yes. Medical problems stabilized or resolved: Yes. Denies suicidal/homicidal ideation: Yes. Issues/concerns per patient self-inventory: No. Other: None  New problem(s) identified: No, Describe:  None  New Short Term/Long Term Goal(s): Patient to work towards  medication management for mood stabilization;  development of comprehensive mental wellness plan.   Patient Goals:  "go home."  Discharge Plan or Barriers: CSW will assist pt with development of appropriate discharge/aftercare plan.  Reason for Continuation of Hospitalization: Aggression Anxiety Medication stabilization  Estimated Length of Stay: TBD   Scribe for Treatment Team: Regnald Bowens A [place], LCSWA 02/06/2021 12:32 PM

## 2021-02-06 NOTE — Progress Notes (Signed)
Recreation Therapy Notes  INPATIENT RECREATION TR PLAN  Patient Details Name: Joan Baldwin MRN: [id number] DOB: 11/09/1945 Today's Date: 02/06/2021  Rec Therapy Plan Is patient appropriate for Therapeutic Recreation?: Yes Treatment times per week: at least 3 Estimated Length of Stay: 5-7 days TR Treatment/Interventions: Group participation (Comment)  Discharge Criteria Pt will be discharged from therapy if:: Discharged  Discharge Summary     Gurshan Settlemire  [place] 02/06/2021, 3:19 PM

## 2021-02-06 NOTE — Progress Notes (Signed)
Recreation Therapy Notes  Date: 02/06/2021  Time: 1:15pm    Location: Courtyard       Behavioral response: N/A   Intervention Topic: Social-Skills   Discussion/Intervention: Patient refused to attend group.   Clinical Observations/Feedback:  Patient refused to attend group.    Dominiqua Cooner LRT/CTRS        Joud Ingwersen 02/06/2021 3:15 PM

## 2021-02-06 NOTE — BHH Suicide Risk Assessment (Signed)
[facility] INPATIENT:  Family/Significant Other Suicide Prevention Education  Suicide Prevention Education:  Education Completed; Marquette Saa, daughter-in-law  (name of family member/significant other) has been identified by the patient as the family member/significant other with whom the patient will be residing, and identified as the person(s) who will aid the patient in the event of a mental health crisis (suicidal ideations/suicide attempt).  With written consent from the patient, the family member/significant other has been provided the following suicide prevention education, prior to the and/or following the discharge of the patient.  The suicide prevention education provided includes the following: Suicide risk factors Suicide prevention and interventions National Suicide Hotline telephone number [facility] assessment telephone number [facility] Emergency Assistance [place] and/or Residential Mobile Crisis Unit telephone number  Request made of family/significant other to: Remove weapons (e.g., guns, rifles, knives), all items previously/currently identified as safety concern.   Remove drugs/medications (over-the-counter, prescriptions, illicit drugs), all items previously/currently identified as a safety concern.  The family member/significant other verbalizes understanding of the suicide prevention education information provided.  The family member/significant other agrees to remove the items of safety concern listed above.  Mylene Bow A Martinique 02/06/2021, 4:19 PM
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
## 2021-02-07 NOTE — Progress Notes (Signed)
Patient is A & O x4.  She is pleasant and cooperative.  She was demanding at times.  She denies SI, HI, or AVH.  She was cooperative with medication.  She had no complaints.  Encouragement and support was provided to patient.  Safety checks maintained.  Pt remains safe on unit with 15 q min checks.

## 2021-02-07 NOTE — Progress Notes (Addendum)
[facility] MD Progress Note  02/07/2021 12:52 PM Joan Baldwin  MRN:  [id number] Subjective:   The patient is being seen for the first time today.  She reported depression to be a 6 out of 10 and anxiety be a 5 out of 10 this morning with the nurse.  She denies all symptoms today to me.  Also denies any stressors besides quarreling with her son.  Says that she usually gets along with him but he becomes quite irritable when drinking alcohol which he usually does every evening after work.  Says that she has nothing to be depressed or depressed about.  "I have a beautiful boxer dog."  No hallucinations she slept well last night.  Feels a little tired today.  Asks me when she can be discharged.  Discussed that this would be deferred to the primary team.   Principal Problem: <principal problem not specified> Diagnosis: Active Problems:   Major depression, recurrent, chronic (HCC)  Total Time spent with patient: 20 minutes   Past Medical History:  Past Medical History:  Diagnosis Date   Anxiety    can cause palpitations   Balance problems    had brain scan (10/02/19) / clearance given by Dr Caryn Section   Cataract    Complication of anesthesia    patient state, "they have to give me alot to go out" during colonoscopy   Depression    GERD (gastroesophageal reflux disease)    Glaucoma    Hepatitis    B w treatment/ been cleared (Dr Maralyn Sago records)   History of chicken pox    Hypertension    controlled on meds   Osteoporosis    Sepsis ([place]) 01/17/2016   Wears dentures    upper    Past Surgical History:  Procedure Laterality Date   ABDOMINAL HYSTERECTOMY     vaginal. Still has both ovaries   back surgeries     x 2   CATARACT EXTRACTION W/PHACO Left 10/09/2019   Procedure: CATARACT EXTRACTION PHACO AND INTRAOCULAR LENS PLACEMENT ([place]) LEFT;  Surgeon: Birder Robson, MD;  Location: [place];  Service: Ophthalmology;  Laterality: Left;  6.16 0:46.0   CATARACT EXTRACTION W/PHACO  Right 10/30/2019   Procedure: CATARACT EXTRACTION PHACO AND INTRAOCULAR LENS PLACEMENT ([facility]) RIGHT 6.31 00:46.1;  Surgeon: Birder Robson, MD;  Location: [place];  Service: Ophthalmology;  Laterality: Right;   COLONOSCOPY  2007   done in [place] per patient. Normal except for moderate hemorrhoids   MOUTH SURGERY  2011   STOMACH SURGERY     stapled for obesity/later reversed   Tailbone surgery     removal of coccyx   Family History:  Family History  Problem Relation Age of Onset   Emphysema Mother    Kidney failure Sister    Heart Problems Brother    Diabetes Brother    Skin cancer Brother    Breast cancer Neg Hx    Social History:  Social History   Substance and Sexual Activity  Alcohol Use Yes   Alcohol/week: 0.0 standard drinks     Social History   Substance and Sexual Activity  Drug Use No    Social History   Socioeconomic History   Marital status: Widowed    Spouse name: Not on file   Number of children: 3   Years of education: Not on file   Highest education level: 12th grade  Occupational History   Occupation: Retired  Tobacco Use   Smoking status: Former  Packs/day: 1.00    Years: 30.00    Pack years: 30.00    Types: Cigarettes    Quit date: 01/11/2001    Years since quitting: 20.0   Smokeless tobacco: Never  Vaping Use   Vaping Use: Never used  Substance and Sexual Activity   Alcohol use: Yes    Alcohol/week: 0.0 standard drinks   Drug use: No   Sexual activity: Not on file  Other Topics Concern   Not on file  Social History Narrative   Not on file   Social Determinants of Health   Financial Resource Strain: Not on file  Food Insecurity: Not on file  Transportation Needs: Not on file  Physical Activity: Not on file  Stress: Not on file  Social Connections: Not on file   Additional Social History:                         Sleep: Good  Appetite:  Good  Current Medications: Current Facility-Administered  Medications  Medication Dose Route Frequency Provider Last Rate Last Admin   acetaminophen (TYLENOL) tablet 650 mg  650 mg Oral Q6H PRN Sherlon Handing, NP       alum & mag hydroxide-simeth (MAALOX/MYLANTA) 200-200-20 MG/5ML suspension 30 mL  30 mL Oral Q4H PRN Waldon Merl F, NP       amLODipine (NORVASC) tablet 10 mg  10 mg Oral Daily Waldon Merl F, NP   10 mg at 02/07/21 1012   aspirin chewable tablet 81 mg  81 mg Oral Daily Waldon Merl F, NP   81 mg at 02/07/21 1012   diazepam (VALIUM) tablet 5 mg  5 mg Oral Q6H PRN Darnelle Bos, RPH   5 mg at 02/07/21 1011   FLUoxetine (PROZAC) capsule 20 mg  20 mg Oral Daily Parks Ranger, DO   20 mg at 02/07/21 1012   lisinopril (ZESTRIL) tablet 40 mg  40 mg Oral Daily Waldon Merl F, NP   40 mg at 02/07/21 1012   magnesium hydroxide (MILK OF MAGNESIA) suspension 30 mL  30 mL Oral Daily PRN Waldon Merl F, NP       pantoprazole (PROTONIX) EC tablet 40 mg  40 mg Oral Daily Waldon Merl F, NP   40 mg at 02/07/21 1013   QUEtiapine (SEROQUEL) tablet 50 mg  50 mg Oral QHS Parks Ranger, DO   50 mg at 02/06/21 2108   traZODone (DESYREL) tablet 100 mg  100 mg Oral QHS Waldon Merl F, NP   100 mg at 02/06/21 2109    Lab Results: No results found for this or any previous visit (from the past 48 hour(s)).  Blood Alcohol level:  Lab Results  Component Value Date   ETH <10 02/04/2021   ETH <10 25/42/7062    Metabolic Disorder Labs: Lab Results  Component Value Date   HGBA1C 6.0 (A) 04/11/2020   No results found for: PROLACTIN Lab Results  Component Value Date   CHOL 185 06/01/2019   TRIG 73 06/01/2019   HDL 44 06/01/2019   CHOLHDL 4.2 06/01/2019   LDLCALC 127 (H) 06/01/2019   LDLCALC 101 (H) 08/30/2018    Physical Findings: AIMS:  , ,  ,  ,    CIWA:    COWS:     Musculoskeletal: Strength & Muscle Tone: within normal limits Gait & Station: normal Patient leans: Left  Psychiatric  Specialty Exam:  Presentation  General Appearance: Appropriate for Environment  Eye Contact:Good  Speech:Clear and Coherent  Speech Volume:Decreased  Handedness:Right   Mood and Affect  Mood:Euthymic  Affect:Congruent   Thought Process  Thought Processes:Coherent  Descriptions of Associations:Intact  Orientation:Full (Time, Place and Person)  Thought Content:Logical  History of Schizophrenia/Schizoaffective disorder:No  Duration of Psychotic Symptoms:No data recorded Hallucinations:No data recorded Ideas of Reference:None  Suicidal Thoughts:No data recorded Homicidal Thoughts:No data recorded  Sensorium  Memory:Immediate Good; Recent Good  Judgment:Intact  Insight:Fair   Executive Functions  Concentration:Fair  Attention Span:Fair  [place]   Psychomotor Activity  Psychomotor Activity:No data recorded  Assets  Assets:Communication Skills; Desire for Improvement; Resilience; Social Support   Sleep  Sleep:No data recorded   Physical Exam: Physical Exam ROS Blood pressure (!) 121/55, pulse 70, temperature 98.3 F (36.8 C), temperature source Oral, resp. rate 18, height 5\' 7"  (1.702 m), weight 76 kg, SpO2 97 %. Body mass index is 26.23 kg/m.   Treatment Plan Summary: Daily contact with patient to assess and evaluate symptoms and progress in treatment Daily contact with patient to assess and evaluate symptoms and progress in treatment, Medication management, and Plan restart Prozac and add Seroquel at bedtime.  1/28 No changes   Rulon Sera, MD 02/07/2021, 12:52 PM

## 2021-02-07 NOTE — Plan of Care (Signed)
Patient presents A&O x4  with depressed and sad affect.  Pt reports sleeping well.  Rates depression 7/10 and anxiety 9/10.  Requests prn valium - given as ordered.  VSS.  Compliant with all meds.  Patient isolative to room except meals.  Will continue with Q 15 min safety checks per unit policy.  Problem: Education: Goal: Knowledge of [place] [facility] Education information/materials will improve Outcome: Progressing Goal: Mental status will improve Outcome: Progressing Goal: Verbalization of understanding the information provided will improve Outcome: Progressing   Problem: Activity: Goal: Interest or engagement in activities will improve Outcome: Not Progressing

## 2021-02-07 NOTE — Progress Notes (Signed)
Patient reports anxiety stated her valium was changed. Patient was educated on her medications. Compliant with medications. No adverse drug noted. Endorses anxiety 5/10 and depression 4/10.   Support and encouragement provided. Denies SI/HI/A/V/H and verbally contracted for safety. Patient remains safe using front wheel walker and all fall protocol in place. Patient remains safe.

## 2021-02-08 NOTE — Progress Notes (Addendum)
[facility] MD Progress Note  02/08/2021 11:12 AM Joan Baldwin  MRN:  [id number] Subjective:   [birth year] Patient is wished a happy birthday today.  Says that utilizing the walker makes her feel older.  States that she was able to speak to her daughter-in-law and son today.  She woke up very early this morning due to how cold it was in her room.  She smiles and jokes.  Denies depression anxiety, hallucinations or delusions.  States that she takes eyedrops for glaucoma at home does not know the exact name.  Denies thoughts of suicide.  She has been calm and appropriate on the unit.  1/28 The patient is being seen for the first time today.  She reported depression to be a 6 out of 10 and anxiety be a 5 out of 10 this morning with the nurse.  She denies all symptoms today to me.  Also denies any stressors besides quarreling with her son.  Says that she usually gets along with him but he becomes quite irritable when drinking alcohol which he usually does every evening after work.  Says that she has nothing to be depressed or depressed about.  "I have a beautiful boxer dog."  No hallucinations she slept well last night.  Feels a little tired today.  Asks me when she can be discharged.  Discussed that this would be deferred to the primary team.   Principal Problem: <principal problem not specified> Diagnosis: Active Problems:   Major depression, recurrent, chronic (HCC)  Total Time spent with patient: 25 minutes   Past Medical History:  Past Medical History:  Diagnosis Date   Anxiety    can cause palpitations   Balance problems    had brain scan (10/02/19) / clearance given by Dr Caryn Section   Cataract    Complication of anesthesia    patient state, "they have to give me alot to go out" during colonoscopy   Depression    GERD (gastroesophageal reflux disease)    Glaucoma    Hepatitis    B w treatment/ been cleared (Dr Maralyn Sago records)   History of chicken pox    Hypertension    controlled on meds    Osteoporosis    Sepsis ([place]) 01/17/2016   Wears dentures    upper    Past Surgical History:  Procedure Laterality Date   ABDOMINAL HYSTERECTOMY     vaginal. Still has both ovaries   back surgeries     x 2   CATARACT EXTRACTION W/PHACO Left 10/09/2019   Procedure: CATARACT EXTRACTION PHACO AND INTRAOCULAR LENS PLACEMENT ([place]) LEFT;  Surgeon: Birder Robson, MD;  Location: [place];  Service: Ophthalmology;  Laterality: Left;  6.16 0:46.0   CATARACT EXTRACTION W/PHACO Right 10/30/2019   Procedure: CATARACT EXTRACTION PHACO AND INTRAOCULAR LENS PLACEMENT ([facility]) RIGHT 6.31 00:46.1;  Surgeon: Birder Robson, MD;  Location: [place];  Service: Ophthalmology;  Laterality: Right;   COLONOSCOPY  2007   done in [place] per patient. Normal except for moderate hemorrhoids   MOUTH SURGERY  2011   STOMACH SURGERY     stapled for obesity/later reversed   Tailbone surgery     removal of coccyx   Family History:  Family History  Problem Relation Age of Onset   Emphysema Mother    Kidney failure Sister    Heart Problems Brother    Diabetes Brother    Skin cancer Brother    Breast cancer Neg Hx    Social History:  Social History   Substance and Sexual Activity  Alcohol Use Yes   Alcohol/week: 0.0 standard drinks     Social History   Substance and Sexual Activity  Drug Use No    Social History   Socioeconomic History   Marital status: Widowed    Spouse name: Not on file   Number of children: 3   Years of education: Not on file   Highest education level: 12th grade  Occupational History   Occupation: Retired  Tobacco Use   Smoking status: Former    Packs/day: 1.00    Years: 30.00    Pack years: 30.00    Types: Cigarettes    Quit date: 01/11/2001    Years since quitting: 20.0   Smokeless tobacco: Never  Vaping Use   Vaping Use: Never used  Substance and Sexual Activity   Alcohol use: Yes    Alcohol/week: 0.0 standard drinks   Drug use: No    Sexual activity: Not on file  Other Topics Concern   Not on file  Social History Narrative   Not on file   Social Determinants of Health   Financial Resource Strain: Not on file  Food Insecurity: Not on file  Transportation Needs: Not on file  Physical Activity: Not on file  Stress: Not on file  Social Connections: Not on file   Additional Social History:                         Sleep: Good  Appetite:  Good  Current Medications: Current Facility-Administered Medications  Medication Dose Route Frequency Provider Last Rate Last Admin   acetaminophen (TYLENOL) tablet 650 mg  650 mg Oral Q6H PRN Sherlon Handing, NP       alum & mag hydroxide-simeth (MAALOX/MYLANTA) 200-200-20 MG/5ML suspension 30 mL  30 mL Oral Q4H PRN Waldon Merl F, NP       amLODipine (NORVASC) tablet 10 mg  10 mg Oral Daily Waldon Merl F, NP   10 mg at 02/08/21 1019   aspirin chewable tablet 81 mg  81 mg Oral Daily Waldon Merl F, NP   81 mg at 02/08/21 1019   diazepam (VALIUM) tablet 5 mg  5 mg Oral Q6H PRN Darnelle Bos, RPH   5 mg at 02/08/21 0849   FLUoxetine (PROZAC) capsule 20 mg  20 mg Oral Daily Parks Ranger, DO   20 mg at 02/08/21 1020   lisinopril (ZESTRIL) tablet 40 mg  40 mg Oral Daily Waldon Merl F, NP   40 mg at 02/08/21 1020   magnesium hydroxide (MILK OF MAGNESIA) suspension 30 mL  30 mL Oral Daily PRN Waldon Merl F, NP       pantoprazole (PROTONIX) EC tablet 40 mg  40 mg Oral Daily Waldon Merl F, NP   40 mg at 02/08/21 1020   QUEtiapine (SEROQUEL) tablet 50 mg  50 mg Oral QHS Parks Ranger, DO   50 mg at 02/07/21 2115   traZODone (DESYREL) tablet 100 mg  100 mg Oral QHS Waldon Merl F, NP   100 mg at 02/07/21 2114    Lab Results: No results found for this or any previous visit (from the past 48 hour(s)).  Blood Alcohol level:  Lab Results  Component Value Date   [facility] <10 02/04/2021   ETH <10 16/10/9602    Metabolic  Disorder Labs: Lab Results  Component Value Date   HGBA1C 6.0 (A) 04/11/2020  No results found for: PROLACTIN Lab Results  Component Value Date   CHOL 185 06/01/2019   TRIG 73 06/01/2019   HDL 44 06/01/2019   CHOLHDL 4.2 06/01/2019   LDLCALC 127 (H) 06/01/2019   LDLCALC 101 (H) 08/30/2018    Physical Findings: AIMS:  , ,  ,  ,    CIWA:    COWS:     Musculoskeletal: Strength & Muscle Tone: within normal limits Gait & Station: normal Patient leans: Left  Psychiatric Specialty Exam:  Presentation  General Appearance: Appropriate for Environment  Eye Contact:Good  Speech:Clear and Coherent  Speech Volume:Decreased  Handedness:Right   Mood and Affect  Mood:\good Affect:full  Thought Process  Thought Processes:Coherent  Descriptions of Associations:Intact  Orientation:Full (Time, Place and Person)  Thought Content:Logical  History of Schizophrenia/Schizoaffective disorder:No  Duration of Psychotic Symptoms:No data recorded Hallucinations:No data recorded Ideas of Reference:None  Suicidal Thoughts:No data recorded Homicidal Thoughts:No data recorded  Sensorium  Memory:Immediate Good; Recent Good  Judgment:Intact  Insight:Fair   Executive Functions  Concentration:Fair  Attention Span:Fair  [place]   Psychomotor Activity  Psychomotor Activity:No data recorded  Assets  Assets:Communication Skills; Desire for Improvement; Resilience; Social Support   Sleep  Sleep:No data recorded   Physical Exam: Physical Exam ROS Blood pressure 107/74, pulse 69, temperature 98 F (36.7 C), temperature source Oral, resp. rate 18, height 5\' 7"  (1.702 m), weight 76 kg, SpO2 97 %. Body mass index is 26.23 kg/m.   Treatment Plan Summary: Daily contact with patient to assess and evaluate symptoms and progress in treatment Daily contact with patient to assess and evaluate symptoms and progress in  treatment, Medication management, and Plan restart Prozac and add Seroquel at bedtime.  1/28 No changes  1/29 No changes   Rulon Sera, MD 02/08/2021, 11:12 AM

## 2021-02-08 NOTE — Progress Notes (Signed)
Patient is isolative to her room this evening. Denies SI/HI/A/VH and verbally contracted for safety. Endorses anxiety 5/10 and depression 6/10. Prn valium 5 mg for anxiety given at 2139 and effective. No adverse drug noted. Patient using front wheel walker for ambulation and remains safe this shift.  Support and encouragement provided.

## 2021-02-08 NOTE — Plan of Care (Signed)
Patient remain alert and oriented x4 with flat affect. Reports anxiety and depression. Denies SI, HI, and AVH. Denies pain or discomfort at this time. Compliant with all due medications. Self isolative to room, come out only for meals. Remain safe on the unit with Q15 minutes safety check.  Problem: Education: Goal: Knowledge of [place] [facility] Education information/materials will improve Outcome: Progressing Goal: Mental status will improve Outcome: Progressing Goal: Verbalization of understanding the information provided will improve Outcome: Progressing   Problem: Activity: Goal: Interest or engagement in activities will improve Outcome: Progressing

## 2021-02-09 ENCOUNTER — Ambulatory Visit: Payer: Self-pay | Admitting: *Deleted

## 2021-02-09 NOTE — Group Note (Signed)
LCSW Group Therapy Note  Group Date: 02/09/2021 Start Time: 1400 End Time: 1430   Type of Therapy and Topic:  Group Therapy - How To Cope with Nervousness about Discharge   Participation Level:  Did Not Attend   Description of Group This process group involved identification of patients' feelings about discharge. Some of them are scheduled to be discharged soon, while others are new admissions, but each of them was asked to share thoughts and feelings surrounding discharge from the hospital. One common theme was that they are excited at the prospect of going home, while another was that many of them are apprehensive about sharing why they were hospitalized. Patients were given the opportunity to discuss these feelings with their peers in preparation for discharge.  Therapeutic Goals  Patient will identify their overall feelings about pending discharge. Patient will think about how they might proactively address issues that they believe will once again arise once they get home (i.e. with parents). Patients will participate in discussion about having hope for change.   Summary of Patient Progress:    X   Therapeutic Modalities Cognitive Behavioral Therapy   Ramona A [place], LCSWA 02/09/2021  3:11 PM

## 2021-02-09 NOTE — Progress Notes (Signed)
[facility] MD Progress Note  02/09/2021 11:31 AM Joan Baldwin  MRN:  [id number] Subjective:  Joan Baldwin states that she wants to go home.  She has not spoken to her son who is the one that IVC her.  She downplayed and denied any depression or auditory hallucinations when she was admitted.  I started her on Prozac and trazodone and Seroquel and she is taking them and she seems to like her current medication regimen.  I told her I am a little reluctant to discharge her without speaking to her son and daughter-in-law.  She does plan on living with them.  Principal Problem: <principal problem not specified> Diagnosis: Active Problems:   Major depression, recurrent, chronic (HCC)  Total Time spent with patient: 15 minutes  Past Psychiatric History: no  Past Medical History:  Past Medical History:  Diagnosis Date   Anxiety    can cause palpitations   Balance problems    had brain scan (10/02/19) / clearance given by Dr Caryn Section   Cataract    Complication of anesthesia    patient state, "they have to give me alot to go out" during colonoscopy   Depression    GERD (gastroesophageal reflux disease)    Glaucoma    Hepatitis    B w treatment/ been cleared (Dr Maralyn Sago records)   History of chicken pox    Hypertension    controlled on meds   Osteoporosis    Sepsis ([place]) 01/17/2016   Wears dentures    upper    Past Surgical History:  Procedure Laterality Date   ABDOMINAL HYSTERECTOMY     vaginal. Still has both ovaries   back surgeries     x 2   CATARACT EXTRACTION W/PHACO Left 10/09/2019   Procedure: CATARACT EXTRACTION PHACO AND INTRAOCULAR LENS PLACEMENT ([place]) LEFT;  Surgeon: Birder Robson, MD;  Location: [place];  Service: Ophthalmology;  Laterality: Left;  6.16 0:46.0   CATARACT EXTRACTION W/PHACO Right 10/30/2019   Procedure: CATARACT EXTRACTION PHACO AND INTRAOCULAR LENS PLACEMENT ([facility]) RIGHT 6.31 00:46.1;  Surgeon: Birder Robson, MD;  Location: [place];   Service: Ophthalmology;  Laterality: Right;   COLONOSCOPY  2007   done in [place] per patient. Normal except for moderate hemorrhoids   MOUTH SURGERY  2011   STOMACH SURGERY     stapled for obesity/later reversed   Tailbone surgery     removal of coccyx   Family History:  Family History  Problem Relation Age of Onset   Emphysema Mother    Kidney failure Sister    Heart Problems Brother    Diabetes Brother    Skin cancer Brother    Breast cancer Neg Hx     Social History:  Social History   Substance and Sexual Activity  Alcohol Use Yes   Alcohol/week: 0.0 standard drinks     Social History   Substance and Sexual Activity  Drug Use No    Social History   Socioeconomic History   Marital status: Widowed    Spouse name: Not on file   Number of children: 3   Years of education: Not on file   Highest education level: 12th grade  Occupational History   Occupation: Retired  Tobacco Use   Smoking status: Former    Packs/day: 1.00    Years: 30.00    Pack years: 30.00    Types: Cigarettes    Quit date: 01/11/2001    Years since quitting: 20.0   Smokeless tobacco: Never  Vaping Use   Vaping Use: Never used  Substance and Sexual Activity   Alcohol use: Yes    Alcohol/week: 0.0 standard drinks   Drug use: No   Sexual activity: Not on file  Other Topics Concern   Not on file  Social History Narrative   Not on file   Social Determinants of Health   Financial Resource Strain: Not on file  Food Insecurity: Not on file  Transportation Needs: Not on file  Physical Activity: Not on file  Stress: Not on file  Social Connections: Not on file   Additional Social History:                         Sleep: Good  Appetite:  Good  Current Medications: Current Facility-Administered Medications  Medication Dose Route Frequency Provider Last Rate Last Admin   acetaminophen (TYLENOL) tablet 650 mg  650 mg Oral Q6H PRN Sherlon Handing, NP       alum & mag  hydroxide-simeth (MAALOX/MYLANTA) 200-200-20 MG/5ML suspension 30 mL  30 mL Oral Q4H PRN Waldon Merl F, NP       amLODipine (NORVASC) tablet 10 mg  10 mg Oral Daily Waldon Merl F, NP   10 mg at 02/09/21 3790   aspirin chewable tablet 81 mg  81 mg Oral Daily Waldon Merl F, NP   81 mg at 02/09/21 0949   diazepam (VALIUM) tablet 5 mg  5 mg Oral Q6H PRN Darnelle Bos, RPH   5 mg at 02/09/21 0949   FLUoxetine (PROZAC) capsule 20 mg  20 mg Oral Daily Parks Ranger, DO   20 mg at 02/09/21 0949   lisinopril (ZESTRIL) tablet 40 mg  40 mg Oral Daily Waldon Merl F, NP   40 mg at 02/09/21 0948   magnesium hydroxide (MILK OF MAGNESIA) suspension 30 mL  30 mL Oral Daily PRN Waldon Merl F, NP   30 mL at 02/08/21 2111   pantoprazole (PROTONIX) EC tablet 40 mg  40 mg Oral Daily Waldon Merl F, NP   40 mg at 02/09/21 0949   QUEtiapine (SEROQUEL) tablet 50 mg  50 mg Oral QHS Parks Ranger, DO   50 mg at 02/08/21 2109   traZODone (DESYREL) tablet 100 mg  100 mg Oral QHS Waldon Merl F, NP   100 mg at 02/08/21 2109    Lab Results: No results found for this or any previous visit (from the past 48 hour(s)).  Blood Alcohol level:  Lab Results  Component Value Date   ETH <10 02/04/2021   ETH <10 24/09/7351    Metabolic Disorder Labs: Lab Results  Component Value Date   HGBA1C 6.0 (A) 04/11/2020   No results found for: PROLACTIN Lab Results  Component Value Date   CHOL 185 06/01/2019   TRIG 73 06/01/2019   HDL 44 06/01/2019   CHOLHDL 4.2 06/01/2019   LDLCALC 127 (H) 06/01/2019   LDLCALC 101 (H) 08/30/2018    Physical Findings: AIMS:  , ,  ,  ,    CIWA:    COWS:     Musculoskeletal: Strength & Muscle Tone: within normal limits Gait & Station: normal Patient leans: N/A  Psychiatric Specialty Exam:  Presentation  General Appearance: Appropriate for Environment  Eye Contact:Good  Speech:Clear and Coherent  Speech  Volume:Decreased  Handedness:Right   Mood and Affect  Mood:Euthymic  Affect:Congruent   Thought Process  Thought Processes:Coherent  Descriptions of Associations:Intact  Orientation:Full (  Time, Place and Person)  Thought Content:Logical  History of Schizophrenia/Schizoaffective disorder:No  Duration of Psychotic Symptoms:No data recorded Hallucinations:No data recorded Ideas of Reference:None  Suicidal Thoughts:No data recorded Homicidal Thoughts:No data recorded  Sensorium  Memory:Immediate Good; Recent Good  Judgment:Intact  Insight:Fair   Executive Functions  Concentration:Fair  Attention Span:Fair  [place]   Psychomotor Activity  Psychomotor Activity:No data recorded  Assets  Assets:Communication Skills; Desire for Improvement; Resilience; Social Support   Sleep  Sleep:No data recorded   Physical Exam: Physical Exam Vitals and nursing note reviewed.  Constitutional:      Appearance: Normal appearance. She is normal weight.  Neurological:     General: No focal deficit present.     Mental Status: She is alert and oriented to person, place, and time.  Psychiatric:        Attention and Perception: Attention and perception normal.        Speech: Speech normal.        Behavior: Behavior normal. Behavior is cooperative.        Thought Content: Thought content normal.        Cognition and Memory: Cognition and memory normal.        Judgment: Judgment is impulsive.   Review of Systems  Constitutional: Negative.   HENT: Negative.    Eyes: Negative.   Respiratory: Negative.    Cardiovascular: Negative.   Gastrointestinal: Negative.   Genitourinary: Negative.   Musculoskeletal: Negative.   Skin: Negative.   Neurological: Negative.   Endo/Heme/Allergies: Negative.   Psychiatric/Behavioral:  The patient is nervous/anxious.   Blood pressure (!) 120/58, pulse 80, temperature (!) 97.4 F (36.3 C),  temperature source Oral, resp. rate 20, height 5\' 7"  (1.702 m), weight 76 kg, SpO2 99 %. Body mass index is 26.23 kg/m.   Treatment Plan Summary: Daily contact with patient to assess and evaluate symptoms and progress in treatment, Medication management, and Plan social work to speak with family and schedule follow-up.  Continue current medications.  Parks Ranger, DO 02/09/2021, 11:31 AM

## 2021-02-09 NOTE — Care Management Important Message (Signed)
Important Message  Patient Details  Name: Joan Baldwin MRN: [id number] Date of Birth: November 06, 1945   Medicare Important Message Given:  Yes     Nicolis Boody A [place], LCSWA 02/09/2021, 4:24 PM

## 2021-02-09 NOTE — Plan of Care (Signed)
Patient is alert and oriented x4. Says that she slept well but woke up several times during the night. Patient reports depression a 2/10 and anxiety a 4/10. Patient denies SI/HI/AVH. Patient states that her appetite is fair. Ate breakfast in the day room with other patients and staff. Morning medications administered as ordered. Patient very isolative, stays mostly in her room. Patients denies pain or discomfort at this time. Q 15 minutes safety checks maintained. Will continue to monitor.   Problem: Education: Goal: Knowledge of [facility] Education information/materials will improve Outcome: Progressing Goal: Mental status will improve Outcome: Progressing Goal: Verbalization of understanding the information provided will improve Outcome: Progressing   Problem: Activity: Goal: Interest or engagement in activities will improve Outcome: Progressing

## 2021-02-09 NOTE — Telephone Encounter (Signed)
Please review. Caller Lyda Jester Zahniser is not listed on patient's DPR.

## 2021-02-09 NOTE — Telephone Encounter (Signed)
Cannot disclose any medical information if the caller is not on the DPR, but you can reassure her that I am aware of the patient's current medical condition and am following her treatment.

## 2021-02-09 NOTE — Progress Notes (Signed)
Recreation Therapy Notes    Date: 02/09/2021   Time: 1:15pm     Location: Courtyard        Behavioral response: N/A   Intervention Topic: Leisure    Discussion/Intervention: Patient refused to attend group.    Clinical Observations/Feedback:  Patient refused to attend group.    Warden Buffa LRT/CTRS        Littie Chiem 02/09/2021 3:37 PM

## 2021-02-09 NOTE — Telephone Encounter (Signed)
Summary: Patient Welfare   Caller is concerned about patient welfare and son is not giving her any information. Caller states she used to bring patient to the doctor office for 1 year. Caller would like to speak with nurse     Caller is concerned that patient's son is refusing calls for patient. She is concerned about patient care. Some family does not know location of patient- they believe patient is in [facility] facility- but not sure. Family wants to make sure PCP can help patient if needed. They are concerned about lack of communication with her son- family is not close and does not communicate. Advised will let provider know family concerns Reason for Disposition  [1] Caller requesting NON-URGENT health information AND [2] PCP's office is the best resource  Answer Assessment - Initial Assessment Questions 1. REASON FOR CALL or QUESTION: "What is your reason for calling today?" or "How can I best help you?" or "What question do you have that I can help answer?"     Family is calling with concerns about patient- they have not been able to talk to her and her son is not sharing information with them  Protocols used: Information Only Call - No Triage-A-AH

## 2021-02-09 NOTE — BHH Counselor (Addendum)
CSW spoke with pt's daughter in law, Marquette Saa, [phone], who stated that pt is able to return home tomorrow.   She stated that she would be available to pick up pt when she is ready to be discharged. She stated she could assist taking pt's to appointments if needed going forward.   CSW will discuss with pt options for follow up care and to inquire if pt is interested since she declined earlier.   Mady Oubre [place], MSW, LCSW-A 1/30/20233:33 PM

## 2021-02-10 DIAGNOSIS — F339 Major depressive disorder, recurrent, unspecified: Secondary | ICD-10-CM | POA: Diagnosis present

## 2021-02-10 LAB — LIPID PANEL
Cholesterol: 186 mg/dL (ref 0–200)
HDL: 35 mg/dL — ABNORMAL LOW (ref >40)
LDL Cholesterol: 129 mg/dL — ABNORMAL HIGH (ref 0–99)
Total CHOL/HDL Ratio: 5.3 RATIO
Triglycerides: 112 mg/dL (ref <150)
VLDL: 22 mg/dL (ref 0–40)

## 2021-02-10 LAB — HEMOGLOBIN A1C
Hgb A1c MFr Bld: 6.1 % — ABNORMAL HIGH (ref 4.8–5.6)
Mean Plasma Glucose: 128 mg/dL

## 2021-02-10 MED ORDER — QUETIAPINE FUMARATE 50 MG PO TABS: 50.0000 mg | ORAL_TABLET | Freq: Every day | ORAL | 2 refills | Status: DC

## 2021-02-10 MED ORDER — LISINOPRIL 40 MG PO TABS: 40.0000 mg | ORAL_TABLET | Freq: Every day | ORAL | 2 refills | Status: DC

## 2021-02-10 MED ORDER — FLUOXETINE HCL 20 MG PO CAPS: 20.0000 mg | ORAL_CAPSULE | Freq: Every day | ORAL | 2 refills | Status: DC

## 2021-02-10 MED ORDER — AMLODIPINE BESYLATE 10 MG PO TABS: 10.0000 mg | ORAL_TABLET | Freq: Every day | ORAL | 2 refills | Status: DC

## 2021-02-10 MED ORDER — DIAZEPAM 5 MG PO TABS: 5.0000 mg | ORAL_TABLET | Freq: Four times a day (QID) | ORAL | 2 refills | Status: DC | PRN

## 2021-02-10 MED ORDER — TRAZODONE HCL 100 MG PO TABS: 100.0000 mg | ORAL_TABLET | Freq: Every day | ORAL | 2 refills | Status: DC

## 2021-02-10 NOTE — BHH Counselor (Signed)
CSW discussed follow up care with pt in regards to discharge planning. Pt declined services for CSW to assist in making a follow up appointment with mental health services. CSW will provide pt with resources as needed.   Ciaran Begay [place], MSW, LCSW-A 1/31/20231:01 PM
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
## 2021-02-10 NOTE — Progress Notes (Signed)
°  [facility] Adult Case Management Discharge Plan :  Will you be returning to the same living situation after discharge:  Yes,  pt is returning home At discharge, do you have transportation home?: Yes,  pt's family member will provide transportation Do you have the ability to pay for your medications: Yes,  pt has [facility] Medicare  Release of information consent forms completed and in the chart;  Patient's signature needed at discharge.  Patient to Follow up at:  Follow-up Information     [facility] Follow up.   Specialty: Behavioral Health Why: Though you declined follow up services, here is a resource for therapy and medication management if you become interested in a referral for community mental health services. Thanks! Contact information: [facility] Follow up.   Why: (New Name: [place]) Though you declined follow up services, here is a resource for therapy and medication management if you become interested in a referral for community mental health services. Thanks! Contact information: [place] [address] [phone]                 Next level of care provider has access to [place] and Suicide Prevention discussed: Yes,  completed with pt's daughter-in-law     Has patient been referred to the Quitline?: Patient refused referral  Patient has been referred for addiction treatment: N/A  Miguel Christiana A [place], [place] 02/10/2021, 12:33 PM

## 2021-02-10 NOTE — BHH Suicide Risk Assessment (Signed)
[facility] Discharge Suicide Risk Assessment   Principal Problem: <principal problem not specified> Discharge Diagnoses: Active Problems:   Major depression, recurrent, chronic (HCC)   Total Time spent with patient: 1 hour  Musculoskeletal: Strength & Muscle Tone: within normal limits Gait & Station: normal Patient leans: N/A  Psychiatric Specialty Exam  Presentation  General Appearance: Appropriate for Environment  Eye Contact:Good  Speech:Clear and Coherent  Speech Volume:Decreased  Handedness:Right   Mood and Affect  Mood:Euthymic  Duration of Depression Symptoms: Greater than two weeks  Affect:Congruent   Thought Process  Thought Processes:Coherent  Descriptions of Associations:Intact  Orientation:Full (Time, Place and Person)  Thought Content:Logical  History of Schizophrenia/Schizoaffective disorder:No  Duration of Psychotic Symptoms:No data recorded Hallucinations:No data recorded Ideas of Reference:None  Suicidal Thoughts:No data recorded Homicidal Thoughts:No data recorded  Sensorium  Memory:Immediate Good; Recent Good  Judgment:Intact  Insight:Fair   Executive Functions  Concentration:Fair  Attention Span:Fair  [place]   Psychomotor Activity  Psychomotor Activity:No data recorded  Assets  Assets:Communication Skills; Desire for Improvement; Resilience; Social Support   Sleep  Sleep:No data recorded  Physical Exam: Physical Exam Vitals and nursing note reviewed.  Constitutional:      Appearance: Normal appearance. She is normal weight.  Neurological:     General: No focal deficit present.     Mental Status: She is alert and oriented to person, place, and time.  Psychiatric:        Attention and Perception: Attention and perception normal.        Mood and Affect: Mood normal.        Speech: Speech normal.        Behavior: Behavior normal. Behavior is cooperative.         Thought Content: Thought content normal.        Cognition and Memory: Cognition and memory normal.        Judgment: Judgment normal.   Review of Systems  Constitutional: Negative.   HENT: Negative.    Eyes: Negative.   Respiratory: Negative.    Cardiovascular: Negative.   Gastrointestinal: Negative.   Genitourinary: Negative.   Musculoskeletal: Negative.   Skin: Negative.   Neurological: Negative.   Endo/Heme/Allergies: Negative.   Psychiatric/Behavioral: Negative.    Blood pressure (!) 110/51, pulse 70, temperature (!) 97.3 F (36.3 C), temperature source Oral, resp. rate 18, height 5\' 7"  (1.702 m), weight 76 kg, SpO2 100 %. Body mass index is 26.23 kg/m.  Mental Status Per Nursing Assessment::   On Admission:  NA  Demographic Factors:  Caucasian  Loss Factors: NA  Historical Factors: NA  Risk Reduction Factors:   NA  Continued Clinical Symptoms:  Depression:   Anhedonia  Cognitive Features That Contribute To Risk:  Closed-mindedness    Suicide Risk:  Minimal: No identifiable suicidal ideation.  Patients presenting with no risk factors but with morbid ruminations; may be classified as minimal risk based on the severity of the depressive symptoms   Follow-up Information      [facility] Follow [facility].   Specialty: Behavioral Health Why: Though you declined follow up services, here is a resource for therapy and medication management if you become interested in a referral for community mental health services. Thanks! Contact information: [facility] Follow up.   Why: (New Name: [place]) Though you declined follow  up services, here is a resource for therapy and medication management if you become interested in a referral for community mental health services. Thanks! Contact  information: [place] [address] [phone]                 Plan Of Care/Follow-up recommendations:  [facility]   Parks Ranger, DO 02/10/2021, 11:52 AM

## 2021-02-10 NOTE — Progress Notes (Signed)
Patient c/o anxiety 5/10 denies SI/HI/A/VH and verbally contracted for safety. Stated she is looking forward to go home.  Scheduled medications administered per Provider order and PRN valium 5 mg PO  for anxiety given at 2110 and effective by 2200.  Support and encouragement provided. Routine safety checks conducted every 15 minutes. Patient ambulating with front wheel walker all fall protocol in place and Patient remains safe. Patient notified to inform staff with problems or concerns.   No adverse drug reactions noted. Patient contracts for safety at this time. Will continue to monitor.

## 2021-02-10 NOTE — Progress Notes (Signed)
Patient denies SI, HI, and AVH. Patient endorses anxiety related to being in the hospital. Patient states that she is just cold and wants to go home. Patient is compliant with scheduled medications. Support and encouragement given. Patient remains isolative to her room, coming out only for breakfast. Patient remains safe on the unit at this time.

## 2021-02-10 NOTE — Progress Notes (Signed)
Pt was educated on prescriptions and follow up care. Pt questions were answered and pt verbalized understanding and did not voice any concerns. Pt's belongings were returned. Pt was safely discharged to the [facility].

## 2021-02-10 NOTE — Plan of Care (Signed)
Problem: Group Participation Goal: STG - Patient will engage in groups without prompting or encouragement from LRT x3 group sessions within 5 recreation therapy group sessions Description: STG - Patient will engage in groups without prompting or encouragement from LRT x3 group sessions within 5 recreation therapy group sessions 02/10/2021 1221 by Ernest Haber, LRT Outcome: Not Applicable 2/71/2929 0903 by Ernest Haber, LRT Outcome: Not Met (add Reason) Note: Patient did not attend any groups.

## 2021-02-10 NOTE — Progress Notes (Signed)
Recreation Therapy Notes  INPATIENT RECREATION TR PLAN  Patient Details Name: Joan Baldwin MRN: [id number] DOB: [birth year] Today's Date: 02/10/2021  Rec Therapy Plan Is patient appropriate for Therapeutic Recreation?: Yes Treatment times per week: at least 3 Estimated Length of Stay: 5-7 days TR Treatment/Interventions: Group participation (Comment)  Discharge Criteria Pt will be discharged from therapy if:: Discharged Treatment plan/goals/alternatives discussed and agreed upon by:: Patient/family  Discharge Summary Short term goals set: Patient will engage in groups without prompting or encouragement from LRT x3 group sessions within 5 recreation therapy group sessions Short term goals met: Not met Reason goals not met: Patient did not attend any groups Therapeutic equipment acquired: N/A Reason patient discharged from therapy: Discharge from hospital Pt/family agrees with progress & goals achieved: Yes Date patient discharged from therapy: 02/10/21   Kahner Yanik 02/10/2021, 12:22 PM

## 2021-02-10 NOTE — Progress Notes (Signed)
Recreation Therapy Notes  Date: 02/10/2021  Time: 10:50 am    Location: Craft room    Behavioral response: N/A   Intervention Topic: Coping skills   Discussion/Intervention: Patient refused to attend group.   Clinical Observations/Feedback:  Patient refused to attend group.   Chales Pelissier LRT/CTRS         Fintan Grater 02/10/2021 12:04 PM

## 2021-02-11 NOTE — Telephone Encounter (Signed)
I called Joan Baldwin and advised her of Dr. Maralyn Sago message.

## 2021-02-23 ENCOUNTER — Ambulatory Visit: Payer: Medicare Other | Admitting: Family Medicine

## 2021-03-02 ENCOUNTER — Telehealth: Payer: Self-pay

## 2021-03-02 NOTE — Telephone Encounter (Signed)
Copied from [place] [id number]. Topic: General - Other >> Mar 02, 2021  8:40 AM Tessa Lerner A wrote: Reason for CRM: The patient's daughter has called requesting a list of the patient's medications  The patient's daughter would like to come by the practice and pick I tup this afternoon today 03/02/21  Please contact further if needed

## 2021-03-02 NOTE — Telephone Encounter (Signed)
Please review and advise patients daughter in law whether she needs to sign ROI to get requested records.

## 2021-03-06 ENCOUNTER — Ambulatory Visit (INDEPENDENT_AMBULATORY_CARE_PROVIDER_SITE_OTHER): Payer: Medicare Other | Admitting: Family Medicine

## 2021-03-06 ENCOUNTER — Encounter: Payer: Self-pay | Admitting: Family Medicine

## 2021-03-06 ENCOUNTER — Other Ambulatory Visit: Payer: Self-pay

## 2021-03-06 VITALS — BP 96/60 | HR 92 | Temp 98.4°F | Resp 18 | Ht 66.0 in | Wt 141.0 lb

## 2021-03-06 DIAGNOSIS — F419 Anxiety disorder, unspecified: Secondary | ICD-10-CM

## 2021-03-06 DIAGNOSIS — G4700 Insomnia, unspecified: Secondary | ICD-10-CM

## 2021-03-06 DIAGNOSIS — F339 Major depressive disorder, recurrent, unspecified: Secondary | ICD-10-CM

## 2021-03-06 DIAGNOSIS — Z13820 Encounter for screening for osteoporosis: Secondary | ICD-10-CM

## 2021-03-06 DIAGNOSIS — Z23 Encounter for immunization: Secondary | ICD-10-CM

## 2021-03-06 MED ORDER — QUETIAPINE FUMARATE 50 MG PO TABS: 50.0000 mg | ORAL_TABLET | Freq: Every day | ORAL | 2 refills | Status: DC

## 2021-03-06 MED ORDER — DIAZEPAM 10 MG PO TABS: 5.0000 mg | ORAL_TABLET | Freq: Four times a day (QID) | ORAL | 0 refills | Status: DC | PRN
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
## 2021-04-07 ENCOUNTER — Ambulatory Visit (INDEPENDENT_AMBULATORY_CARE_PROVIDER_SITE_OTHER): Payer: Medicare Other | Admitting: Family Medicine

## 2021-04-07 ENCOUNTER — Other Ambulatory Visit: Payer: Self-pay

## 2021-04-07 ENCOUNTER — Encounter: Payer: Self-pay | Admitting: Family Medicine

## 2021-04-07 VITALS — BP 108/49 | HR 82 | Temp 98.3°F | Wt 143.6 lb

## 2021-04-07 DIAGNOSIS — F419 Anxiety disorder, unspecified: Secondary | ICD-10-CM | POA: Diagnosis not present

## 2021-04-07 DIAGNOSIS — I10 Essential (primary) hypertension: Secondary | ICD-10-CM | POA: Diagnosis not present

## 2021-04-07 DIAGNOSIS — M810 Age-related osteoporosis without current pathological fracture: Secondary | ICD-10-CM | POA: Diagnosis not present

## 2021-04-07 MED ORDER — ALENDRONATE SODIUM 70 MG PO TABS: ORAL_TABLET | ORAL | 4 refills | Status: DC

## 2021-04-07 MED ORDER — DIAZEPAM 10 MG PO TABS: 5.0000 mg | ORAL_TABLET | Freq: Four times a day (QID) | ORAL | 2 refills | Status: DC | PRN

## 2021-04-07 MED ORDER — LISINOPRIL 20 MG PO TABS: 20.0000 mg | ORAL_TABLET | Freq: Every day | ORAL | 3 refills | Status: DC

## 2021-04-07 NOTE — Progress Notes (Signed)
?  ? ? ?I,Jana Robinson,acting as a scribe for Lelon Huh, MD.,have documented all relevant documentation on the behalf of Lelon Huh, MD,as directed by  Lelon Huh, MD while in the presence of Lelon Huh, MD. ? ?Established patient visit ? ? ?Patient: Joan Baldwin   DOB: 05-May-1945   76 y.o. Female  MRN: [id number] ?Visit Date: 04/07/2021 ? ?Today's healthcare provider: Lelon Huh, MD  ? ?Chief Complaint  ?Patient presents with  ? Depression  ? Anxiety  ? ?Subjective  ?  ?Depression ?       ?Pt presents for follow up anxiety and depression.  Son is here with patient and states patient will call him "scared." Son is stating she needs diazepam '10mg'$  and that '5mg'$  is not enough for her anxiety.  She was prescribed '10mg'$  at her visit one month ago. However when she called pharmacy for refill she was dispensed '5mg'$  from prescription written from he hospital discharge in January.  ? ?Medications: ?Outpatient Medications Prior to Visit  ?Medication Sig  ? alendronate (FOSAMAX) 70 MG tablet TAKE 1 TABLET BY MOUTH EVERY 7 DAYS. TAKE WITH A FULL GLASS OF WATER ON EMPTY STOMACH  ? amLODipine (NORVASC) 10 MG tablet Take 1 tablet (10 mg total) by mouth daily.  ? Calcium Carbonate-Vitamin D (CALCIUM 600/VITAMIN D PO) Take 1 tablet by mouth daily.   ? diazepam (VALIUM) 10 MG tablet Take 0.5-1 tablets (5-10 mg total) by mouth 4 (four) times daily as needed for anxiety.  ? diazepam (VALIUM) 5 MG tablet Take 1 tablet (5 mg total) by mouth every 6 (six) hours as needed for anxiety.  ? Fiber POWD Take 2 scoop by mouth daily.  ? FLUoxetine (PROZAC) 20 MG capsule Take 1 capsule (20 mg total) by mouth daily.  ? FLUoxetine (PROZAC) 20 MG capsule Take 1 capsule by mouth daily.  ? HYDROcodone-acetaminophen (NORCO/VICODIN) 5-325 MG tablet Take 1 tablet by mouth every 6 (six) hours as needed.  ? latanoprost (XALATAN) 0.005 % ophthalmic solution Place 1 drop into both eyes at bedtime.  ? lidocaine (LIDODERM) 5 % Place 1 patch  onto the skin every 12 (twelve) hours. Remove & Discard patch within 12 hours or as directed by MD  ? lidocaine (XYLOCAINE) 2 % solution USE AS DIRECTED 15ML IN MOUTH OR THROAT EVERY 4-6 HOURS AS NEEDED FOR MOUTH PAIN  ? lisinopril (ZESTRIL) 40 MG tablet Take 1 tablet (40 mg total) by mouth daily.  ? Multiple Vitamins-Minerals (CENTRUM SILVER) tablet   ? Multiple Vitamins-Minerals (MULTIVITAMIN ADULT PO) Take 1 tablet by mouth daily.  ? Omega-3 Fatty Acids (FISH OIL) 1000 MG CAPS Take 1 capsule by mouth daily.  ? omeprazole (PRILOSEC) 20 MG capsule Take 1 capsule (20 mg total) by mouth daily.  ? QUEtiapine (SEROQUEL) 50 MG tablet Take 1 tablet (50 mg total) by mouth at bedtime.  ? timolol (TIMOPTIC) 0.5 % ophthalmic solution timolol maleate 0.5 % eye drops ? INSTILL 1 DROP ONCE DAILY INTO EACH EYE  ? traZODone (DESYREL) 100 MG tablet Take 1 tablet (100 mg total) by mouth at bedtime.  ? ?No facility-administered medications prior to visit.  ? ? ? ? ? ?  Objective  ?  ?BP (!) 108/49 (BP Location: Right Arm, Patient Position: Sitting, Cuff Size: Normal)   Pulse 82   Temp 98.3 ?F (36.8 ?C) (Oral)   Wt 143 lb 9.6 oz (65.1 kg)   SpO2 100%   BMI 23.18 kg/m?  ? ? ?Physical Exam  ? ?  General appearance: Well developed, well nourished female, cooperative and in no acute distress ?Head: Normocephalic, without obvious abnormality, atraumatic ?Respiratory: Respirations even and unlabored, normal respiratory rate ?Extremities: All extremities are intact.  ?Skin: Skin color, texture, turgor normal. No rashes seen  ?Psych: Appropriate mood and affect. ?Neurologic: Mental status: Alert, oriented to person, place, and time, thought content appropriate.  ? ? Assessment & Plan  ?  ? ?1. Anxiety ?She has taking '10mg'$  diazepam for many year and nearly incapacitated with lower doses. Counseled that this class of medications becomes more likely to cause adverse effects with age and to limit use as much as possible.  ? ?- diazepam  (VALIUM) 10 MG tablet; Take 0.5-1 tablets (5-10 mg total) by mouth 4 (four) times daily as needed for anxiety. PLEASE CANCEL ALL REMAINING REFILLS OF THE '5MG'$  TABLETS.  Dispense: 100 tablet; Refill: 2 ? ?2. Osteoporosis, unspecified osteoporosis type, unspecified pathological fracture presence ?refill alendronate (FOSAMAX) 70 MG tablet; TAKE 1 TABLET BY MOUTH EVERY 7 DAYS. TAKE WITH A FULL GLASS OF WATER ON EMPTY STOMACH  Dispense: 12 tablet; Refill: 4 ? ?3. Essential (primary) hypertension ?BP has been running a little low, will reduce lisinopril (ZESTRIL) to 20 MG tablet; Take 1 tablet (20 mg total) by mouth daily.  Dispense: 90 tablet; Refill: 3  ? ?Future Appointments  ?Date Time Provider [place]  ?04/30/2021  1:00 PM [place] [facility] [place] [facility]  ?07/17/2021  2:20 PM Karmyn Lowman, Kirstie Peri, MD [facility]  ?  ?   ? ?The entirety of the information documented in the History of Present Illness, Review of Systems and Physical Exam were personally obtained by me. Portions of this information were initially documented by the CMA and reviewed by me for thoroughness and accuracy.   ? ? ?Lelon Huh, MD  ?[facility] ?[phone] (phone) ?[phone] (fax) ? ?[facility]  ?

## 2021-04-30 ENCOUNTER — Ambulatory Visit
Admission: RE | Admit: 2021-04-30 | Discharge: 2021-04-30 | Disposition: A | Discharge status: Home / Self Care | Payer: Medicare Other | Source: Ambulatory Visit | Attending: Family Medicine | Admitting: Family Medicine

## 2021-04-30 DIAGNOSIS — Z87891 Personal history of nicotine dependence: Secondary | ICD-10-CM | POA: Insufficient documentation

## 2021-04-30 DIAGNOSIS — M85851 Other specified disorders of bone density and structure, right thigh: Secondary | ICD-10-CM | POA: Diagnosis not present

## 2021-04-30 DIAGNOSIS — Z780 Asymptomatic menopausal state: Secondary | ICD-10-CM | POA: Insufficient documentation

## 2021-04-30 DIAGNOSIS — M810 Age-related osteoporosis without current pathological fracture: Secondary | ICD-10-CM | POA: Insufficient documentation

## 2021-04-30 DIAGNOSIS — Z13820 Encounter for screening for osteoporosis: Secondary | ICD-10-CM

## 2021-05-01 ENCOUNTER — Other Ambulatory Visit: Payer: Self-pay | Admitting: Family Medicine

## 2021-05-13 ENCOUNTER — Other Ambulatory Visit: Payer: Self-pay | Admitting: Family Medicine

## 2021-05-13 DIAGNOSIS — G4700 Insomnia, unspecified: Secondary | ICD-10-CM

## 2021-05-13 NOTE — Telephone Encounter (Signed)
Requested Prescriptions  ?Pending Prescriptions Disp Refills  ?? traZODone (DESYREL) 100 MG tablet [Pharmacy Med Name: traZODone HCl 100 MG Oral Tablet] 30 tablet 0  ?  Sig: TAKE 1 TABLET BY MOUTH AT BEDTIME  ?  ? Psychiatry: Antidepressants - Serotonin Modulator Passed - 05/13/2021 11:39 AM  ?  ?  Passed - Completed PHQ-2 or PHQ-9 in the last 360 days  ?  ?  Passed - Valid encounter within last 6 months  ?  Recent Outpatient Visits   ?      ? 1 month ago Anxiety  ? [facility] Caryn Section, Kirstie Peri, MD  ? 2 months ago Anxiety  ? [facility] Caryn Section, Kirstie Peri, MD  ? 5 months ago Anxiety  ? [facility] Caryn Section, Kirstie Peri, MD  ? 8 months ago Anxiety  ? [facility] Caryn Section, Kirstie Peri, MD  ? 1 year ago Anxiety  ? [facility] Caryn Section, Kirstie Peri, MD  ?  ?  ?Future Appointments   ?        ? In 2 months Fisher, Kirstie Peri, MD [facility]  ?  ? ?  ?  ?  ? ? ?

## 2021-05-15 ENCOUNTER — Ambulatory Visit: Payer: Medicare Other | Admitting: Family Medicine

## 2021-06-11 ENCOUNTER — Emergency Department (HOSPITAL_COMMUNITY): Payer: Medicare Other

## 2021-06-11 ENCOUNTER — Encounter (HOSPITAL_COMMUNITY): Payer: Self-pay | Admitting: Emergency Medicine

## 2021-06-11 ENCOUNTER — Other Ambulatory Visit: Payer: Self-pay

## 2021-06-11 ENCOUNTER — Emergency Department (HOSPITAL_COMMUNITY)
Admission: EM | Admit: 2021-06-11 | Discharge: 2021-06-11 | Disposition: A | Discharge status: Home / Self Care | Payer: Medicare Other | Attending: Emergency Medicine | Admitting: Emergency Medicine

## 2021-06-11 DIAGNOSIS — R410 Disorientation, unspecified: Secondary | ICD-10-CM | POA: Diagnosis not present

## 2021-06-11 DIAGNOSIS — R413 Other amnesia: Secondary | ICD-10-CM | POA: Insufficient documentation

## 2021-06-11 DIAGNOSIS — R4182 Altered mental status, unspecified: Secondary | ICD-10-CM | POA: Diagnosis not present

## 2021-06-11 DIAGNOSIS — I10 Essential (primary) hypertension: Secondary | ICD-10-CM | POA: Diagnosis not present

## 2021-06-11 LAB — URINALYSIS, ROUTINE W REFLEX MICROSCOPIC
Bacteria, UA: NONE SEEN
Bilirubin Urine: NEGATIVE
Glucose, UA: NEGATIVE mg/dL
Hgb urine dipstick: NEGATIVE
Ketones, ur: NEGATIVE mg/dL
Nitrite: NEGATIVE
Protein, ur: NEGATIVE mg/dL
Specific Gravity, Urine: 1.006 (ref 1.005–1.030)
Trans Epithel, UA: 1
pH: 7 (ref 5.0–8.0)

## 2021-06-11 LAB — COMPREHENSIVE METABOLIC PANEL
ALT: 7 U/L (ref 0–44)
AST: 20 U/L (ref 15–41)
Albumin: 4 g/dL (ref 3.5–5.0)
Alkaline Phosphatase: 38 U/L (ref 38–126)
Anion gap: 7 (ref 5–15)
BUN: 7 mg/dL — ABNORMAL LOW (ref 8–23)
CO2: 28 mmol/L (ref 22–32)
Calcium: 8.9 mg/dL (ref 8.9–10.3)
Chloride: 93 mmol/L — ABNORMAL LOW (ref 98–111)
Creatinine, Ser: 0.85 mg/dL (ref 0.44–1.00)
GFR, Estimated: >60 mL/min (ref >60)
Glucose, Bld: 122 mg/dL — ABNORMAL HIGH (ref 70–99)
Potassium: 3.9 mmol/L (ref 3.5–5.1)
Sodium: 128 mmol/L — ABNORMAL LOW (ref 135–145)
Total Bilirubin: 0.6 mg/dL (ref 0.3–1.2)
Total Protein: 6.8 g/dL (ref 6.5–8.1)

## 2021-06-11 LAB — CBC
HCT: 38 % (ref 36.0–46.0)
Hemoglobin: 13 g/dL (ref 12.0–15.0)
MCH: 31.6 pg (ref 26.0–34.0)
MCHC: 34.2 g/dL (ref 30.0–36.0)
MCV: 92.2 fL (ref 80.0–100.0)
Platelets: 344 10*3/uL (ref 150–400)
RBC: 4.12 MIL/uL (ref 3.87–5.11)
RDW: 12.3 % (ref 11.5–15.5)
WBC: 7.8 10*3/uL (ref 4.0–10.5)
nRBC: 0 % (ref 0.0–0.2)

## 2021-06-11 NOTE — ED Triage Notes (Signed)
Pt here with son. Son states she has been forgetful over the last week but worse the past 4 days with asking a question then will ask again right after. Pt alert, mildly groggy. Nad. Aware of person/place and time. Mild confusion noted and slow to answer questions at times. Denies gu sx.

## 2021-06-11 NOTE — ED Provider Notes (Signed)
[facility] EMERGENCY DEPARTMENT Provider Note   CSN: [id number] Arrival date & time: 06/11/21  2031     History {Add pertinent medical, surgical, social history, OB history to HPI:1} Chief Complaint  Patient presents with   Altered Mental Status    Joan Baldwin is a 76 y.o. female.  HPI     Home Medications Prior to Admission medications   Medication Sig Start Date End Date Taking? Authorizing Provider  amLODipine (NORVASC) 10 MG tablet Take 1 tablet (10 mg total) by mouth daily. 02/10/21   Parks Ranger, DO  Calcium Carbonate-Vitamin D (CALCIUM 600/VITAMIN D PO) Take 1 tablet by mouth daily.     [provider]  diazepam (VALIUM) 10 MG tablet Take 0.5-1 tablets (5-10 mg total) by mouth 4 (four) times daily as needed for anxiety. PLEASE CANCEL ALL REMAINING REFILLS OF THE '5MG'$  TABLETS. 04/07/21   Birdie Sons, MD  Fiber POWD Take 2 scoop by mouth daily.    [provider]  FLUoxetine (PROZAC) 20 MG capsule Take 1 capsule (20 mg total) by mouth daily. 02/11/21   Parks Ranger, DO  HYDROcodone-acetaminophen (NORCO/VICODIN) 5-325 MG tablet Take 1 tablet by mouth every 6 (six) hours as needed. 11/17/20   [provider]  latanoprost (XALATAN) 0.005 % ophthalmic solution Place 1 drop into both eyes at bedtime.    [provider]  lidocaine (LIDODERM) 5 % Place 1 patch onto the skin every 12 (twelve) hours. Remove & Discard patch within 12 hours or as directed by MD 01/20/21 01/20/22  Letitia Neri L, PA-C  lidocaine (XYLOCAINE) 2 % solution USE AS DIRECTED 15ML IN MOUTH OR THROAT EVERY 4-6 HOURS AS NEEDED FOR MOUTH PAIN 12/09/20   Birdie Sons, MD  lisinopril (ZESTRIL) 20 MG tablet Take 1 tablet (20 mg total) by mouth daily. 04/07/21   Birdie Sons, MD  Multiple Vitamins-Minerals (CENTRUM SILVER) tablet  12/08/20   [provider]  Multiple Vitamins-Minerals (MULTIVITAMIN ADULT PO) Take 1 tablet by mouth daily.     [provider]  Omega-3 Fatty Acids (FISH OIL) 1000 MG CAPS Take 1 capsule by mouth daily. 08/04/09   [provider]  omeprazole (PRILOSEC) 20 MG capsule Take 1 capsule (20 mg total) by mouth daily. 12/09/20   Birdie Sons, MD  QUEtiapine (SEROQUEL) 50 MG tablet Take 1 tablet (50 mg total) by mouth at bedtime. 03/06/21   Birdie Sons, MD  timolol (TIMOPTIC) 0.5 % ophthalmic solution timolol maleate 0.5 % eye drops  INSTILL 1 DROP ONCE DAILY INTO EACH EYE    [provider]  traZODone (DESYREL) 100 MG tablet TAKE 1 TABLET BY MOUTH AT BEDTIME 05/13/21   Birdie Sons, MD      Allergies    Gabapentin, Oxcarbazepine, Fluoxetine, Tramadol hcl, Ultram  [tramadol hcl], and Codeine    Review of Systems   Review of Systems  Physical Exam Updated Vital Signs BP (!) 89/67   Pulse 73   Temp 98.6 F (37 C) (Oral)   Resp (!) 21   SpO2 97%  Physical Exam  ED Results / Procedures / Treatments   Labs (all labs ordered are listed, but only abnormal results are displayed) Labs Reviewed  COMPREHENSIVE METABOLIC PANEL - Abnormal; Notable for the following components:      Result Value   Sodium 128 (*)    Chloride 93 (*)    Glucose, Bld 122 (*)    BUN 7 (*)  All other components within normal limits  CBC  URINALYSIS, ROUTINE W REFLEX MICROSCOPIC  CBG MONITORING, ED    EKG None  Radiology CT Head Wo Contrast  Result Date: 06/11/2021 CLINICAL DATA:  Mental status change, unknown cause EXAM: CT HEAD WITHOUT CONTRAST TECHNIQUE: Contiguous axial images were obtained from the base of the skull through the vertex without intravenous contrast. RADIATION DOSE REDUCTION: This exam was performed according to the departmental dose-optimization program which includes automated exposure control, adjustment of the mA and/or kV according to patient size and/or use of iterative reconstruction technique. COMPARISON:  10/02/2019 FINDINGS: Brain: No acute intracranial  abnormality. Specifically, no hemorrhage, hydrocephalus, mass lesion, acute infarction, or significant intracranial injury. Vascular: No hyperdense vessel or unexpected calcification. Skull: No acute calvarial abnormality. Sinuses/Orbits: No acute findings Other: None IMPRESSION: No acute intracranial abnormality. Electronically Signed   By: Rolm Baptise M.D.   On: 06/11/2021 21:29    Procedures Procedures  {Document cardiac monitor, telemetry assessment procedure when appropriate:1}  Medications Ordered in ED Medications - No data to display  ED Course/ Medical Decision Making/ A&P                           Medical Decision Making Amount and/or Complexity of Data Reviewed Labs: ordered. Radiology: ordered.   ***  {Document critical care time when appropriate:1} {Document review of labs and clinical decision tools ie heart score, Chads2Vasc2 etc:1}  {Document your independent review of radiology images, and any outside records:1} {Document your discussion with family members, caretakers, and with consultants:1} {Document social determinants of health affecting pt's care:1} {Document your decision making why or why not admission, treatments were needed:1} Final Clinical Impression(s) / ED Diagnoses Final diagnoses:  None    Rx / DC Orders ED Discharge Orders     None

## 2021-06-11 NOTE — Discharge Instructions (Signed)
The testing today does not show any problems to explain the memory loss and confusion.  Follow-up with your primary care doctor for further evaluation and treatment.  Consider seeing a neurologist for his memory loss, and a psychiatrist for possible problems with bipolar disorder or depression.  Make sure she is drinking plenty of fluids, and try to eat 3 meals a day.  Return here, if needed.

## 2021-06-11 NOTE — ED Notes (Signed)
Smile symmetrical, pupils perrla.

## 2021-06-16 ENCOUNTER — Ambulatory Visit (INDEPENDENT_AMBULATORY_CARE_PROVIDER_SITE_OTHER): Payer: Medicare Other

## 2021-06-16 VITALS — Wt 143.0 lb

## 2021-06-16 DIAGNOSIS — Z0000 Encounter for general adult medical examination without abnormal findings: Secondary | ICD-10-CM

## 2021-06-16 NOTE — Progress Notes (Signed)
Virtual Visit via Telephone Note  I connected with  Joan Baldwin on 06/16/21 at  1:30 PM EDT by telephone and verified that I am speaking with the correct person using two identifiers.  Location: Patient: home Provider: [facility] Persons participating in the virtual visit: Why   I discussed the limitations, risks, security and privacy concerns of performing an evaluation and management service by telephone and the availability of in person appointments. The patient expressed understanding and agreed to proceed.  Interactive audio and video telecommunications were attempted between this nurse and patient, however failed, due to patient having technical difficulties OR patient did not have access to video capability.  We continued and completed visit with audio only.  Some vital signs may be absent or patient reported.   Dionisio David, LPN  Subjective:   Joan Baldwin is a 76 y.o. female who presents for Medicare Annual (Subsequent) preventive examination.  Review of Systems           Objective:    There were no vitals filed for this visit. There is no height or weight on file to calculate BMI.     06/11/2021    8:42 PM 02/05/2021    3:30 PM 02/04/2021    8:59 PM 01/20/2021   12:55 PM 10/13/2020   11:58 AM 01/04/2020   10:53 PM 11/23/2019    7:37 AM  Advanced Directives  Does Patient Have a Medical Advance Directive? Yes  No No No No No  Type of Advance Directive Living will        Would patient like information on creating a medical advance directive? No - Patient declined   No - Patient declined No - Patient declined       Information is confidential and restricted. Go to Review Flowsheets to unlock data.    Current Medications (verified) Outpatient Encounter Medications as of 06/16/2021  Medication Sig   amLODipine (NORVASC) 10 MG tablet Take 1 tablet (10 mg total) by mouth daily.   Calcium Carbonate-Vitamin D (CALCIUM 600/VITAMIN D PO) Take  1 tablet by mouth daily.    diazepam (VALIUM) 10 MG tablet Take 0.5-1 tablets (5-10 mg total) by mouth 4 (four) times daily as needed for anxiety. PLEASE CANCEL ALL REMAINING REFILLS OF THE '5MG'$  TABLETS.   Fiber POWD Take 2 scoop by mouth daily.   FLUoxetine (PROZAC) 20 MG capsule Take 1 capsule (20 mg total) by mouth daily.   HYDROcodone-acetaminophen (NORCO/VICODIN) 5-325 MG tablet Take 1 tablet by mouth every 6 (six) hours as needed.   latanoprost (XALATAN) 0.005 % ophthalmic solution Place 1 drop into both eyes at bedtime.   lidocaine (LIDODERM) 5 % Place 1 patch onto the skin every 12 (twelve) hours. Remove & Discard patch within 12 hours or as directed by MD   lidocaine (XYLOCAINE) 2 % solution USE AS DIRECTED 15ML IN MOUTH OR THROAT EVERY 4-6 HOURS AS NEEDED FOR MOUTH PAIN   lisinopril (ZESTRIL) 20 MG tablet Take 1 tablet (20 mg total) by mouth daily.   Multiple Vitamins-Minerals (CENTRUM SILVER) tablet    Multiple Vitamins-Minerals (MULTIVITAMIN ADULT PO) Take 1 tablet by mouth daily.   Omega-3 Fatty Acids (FISH OIL) 1000 MG CAPS Take 1 capsule by mouth daily.   omeprazole (PRILOSEC) 20 MG capsule Take 1 capsule (20 mg total) by mouth daily.   QUEtiapine (SEROQUEL) 50 MG tablet Take 1 tablet (50 mg total) by mouth at bedtime.   timolol (TIMOPTIC) 0.5 % ophthalmic solution timolol  maleate 0.5 % eye drops  INSTILL 1 DROP ONCE DAILY INTO EACH EYE   traZODone (DESYREL) 100 MG tablet TAKE 1 TABLET BY MOUTH AT BEDTIME   No facility-administered encounter medications on file as of 06/16/2021.    Allergies (verified) Gabapentin, Oxcarbazepine, Fluoxetine, Tramadol hcl, Ultram  [tramadol hcl], and Codeine   History: Past Medical History:  Diagnosis Date   Anxiety    can cause palpitations   Balance problems    had brain scan (10/02/19) / clearance given by Dr Caryn Section   Cataract    Complication of anesthesia    patient state, "they have to give me alot to go out" during colonoscopy    Depression    GERD (gastroesophageal reflux disease)    Glaucoma    Hepatitis    B w treatment/ been cleared (Dr Maralyn Sago records)   History of chicken pox    Hypertension    controlled on meds   Osteoporosis    Sepsis ([place]) 01/17/2016   Wears dentures    upper   Past Surgical History:  Procedure Laterality Date   ABDOMINAL HYSTERECTOMY     vaginal. Still has both ovaries   back surgeries     x 2   CATARACT EXTRACTION W/PHACO Left 10/09/2019   Procedure: CATARACT EXTRACTION PHACO AND INTRAOCULAR LENS PLACEMENT ([place]) LEFT;  Surgeon: Birder Robson, MD;  Location: [facility];  Service: Ophthalmology;  Laterality: Left;  6.16 0:46.0   CATARACT EXTRACTION W/PHACO Right 10/30/2019   Procedure: CATARACT EXTRACTION PHACO AND INTRAOCULAR LENS PLACEMENT ([facility]) RIGHT 6.31 00:46.1;  Surgeon: Birder Robson, MD;  Location: [facility];  Service: Ophthalmology;  Laterality: Right;   COLONOSCOPY  2007   done in [place] per patient. Normal except for moderate hemorrhoids   MOUTH SURGERY  2011   STOMACH SURGERY     stapled for obesity/later reversed   Tailbone surgery     removal of coccyx   Family History  Problem Relation Age of Onset   Emphysema Mother    Kidney failure Sister    Heart Problems Brother    Diabetes Brother    Skin cancer Brother    Breast cancer Neg Hx    Social History   Socioeconomic History   Marital status: Widowed    Spouse name: Not on file   Number of children: 3   Years of education: Not on file   Highest education level: 12th grade  Occupational History   Occupation: Retired  Tobacco Use   Smoking status: Some Days    Packs/day: 1.00    Years: 30.00    Pack years: 30.00    Types: Cigarettes, E-cigarettes    Last attempt to quit: 01/11/2001    Years since quitting: 20.4    Passive exposure: Current   Smokeless tobacco: Never  Vaping Use   Vaping Use: Some days  Substance and Sexual Activity   Alcohol use: Yes    Comment:  drinks 2 wine coolers a day   Drug use: No   Sexual activity: Not on file  Other Topics Concern   Not on file  Social History Narrative   Not on file   Social Determinants of Health   Financial Resource Strain: Not on file  Food Insecurity: Not on file  Transportation Needs: Not on file  Physical Activity: Insufficiently Active   Days of Exercise per Week: 5 days   Minutes of Exercise per Session: 20 min  Stress: No Stress Concern Present  Feeling of Stress : Not at all  Social Connections: Not on file    Tobacco Counseling Ready to quit: Not Answered Counseling given: Not Answered   Clinical Intake:  Pre-visit preparation completed: Yes  Pain : No/denies pain     Diabetes: No  How often do you need to have someone help you when you read instructions, pamphlets, or other written materials from your doctor or pharmacy?: 1 - Never  Diabetic?no  Interpreter Needed?: No  Information entered by :: Kirke Shaggy, LPN   Activities of Daily Living    03/06/2021    9:27 AM 02/05/2021    3:30 PM  In your present state of health, do you have any difficulty performing the following activities:  Hearing? 1   Vision? 1   Difficulty concentrating or making decisions? 1   Walking or climbing stairs? 1   Dressing or bathing? 0   Doing errands, shopping? 1      Information is confidential and restricted. Go to Review Flowsheets to unlock data.    Patient Care Team: Birdie Sons, MD as PCP - General (Family Medicine) Birder Robson, MD as Referring Physician (Ophthalmology)  Indicate any recent Medical Services you may have received from other than [facility] providers in the past year (date may be approximate).     Assessment:   This is a routine wellness examination for Joan Baldwin.  Hearing/Vision screen No results found.  Dietary issues and exercise activities discussed:     Goals Addressed             This Visit's Progress    DIET - EAT MORE FRUITS AND  VEGETABLES         Depression Screen    06/16/2021    1:29 PM 04/07/2021    3:20 PM 03/06/2021    9:27 AM 08/19/2020    9:55 AM 01/08/2020    1:25 PM 08/14/2019    9:42 AM 08/30/2018   10:24 AM  PHQ 2/9 Scores  PHQ - 2 Score '1 2 6 4 2 4 '$ 0  PHQ- 9 Score '1 6 17 11 5 11 1    '$ Fall Risk    03/06/2021    9:26 AM 08/19/2020    9:54 AM 08/14/2019    9:50 AM 08/30/2018   10:25 AM 06/06/2018    9:26 AM  Fall Risk   Falls in the past year? 1 1 0 1 1  Number falls in past yr: 1 0 0 0 0  Injury with Fall? 1 0 0 0 0  Risk for fall due to : History of fall(s);Impaired balance/gait;Impaired mobility History of fall(s)     Follow up Falls evaluation completed;Education provided;Falls prevention discussed Falls evaluation completed  Falls evaluation completed Falls prevention discussed    FALL RISK PREVENTION PERTAINING TO THE HOME:  Any stairs in or around the home? No  If so, are there any without handrails? No  Home free of loose throw rugs in walkways, pet beds, electrical cords, etc? Yes  Adequate lighting in your home to reduce risk of falls? Yes   ASSISTIVE DEVICES UTILIZED TO PREVENT FALLS:  Life alert? No  Use of a cane, walker or w/c? Yes  Grab bars in the bathroom? Yes  Shower chair or bench in shower? Yes  Elevated toilet seat or a handicapped toilet? Yes     Cognitive Function: declined to test        08/14/2019    9:53 AM 06/06/2018    9:31  AM 05/26/2016    9:14 AM  6CIT Screen  What Year? 0 points 0 points 0 points  What month? 0 points 0 points 0 points  What time? 0 points 0 points 0 points  Count back from 20 0 points 0 points 0 points  Months in reverse 0 points 2 points 0 points  Repeat phrase 2 points 0 points 6 points  Total Score 2 points 2 points 6 points    Immunizations Immunization History  Administered Date(s) Administered   Fluad Quad(high Dose 65+) 09/25/2019, 03/06/2021   Hepatitis B, adult 10/23/2018, 12/25/2018   Hepb-cpg 06/01/2019   Influenza,  High Dose Seasonal PF 11/26/2014, 09/05/2015, 10/10/2016, 10/03/2017   Influenza-Unspecified 10/12/2018   PFIZER(Purple Top)SARS-COV-2 Vaccination 04/17/2019, 05/08/2019   Pneumococcal Conjugate-13 11/26/2014   Pneumococcal Polysaccharide-23 09/10/2008, 12/19/2009   Zoster Recombinat (Shingrix) 08/22/2018, 11/01/2018   Zoster, Live 11/21/2012    TDAP status: Due, Education has been provided regarding the importance of this vaccine. Advised may receive this vaccine at local pharmacy or Health Dept. Aware to provide a copy of the vaccination record if obtained from local pharmacy or Health Dept. Verbalized acceptance and understanding.  Flu Vaccine status: Up to date  Pneumococcal vaccine status: Up to date  Covid-19 vaccine status: Completed vaccines  Qualifies for Shingles Vaccine? Yes   Zostavax completed Yes   Shingrix Completed?: Yes  Screening Tests Health Maintenance  Topic Date Due   COVID-19 Vaccine (3 - Pfizer risk series) 06/05/2019   TETANUS/TDAP  03/06/2022 (Originally 02/08/1964)   INFLUENZA VACCINE  08/11/2021   COLONOSCOPY (Pts 45-53yr Insurance coverage will need to be confirmed)  12/23/2021   DEXA SCAN  05/01/2023   Pneumonia Vaccine 76 Years old  Completed   Hepatitis C Screening  Completed   Zoster Vaccines- Shingrix  Completed   HPV VACCINES  Aged Out    Health Maintenance  Health Maintenance Due  Topic Date Due   COVID-19 Vaccine (3 - Pfizer risk series) 06/05/2019    Colorectal cancer screening: No longer required.   Mammogram status: No longer required due to age.  Bone Density status: Completed 04/30/21. Results reflect: Bone density results: OSTEOPOROSIS. Repeat every 2 years.  Lung Cancer Screening: (Low Dose CT Chest recommended if Age 78-80years, 30 pack-year currently smoking OR have quit w/in 15years.) does not qualify.   Additional Screening:  Hepatitis C Screening: does qualify; Completed 06/01/19  Vision Screening: Recommended  annual ophthalmology exams for early detection of glaucoma and other disorders of the eye. Is the patient up to date with their annual eye exam?  Yes  Who is the provider or what is the name of the office in which the patient attends annual eye exams[facility] a provider, would they like to be referred to a provider to establish care? No .   Dental Screening: Recommended annual dental exams for proper oral hygiene  Community Resource Referral / Chronic Care Management: CRR required this visit?  No   CCM required this visit?  No      Plan:     I have personally reviewed and noted the following in the patient's chart:   Medical and social history Use of alcohol, tobacco or illicit drugs  Current medications and supplements including opioid prescriptions.  Functional ability and status Nutritional status Physical activity Advanced directives List of other physicians Hospitalizations, surgeries, and ER visits in previous 12 months Vitals Screenings to include cognitive, depression, and falls Referrals  and appointments  In addition, I have reviewed and discussed with patient certain preventive protocols, quality metrics, and best practice recommendations. A written personalized care plan for preventive services as well as general preventive health recommendations were provided to patient.     Dionisio David, LPN   03/13/9922   Nurse Notes: none

## 2021-06-16 NOTE — Patient Instructions (Signed)
Joan Baldwin , Thank you for taking time to come for your Medicare Wellness Visit. I appreciate your ongoing commitment to your health goals. Please review the following plan we discussed and let me know if I can assist you in the future.   Screening recommendations/referrals: Colonoscopy: aged out Mammogram: aged out Bone Density: 04/30/21 Recommended yearly ophthalmology/optometry visit for glaucoma screening and checkup Recommended yearly dental visit for hygiene and checkup  Vaccinations: Influenza vaccine: 03/06/21 Pneumococcal vaccine: 11/26/14 Tdap vaccine: n/d Shingles vaccine: Shingrix 08/22/18, 11/01/18  Zostavax 11/21/12   Covid-19:04/17/19, 05/08/19  Advanced directives: no, requested info, will mail  Conditions/risks identified: no  Next appointment: Follow up in one year for your annual wellness visit 06/21/22 @ 1pm by phone   Preventive Care 78 Years and Older, Female Preventive care refers to lifestyle choices and visits with your health care provider that can promote health and wellness. What does preventive care include? A yearly physical exam. This is also called an annual well check. Dental exams once or twice a year. Routine eye exams. Ask your health care provider how often you should have your eyes checked. Personal lifestyle choices, including: Daily care of your teeth and gums. Regular physical activity. Eating a healthy diet. Avoiding tobacco and drug use. Limiting alcohol use. Practicing safe sex. Taking low-dose aspirin every day. Taking vitamin and mineral supplements as recommended by your health care provider. What happens during an annual well check? The services and screenings done by your health care provider during your annual well check will depend on your age, overall health, lifestyle risk factors, and family history of disease. Counseling  Your health care provider may ask you questions about your: Alcohol use. Tobacco use. Drug  use. Emotional well-being. Home and relationship well-being. Sexual activity. Eating habits. History of falls. Memory and ability to understand (cognition). Work and work Statistician. Reproductive health. Screening  You may have the following tests or measurements: Height, weight, and BMI. Blood pressure. Lipid and cholesterol levels. These may be checked every 5 years, or more frequently if you are over 37 years old. Skin check. Lung cancer screening. You may have this screening every year starting at age 78 if you have a 30-pack-year history of smoking and currently smoke or have quit within the past 15 years. Fecal occult blood test (FOBT) of the stool. You may have this test every year starting at age 78. Flexible sigmoidoscopy or colonoscopy. You may have a sigmoidoscopy every 5 years or a colonoscopy every 10 years starting at age 78. Hepatitis C blood test. Hepatitis B blood test. Sexually transmitted disease (STD) testing. Diabetes screening. This is done by checking your blood sugar (glucose) after you have not eaten for a while (fasting). You may have this done every 1-3 years. Bone density scan. This is done to screen for osteoporosis. You may have this done starting at age 78. Mammogram. This may be done every 1-2 years. Talk to your health care provider about how often you should have regular mammograms. Talk with your health care provider about your test results, treatment options, and if necessary, the need for more tests. Vaccines  Your health care provider may recommend certain vaccines, such as: Influenza vaccine. This is recommended every year. Tetanus, diphtheria, and acellular pertussis (Tdap, Td) vaccine. You may need a Td booster every 10 years. Zoster vaccine. You may need this after age 49. Pneumococcal 13-valent conjugate (PCV13) vaccine. One dose is recommended after age 55. Pneumococcal polysaccharide (PPSV23) vaccine. One dose is  recommended after age  8. Talk to your health care provider about which screenings and vaccines you need and how often you need them. This information is not intended to replace advice given to you by your health care provider. Make sure you discuss any questions you have with your health care provider. Document Released: 01/24/2015 Document Revised: 09/17/2015 Document Reviewed: 10/29/2014 Elsevier Interactive Patient Education  2017 [place] Prevention in the Home Falls can cause injuries. They can happen to people of all ages. There are many things you can do to make your home safe and to help prevent falls. What can I do on the outside of my home? Regularly fix the edges of walkways and driveways and fix any cracks. Remove anything that might make you trip as you walk through a door, such as a raised step or threshold. Trim any bushes or trees on the path to your home. Use bright outdoor lighting. Clear any walking paths of anything that might make someone trip, such as rocks or tools. Regularly check to see if handrails are loose or broken. Make sure that both sides of any steps have handrails. Any raised decks and porches should have guardrails on the edges. Have any leaves, snow, or ice cleared regularly. Use sand or salt on walking paths during winter. Clean up any spills in your garage right away. This includes oil or grease spills. What can I do in the bathroom? Use night lights. Install grab bars by the toilet and in the tub and shower. Do not use towel bars as grab bars. Use non-skid mats or decals in the tub or shower. If you need to sit down in the shower, use a plastic, non-slip stool. Keep the floor dry. Clean up any water that spills on the floor as soon as it happens. Remove soap buildup in the tub or shower regularly. Attach bath mats securely with double-sided non-slip rug tape. Do not have throw rugs and other things on the floor that can make you trip. What can I do in the  bedroom? Use night lights. Make sure that you have a light by your bed that is easy to reach. Do not use any sheets or blankets that are too big for your bed. They should not hang down onto the floor. Have a firm chair that has side arms. You can use this for support while you get dressed. Do not have throw rugs and other things on the floor that can make you trip. What can I do in the kitchen? Clean up any spills right away. Avoid walking on wet floors. Keep items that you use a lot in easy-to-reach places. If you need to reach something above you, use a strong step stool that has a grab bar. Keep electrical cords out of the way. Do not use floor polish or wax that makes floors slippery. If you must use wax, use non-skid floor wax. Do not have throw rugs and other things on the floor that can make you trip. What can I do with my stairs? Do not leave any items on the stairs. Make sure that there are handrails on both sides of the stairs and use them. Fix handrails that are broken or loose. Make sure that handrails are as long as the stairways. Check any carpeting to make sure that it is firmly attached to the stairs. Fix any carpet that is loose or worn. Avoid having throw rugs at the top or bottom of the stairs. If  you do have throw rugs, attach them to the floor with carpet tape. Make sure that you have a light switch at the top of the stairs and the bottom of the stairs. If you do not have them, ask someone to add them for you. What else can I do to help prevent falls? Wear shoes that: Do not have high heels. Have rubber bottoms. Are comfortable and fit you well. Are closed at the toe. Do not wear sandals. If you use a stepladder: Make sure that it is fully opened. Do not climb a closed stepladder. Make sure that both sides of the stepladder are locked into place. Ask someone to hold it for you, if possible. Clearly mark and make sure that you can see: Any grab bars or  handrails. First and last steps. Where the edge of each step is. Use tools that help you move around (mobility aids) if they are needed. These include: Canes. Walkers. Scooters. Crutches. Turn on the lights when you go into a dark area. Replace any light bulbs as soon as they burn out. Set up your furniture so you have a clear path. Avoid moving your furniture around. If any of your floors are uneven, fix them. If there are any pets around you, be aware of where they are. Review your medicines with your doctor. Some medicines can make you feel dizzy. This can increase your chance of falling. Ask your doctor what other things that you can do to help prevent falls. This information is not intended to replace advice given to you by your health care provider. Make sure you discuss any questions you have with your health care provider. Document Released: 10/24/2008 Document Revised: 06/05/2015 Document Reviewed: 02/01/2014 Elsevier Interactive Patient Education  2017 [employer].

## 2021-06-18 ENCOUNTER — Telehealth: Payer: Self-pay

## 2021-06-18 NOTE — Telephone Encounter (Signed)
Copied from [place] [id number]. Topic: Referral - Question >> Jun 18, 2021 12:26 PM Rudene Anda wrote: Reason for CRM: Sharyn Lull called regarding the pt asking if a referral could be sent in to a neurologist due to her memory issues, please advise.

## 2021-06-18 NOTE — Telephone Encounter (Signed)
Please Review

## 2021-06-19 ENCOUNTER — Ambulatory Visit: Payer: Self-pay | Admitting: *Deleted

## 2021-06-19 ENCOUNTER — Telehealth: Payer: Self-pay

## 2021-06-19 NOTE — Telephone Encounter (Signed)
Copied from [place]. Topic: Referral - Request for Referral >> Jun 19, 2021 12:26 PM Sabas Sous wrote: Has patient seen PCP for this complaint? No *If NO, is insurance requiring patient see PCP for this issue before PCP can refer them? Referral for which specialty: Neurology  Preferred provider/office: Highest recommended  Reason for referral: Pt says she "feels forgetful, feels like her brain is rattled. Concerned she may have a tumor" -transferred to triage

## 2021-06-19 NOTE — Telephone Encounter (Signed)
  Chief Complaint: Forgetfulness Symptoms: Pt states friends and family are telling her she keeps forgetting things and repeating herself. States forgets familiar names and places. States son wants her to be seen by neurologist. Frequency: Jan. 2023, worsening Pertinent Negatives: Patient denies any new medications, no physical symptoms, recent illness. Disposition: '[]'$ ED /'[]'$ Urgent Care (no appt availability in office) / '[]'$ Appointment(In office/virtual)/ '[]'$  [facility]/ '[]'$ Home Care/ '[x]'$ Refused Recommended Disposition /'[]'$ [facility]/ '[]'$  Follow-up with PCP Additional Notes: Attempted to secure appt for pt, declines "My son will have to do that so they can take me." Not home presently. Attempted to secure and advised they can call to reschedule if needed, pt declined. Advised to have son call for appt.   Reason for Disposition  New or worsening memory (forgetfulness) problems  Answer Assessment - Initial Assessment Questions 1. MAIN CONCERN OR SYMPTOM:  "What is your main concern right now?" "What questions do you have?" "What's the main symptom you're worried about?" (e.g., confusion, memory loss)     Forgetful, repeating myself 2. ONSET:  "When did the symptom start (or worsen)?" (minutes, hours, days, weeks)     Jan. 2023 3. BETTER-SAME-WORSE: "Are you (the patient) getting better, staying the same, or getting worse compared to the day you (they) were diagnosed or most recent hospital discharge ?"     Worsening 4. DIAGNOSIS: "Was the dementia diagnosed by a doctor?" If Yes, ask: "When?" (e.g., days, months, years ago)     No 5. MEDICATION: "Has there been any change in medicines recently?" (e.g., narcotics, antihistamines, benzodiazepines, etc.)     None 6. OTHER SYMPTOMS: "Are there any other symptoms?" (e.g., fever, cough, pain, falling)     No 7. SUPPORT: Document living circumstances and support (e.g., family, nursing home)     Lives with son and daughter in law.  Supportive  Protocols used: Dementia Symptoms and Questions-A-AH

## 2021-06-19 NOTE — Telephone Encounter (Signed)
See triage note. ER follow up scheduled for 06/26/2021.

## 2021-06-19 NOTE — Telephone Encounter (Signed)
I called and spoke with patient's son Wilfred Lacy. Wilfred Lacy informed me that patient was seen in the ER on 06/11/2021 for confusion. A neurology referral was recommended during ER visit. Wilfred Lacy agrees to scheduled ER f/u appointment which is scheduled for 06/26/2021 at 1:20pm with Dr. Caryn Section.

## 2021-06-24 NOTE — Progress Notes (Signed)
I,Sha'taria Tyson,acting as a Education administrator for Lelon Huh, MD.,have documented all relevant documentation on the behalf of Lelon Huh, MD,as directed by  Lelon Huh, MD while in the presence of Lelon Huh, MD.    Established patient visit   Patient: Joan Baldwin   DOB: 1945-04-04   76 y.o. Female  MRN: [id number] Visit Date: 06/26/2021  Today's healthcare provider: Lelon Huh, MD   No chief complaint on file.  Subjective    HPI  Son reports attention-span is terrible and her long term memory is fine. It is her short term memory that he is concerned about.  Follow up ER visit  Patient was seen in ER for confusion on 06/11/2021. She is noted to have had drop in her sodium to 128 from a baseline of 138. Nomal CBC. Negative u/a and unremarkable non contrast head CT.  Treatment for this included recommended referral to Neurology.  She reports this condition is Unchanged.  -----------------------------------------------------------------------------------------   Medications: Outpatient Medications Prior to Visit  Medication Sig   amLODipine (NORVASC) 10 MG tablet Take 1 tablet (10 mg total) by mouth daily.   Calcium Carbonate-Vitamin D (CALCIUM 600/VITAMIN D PO) Take 1 tablet by mouth daily.    diazepam (VALIUM) 10 MG tablet Take 0.5-1 tablets (5-10 mg total) by mouth 4 (four) times daily as needed for anxiety. PLEASE CANCEL ALL REMAINING REFILLS OF THE '5MG'$  TABLETS.   Fiber POWD Take 2 scoop by mouth daily.   FLUoxetine (PROZAC) 20 MG capsule Take 1 capsule (20 mg total) by mouth daily.   HYDROcodone-acetaminophen (NORCO/VICODIN) 5-325 MG tablet Take 1 tablet by mouth every 6 (six) hours as needed.   latanoprost (XALATAN) 0.005 % ophthalmic solution Place 1 drop into both eyes at bedtime.   lidocaine (LIDODERM) 5 % Place 1 patch onto the skin every 12 (twelve) hours. Remove & Discard patch within 12 hours or as directed by MD   lidocaine (XYLOCAINE) 2 % solution  USE AS DIRECTED 15ML IN MOUTH OR THROAT EVERY 4-6 HOURS AS NEEDED FOR MOUTH PAIN   lisinopril (ZESTRIL) 20 MG tablet Take 1 tablet (20 mg total) by mouth daily.   Multiple Vitamins-Minerals (CENTRUM SILVER) tablet    Multiple Vitamins-Minerals (MULTIVITAMIN ADULT PO) Take 1 tablet by mouth daily.   Omega-3 Fatty Acids (FISH OIL) 1000 MG CAPS Take 1 capsule by mouth daily.   omeprazole (PRILOSEC) 20 MG capsule Take 1 capsule (20 mg total) by mouth daily.   QUEtiapine (SEROQUEL) 50 MG tablet Take 1 tablet (50 mg total) by mouth at bedtime.   timolol (TIMOPTIC) 0.5 % ophthalmic solution timolol maleate 0.5 % eye drops  INSTILL 1 DROP ONCE DAILY INTO EACH EYE   traZODone (DESYREL) 100 MG tablet TAKE 1 TABLET BY MOUTH AT BEDTIME   No facility-administered medications prior to visit.         Objective    BP (!) 112/55   Pulse (!) 101   Wt 142 lb (64.4 kg)   SpO2 99%   BMI 22.92 kg/m    Physical Exam   General: Appearance:    Well developed, well nourished female in no acute distress  Eyes:    PERRL, conjunctiva/corneas clear, EOM's intact       Lungs:     Clear to auscultation bilaterally, respirations unlabored  Heart:    Tachycardic. Normal rhythm. No murmurs, rubs, or gallops.    MS:   All extremities are intact.    Neurologic:  Awake, alert, oriented x 3. No apparent focal neurological defect.         Assessment & Plan     1. Hyponatremia  - Renal function panel - TSH - Osmolality - Sodium, urine, random - Osmolality, urine - Vitamin B12 2. Confusion  Likely secondary to or exacerbated by hyponatremia as above       The entirety of the information documented in the History of Present Illness, Review of Systems and Physical Exam were personally obtained by me. Portions of this information were initially documented by the CMA and reviewed by me for thoroughness and accuracy.     Lelon Huh, MD  [facility] [phone] (phone) [phone]  (fax)  [place]

## 2021-06-26 ENCOUNTER — Encounter: Payer: Self-pay | Admitting: Family Medicine

## 2021-06-26 ENCOUNTER — Ambulatory Visit (INDEPENDENT_AMBULATORY_CARE_PROVIDER_SITE_OTHER): Payer: Medicare Other | Admitting: Family Medicine

## 2021-06-26 VITALS — BP 112/55 | HR 101 | Wt 142.0 lb

## 2021-06-26 DIAGNOSIS — R410 Disorientation, unspecified: Secondary | ICD-10-CM

## 2021-06-26 DIAGNOSIS — E871 Hypo-osmolality and hyponatremia: Secondary | ICD-10-CM | POA: Diagnosis not present

## 2021-06-28 LAB — OSMOLALITY, URINE: Osmolality, Ur: 396 mOsmol/kg

## 2021-06-28 LAB — SODIUM, URINE, RANDOM: Sodium, Ur: 71 mmol/L

## 2021-06-29 ENCOUNTER — Other Ambulatory Visit: Payer: Self-pay | Admitting: Family Medicine

## 2021-06-29 DIAGNOSIS — I10 Essential (primary) hypertension: Secondary | ICD-10-CM

## 2021-06-29 DIAGNOSIS — F419 Anxiety disorder, unspecified: Secondary | ICD-10-CM

## 2021-06-29 LAB — RENAL FUNCTION PANEL
Albumin: 4.6 g/dL (ref 3.7–4.7)
BUN/Creatinine Ratio: 15 (ref 12–28)
BUN: 14 mg/dL (ref 8–27)
CO2: 25 mmol/L (ref 20–29)
Calcium: 10.1 mg/dL (ref 8.7–10.3)
Chloride: 95 mmol/L — ABNORMAL LOW (ref 96–106)
Creatinine, Ser: 0.91 mg/dL (ref 0.57–1.00)
Glucose: 147 mg/dL — ABNORMAL HIGH (ref 70–99)
Phosphorus: 4.6 mg/dL — ABNORMAL HIGH (ref 3.0–4.3)
Potassium: 4.4 mmol/L (ref 3.5–5.2)
Sodium: 137 mmol/L (ref 134–144)
eGFR: 65 mL/min/{1.73_m2} (ref >59)

## 2021-06-29 LAB — OSMOLALITY: Osmolality Meas: 281 mOsmol/kg (ref 280–301)

## 2021-06-29 LAB — TSH: TSH: 1.1 u[IU]/mL (ref 0.450–4.500)

## 2021-06-29 LAB — VITAMIN B12: Vitamin B-12: 1445 pg/mL — ABNORMAL HIGH (ref 232–1245)

## 2021-06-29 NOTE — Telephone Encounter (Signed)
Medication Refill - Medication: amLODipine (NORVASC) 10 MG tablet, diazepam (VALIUM) 10 MG tablet, FLUoxetine (PROZAC) 20 MG capsule  Has the patient contacted their pharmacy? Yes.    Preferred Pharmacy (with phone number or street name): [facility], [place]  Has the patient been seen for an appointment in the last year OR does the patient have an upcoming appointment? Yes.    Agent: Please be advised that RX refills may take up to 3 business days. We ask that you follow-up with your pharmacy.  The patient did contact the pharmacy on Saturday June 17th but not showing request in chart. Please assist patient further.

## 2021-06-30 ENCOUNTER — Telehealth: Payer: Self-pay | Admitting: Family Medicine

## 2021-06-30 ENCOUNTER — Other Ambulatory Visit: Payer: Self-pay

## 2021-06-30 DIAGNOSIS — I10 Essential (primary) hypertension: Secondary | ICD-10-CM

## 2021-06-30 NOTE — Telephone Encounter (Signed)
Pt returned call. Shared provider's note with pt. PT seemed unsure of conversation. Pt declined Neurologist referal.   Phone was given to Roosvelt Harps to finish discussion.  Daughter in-law will cut 20 mg lisinopril in half until they are used up. Then they will start the 10 mg tablets.   PT already has an appt scheduled for 7/7. Is this ok for follow up?  Sharyn Lull would like her mother in law to see the neurologist.  Sharyn Lull states that pt though this was a psychiatrist and she will not see a psychiatrist. Please place this referral.  Birdie Sons, MD  06/30/2021 12:50 PM EDT     Sodium levels are back to normal. Rest of labs are normal.  Considering low blood pressure recommend change lisinopril to '10mg'$  tablet once daily. Can send prescription for #90 rf x 1 to her pharmacy.  Recommend refer neurology for memory problems.  Need to schedule follow up in 2 months for meds and blood pressure check.

## 2021-06-30 NOTE — Telephone Encounter (Signed)
Requested medication (s) are due for refill today:       yes   all  Requested medication (s) are on the active medication list: yes  Last refill: Amlodipine  02/10/21  #30  2 refills      Prozac  02/10/21  #30  2 refills      Valium  04/07/21  #100  2 refills  Future visit scheduled yes 07/17/21  Notes to clinic:Amlodipine and Prozac  by historical provider.  Valium not delegated. Please review  Requested Prescriptions  Pending Prescriptions Disp Refills   amLODipine (NORVASC) 10 MG tablet 30 tablet 2    Sig: Take 1 tablet (10 mg total) by mouth daily.     Cardiovascular: Calcium Channel Blockers 2 Passed - 06/29/2021 10:58 AM      Passed - Last BP in normal range    BP Readings from Last 1 Encounters:  06/26/21 (!) 112/55         Passed - Last Heart Rate in normal range    Pulse Readings from Last 1 Encounters:  06/26/21 (!) 101         Passed - Valid encounter within last 6 months    Recent Outpatient Visits           4 days ago Hyponatremia   [facility] Birdie Sons, MD   2 months ago [place], Donald E, MD   3 months ago [place], Donald E, MD   6 months ago [place], Donald E, MD   10 months ago [place], MD       Future Appointments             In 2 weeks Fisher, Kirstie Peri, MD [facility]             diazepam (VALIUM) 10 MG tablet 100 tablet 2    Sig: Take 0.5-1 tablets (5-10 mg total) by mouth 4 (four) times daily as needed for anxiety. PLEASE CANCEL ALL REMAINING REFILLS OF THE '5MG'$  TABLETS.     Not Delegated - Psychiatry: Anxiolytics/Hypnotics 2 Failed - 06/29/2021 10:58 AM      Failed - This refill cannot be delegated      Passed - Urine Drug Screen completed in last 360 days      Passed - Patient is not pregnant      Passed - Valid encounter within last 6 months     Recent Outpatient Visits           4 days ago Hyponatremia   [facility] Birdie Sons, MD   2 months ago [place], Donald E, MD   3 months ago [place], Donald E, MD   6 months ago [place], Donald E, MD   10 months ago [place], MD       Future Appointments             In 2 weeks Fisher, Kirstie Peri, MD [facility]             FLUoxetine (PROZAC) 20 MG capsule 30 capsule 2    Sig: Take 1 capsule (20 mg total) by mouth daily.     Psychiatry:  Antidepressants -  SSRI Passed - 06/29/2021 10:58 AM      Passed - Completed PHQ-2 or PHQ-9 in the last 360 days      Passed - Valid encounter within last 6 months    Recent Outpatient Visits           4 days ago Hyponatremia   [facility] Birdie Sons, MD   2 months ago [place], Donald E, MD   3 months ago [place], Donald E, MD   6 months ago [place], Donald E, MD   10 months ago [place], Donald E, MD       Future Appointments             In 2 weeks Fisher, Kirstie Peri, MD [facility] [place]

## 2021-06-30 NOTE — Telephone Encounter (Signed)
Pt made another call, wants to know her latest lab results, please advise.

## 2021-06-30 NOTE — Telephone Encounter (Signed)
Pt calling back to check status.

## 2021-06-30 NOTE — Telephone Encounter (Signed)
Patient called in waiting on status of lab results , done 06/16. Please call back.

## 2021-07-01 ENCOUNTER — Telehealth: Payer: Self-pay

## 2021-07-01 NOTE — Addendum Note (Signed)
Addended by: Doristine Devoid on: 07/01/2021 09:32 AM   Modules accepted: Orders

## 2021-07-01 NOTE — Telephone Encounter (Signed)
Copied from [place] [id number]. Topic: General - Inquiry >> Jun 29, 2021 10:44 AM Erskine Squibb wrote: Reason for CRM: Patient called in to check status of her lab results that she had done on Friday. Please assist patient further

## 2021-07-01 NOTE — Telephone Encounter (Signed)
Done

## 2021-07-01 NOTE — Telephone Encounter (Signed)
See other CRM/telephone encounters.

## 2021-07-01 NOTE — Telephone Encounter (Signed)
Copied from [place] [id number]. Topic: General - Other >> Jun 29, 2021  4:10 PM Everette C wrote: Reason for CRM: The patient would like to be contacted at [phone] when their lab results return   Please contact further when available

## 2021-07-01 NOTE — Telephone Encounter (Signed)
Patient's daughter in law Roosvelt Harps (on [place]) also requesting refill on Diazepam, and fluoxetine to be send together with Lisinopril. Appointment for July ws canceled and scheduled patient a 2 month follow-up

## 2021-07-02 MED ORDER — FLUOXETINE HCL 20 MG PO CAPS: 20.0000 mg | ORAL_CAPSULE | Freq: Every day | ORAL | 4 refills | Status: DC

## 2021-07-02 MED ORDER — DIAZEPAM 10 MG PO TABS: 5.0000 mg | ORAL_TABLET | Freq: Four times a day (QID) | ORAL | 2 refills | Status: DC | PRN

## 2021-07-02 MED ORDER — AMLODIPINE BESYLATE 10 MG PO TABS: 10.0000 mg | ORAL_TABLET | Freq: Every day | ORAL | 3 refills | Status: DC

## 2021-07-02 MED ORDER — LISINOPRIL 10 MG PO TABS: 10.0000 mg | ORAL_TABLET | Freq: Every day | ORAL | 4 refills | Status: DC

## 2021-07-17 ENCOUNTER — Ambulatory Visit: Payer: Medicare Other | Admitting: Family Medicine

## 2021-08-20 ENCOUNTER — Other Ambulatory Visit: Payer: Self-pay | Admitting: Family Medicine

## 2021-08-20 DIAGNOSIS — G4700 Insomnia, unspecified: Secondary | ICD-10-CM

## 2021-08-27 ENCOUNTER — Other Ambulatory Visit: Payer: Self-pay | Admitting: Family Medicine

## 2021-08-27 DIAGNOSIS — F419 Anxiety disorder, unspecified: Secondary | ICD-10-CM

## 2021-08-27 NOTE — Telephone Encounter (Signed)
Last refill: 07/02/2021 qty 100 with 2 refills  Last office visit for anxiety f/u 04/07/2021. Was seen on 06/26/2021 for acute issue.  Next office visit: 08/31/2021

## 2021-08-31 ENCOUNTER — Ambulatory Visit (INDEPENDENT_AMBULATORY_CARE_PROVIDER_SITE_OTHER): Payer: Medicare Other | Admitting: Family Medicine

## 2021-08-31 ENCOUNTER — Encounter: Payer: Self-pay | Admitting: Family Medicine

## 2021-08-31 VITALS — BP 109/57 | HR 94 | Temp 98.7°F | Wt 136.0 lb

## 2021-08-31 DIAGNOSIS — I10 Essential (primary) hypertension: Secondary | ICD-10-CM

## 2021-08-31 DIAGNOSIS — R270 Ataxia, unspecified: Secondary | ICD-10-CM | POA: Diagnosis not present

## 2021-08-31 DIAGNOSIS — R296 Repeated falls: Secondary | ICD-10-CM | POA: Diagnosis not present

## 2021-08-31 MED ORDER — AMLODIPINE BESYLATE 5 MG PO TABS: 5.0000 mg | ORAL_TABLET | Freq: Every day | ORAL | 1 refills | Status: DC

## 2021-08-31 NOTE — Progress Notes (Unsigned)
I,Roshena L Chambers,acting as a scribe for Lelon Huh, MD.,have documented all relevant documentation on the behalf of Lelon Huh, MD,as directed by  Lelon Huh, MD while in the presence of Lelon Huh, MD.   Established patient visit   Patient: Joan Baldwin   DOB: Mar 21, 1945   76 y.o. Female  MRN: [id number] Visit Date: 08/31/2021  Today's healthcare provider: Lelon Huh, MD   Chief Complaint  Patient presents with   Hypertension   Subjective    HPI  Hypertension, follow-up  BP Readings from Last 3 Encounters:  08/31/21 (!) 109/57  06/26/21 (!) 112/55  06/11/21 (!) 99/48   Wt Readings from Last 3 Encounters:  08/31/21 136 lb (61.7 kg)  06/26/21 142 lb (64.4 kg)  06/16/21 143 lb (64.9 kg)     She was last seen for hypertension 2 months ago.  BP at that visit was 112/55. Management since that visit includes changing the dose of lisinopril from 66m daily to 10 mg daily and recommendation to see neurology for evaluation of ataxia/frequent falls. She refused referral to neurology  Her daughter is with her today and reports that she continues to have trouble with her balance and continues to have falls, has not hurt herself recently.   She reports good compliance with treatment. She is not having side effects.  She is following a Regular diet. She is not exercising. She does smoke.  Use of agents associated with hypertension: none.   Outside blood pressures are checked occasionally. Symptoms: No chest pain No chest pressure  No palpitations No syncope  No dyspnea No orthopnea  No paroxysmal nocturnal dyspnea No lower extremity edema   Pertinent labs Lab Results  Component Value Date   CHOL 186 02/10/2021   HDL 35 (L) 02/10/2021   LDLCALC 129 (H) 02/10/2021   TRIG 112 02/10/2021   CHOLHDL 5.3 02/10/2021   Lab Results  Component Value Date   NA 137 06/26/2021   K 4.4 06/26/2021   CREATININE 0.91 06/26/2021   EGFR 65 06/26/2021    GLUCOSE 147 (H) 06/26/2021   TSH 1.100 06/26/2021     The 10-year ASCVD risk score (Arnett DK, et al., 2019) is: 26.7%  ---------------------------------------------------------------------------------------------------   Medications: Outpatient Medications Prior to Visit  Medication Sig   amLODipine (NORVASC) 10 MG tablet Take 1 tablet (10 mg total) by mouth daily.   Calcium Carbonate-Vitamin D (CALCIUM 600/VITAMIN D PO) Take 1 tablet by mouth daily.    diazepam (VALIUM) 10 MG tablet TAKE 1/2 TO 1 (ONE-HALF TO ONE) TABLET BY MOUTH 4 TIMES DAILY AS NEEDED FOR  ANXIETY   Fiber POWD Take 2 scoop by mouth daily.   FLUoxetine (PROZAC) 20 MG capsule Take 1 capsule (20 mg total) by mouth daily.   HYDROcodone-acetaminophen (NORCO/VICODIN) 5-325 MG tablet Take 1 tablet by mouth every 6 (six) hours as needed.   latanoprost (XALATAN) 0.005 % ophthalmic solution Place 1 drop into both eyes at bedtime.   lidocaine (LIDODERM) 5 % Place 1 patch onto the skin every 12 (twelve) hours. Remove & Discard patch within 12 hours or as directed by MD   lidocaine (XYLOCAINE) 2 % solution USE AS DIRECTED 15ML IN MOUTH OR THROAT EVERY 4-6 HOURS AS NEEDED FOR MOUTH PAIN   lisinopril (ZESTRIL) 10 MG tablet Take 1 tablet (10 mg total) by mouth daily.   Multiple Vitamins-Minerals (CENTRUM SILVER) tablet    Multiple Vitamins-Minerals (MULTIVITAMIN ADULT PO) Take 1 tablet by mouth daily.  Omega-3 Fatty Acids (FISH OIL) 1000 MG CAPS Take 1 capsule by mouth daily.   omeprazole (PRILOSEC) 20 MG capsule Take 1 capsule (20 mg total) by mouth daily.   QUEtiapine (SEROQUEL) 50 MG tablet Take 1 tablet (50 mg total) by mouth at bedtime.   timolol (TIMOPTIC) 0.5 % ophthalmic solution timolol maleate 0.5 % eye drops  INSTILL 1 DROP ONCE DAILY INTO EACH EYE   traZODone (DESYREL) 100 MG tablet TAKE 1 TABLET BY MOUTH AT BEDTIME   [DISCONTINUED] lisinopril (ZESTRIL) 20 MG tablet Take 1 tablet (20 mg total) by mouth daily. (Patient  not taking: Reported on 08/31/2021)   No facility-administered medications prior to visit.    Review of Systems  Constitutional:  Negative for appetite change, chills, fatigue and fever.  Respiratory:  Negative for chest tightness and shortness of breath.   Cardiovascular:  Negative for chest pain and palpitations.  Gastrointestinal:  Negative for abdominal pain, nausea and vomiting.  Neurological:  Positive for dizziness and headaches. Negative for weakness.    {Labs  Heme  Chem  Endocrine  Serology  Results Review (optional):23779}   Objective    BP (!) 109/57 (BP Location: Right Arm, Patient Position: Sitting, Cuff Size: Normal)   Pulse 94   Temp 98.7 F (37.1 C) (Oral)   Wt 136 lb (61.7 kg)   SpO2 99% Comment: room air  BMI 21.95 kg/m  {Show previous vital signs (optional):23777}  Physical Exam    General: Appearance:    Well developed, well nourished female in no acute distress  Eyes:    PERRL, conjunctiva/corneas clear, EOM's intact       Lungs:     Clear to auscultation bilaterally, respirations unlabored  Heart:    Normal heart rate. Normal rhythm. No murmurs, rubs, or gallops.    MS:   All extremities are intact.    Neurologic:   Awake, alert, oriented x 3. No apparent focal neurological defect.         Assessment & Plan     1. Essential (primary) hypertension Continues to be relatively hypotensive. Will reduce amLODipine (NORVASC) to 5 MG tablet; Take 1 tablet (5 mg total) by mouth daily.  Dispense: 90 tablet; Refill: 1  2. Ataxia She refused referral to neurology   3. Falls frequently Unclear if low BP is contributing, will see how she does with lower dose of amlodipine, but strongly encouraged to see neurologist if she does not see significantly improvement in the next few weeks.       The entirety of the information documented in the History of Present Illness, Review of Systems and Physical Exam were personally obtained by me. Portions of this  information were initially documented by the CMA and reviewed by me for thoroughness and accuracy.     Lelon Huh, MD  [facility] [phone] (phone) [phone] (fax)  [place]

## 2021-09-01 ENCOUNTER — Telehealth: Payer: Self-pay | Admitting: Family Medicine

## 2021-09-01 MED ORDER — QUETIAPINE FUMARATE 50 MG PO TABS: 50.0000 mg | ORAL_TABLET | Freq: Every day | ORAL | 2 refills | Status: DC

## 2021-09-01 NOTE — Telephone Encounter (Signed)
[place] faxed refill request for the following medications:  QUEtiapine (SEROQUEL) 50 MG tablet   Please advise.

## 2021-09-01 NOTE — Addendum Note (Signed)
Addended by: Shawna Orleans on: 09/01/2021 11:42 AM   Modules accepted: Orders

## 2021-10-05 ENCOUNTER — Other Ambulatory Visit: Payer: Self-pay

## 2021-10-05 ENCOUNTER — Emergency Department: Payer: Medicare Other

## 2021-10-05 DIAGNOSIS — S299XXA Unspecified injury of thorax, initial encounter: Secondary | ICD-10-CM | POA: Diagnosis present

## 2021-10-05 DIAGNOSIS — R0902 Hypoxemia: Secondary | ICD-10-CM | POA: Diagnosis not present

## 2021-10-05 DIAGNOSIS — S2241XA Multiple fractures of ribs, right side, initial encounter for closed fracture: Secondary | ICD-10-CM | POA: Diagnosis not present

## 2021-10-05 DIAGNOSIS — M47812 Spondylosis without myelopathy or radiculopathy, cervical region: Secondary | ICD-10-CM | POA: Diagnosis not present

## 2021-10-05 DIAGNOSIS — Z743 Need for continuous supervision: Secondary | ICD-10-CM | POA: Diagnosis not present

## 2021-10-05 DIAGNOSIS — S0011XA Contusion of right eyelid and periocular area, initial encounter: Secondary | ICD-10-CM | POA: Diagnosis not present

## 2021-10-05 DIAGNOSIS — S30811A Abrasion of abdominal wall, initial encounter: Secondary | ICD-10-CM | POA: Insufficient documentation

## 2021-10-05 DIAGNOSIS — R109 Unspecified abdominal pain: Secondary | ICD-10-CM | POA: Diagnosis not present

## 2021-10-05 DIAGNOSIS — Z043 Encounter for examination and observation following other accident: Secondary | ICD-10-CM | POA: Diagnosis not present

## 2021-10-05 DIAGNOSIS — Z9889 Other specified postprocedural states: Secondary | ICD-10-CM | POA: Diagnosis not present

## 2021-10-05 DIAGNOSIS — T1490XA Injury, unspecified, initial encounter: Secondary | ICD-10-CM | POA: Diagnosis not present

## 2021-10-05 DIAGNOSIS — I10 Essential (primary) hypertension: Secondary | ICD-10-CM | POA: Diagnosis not present

## 2021-10-05 DIAGNOSIS — W01198A Fall on same level from slipping, tripping and stumbling with subsequent striking against other object, initial encounter: Secondary | ICD-10-CM | POA: Diagnosis not present

## 2021-10-05 DIAGNOSIS — S0083XA Contusion of other part of head, initial encounter: Secondary | ICD-10-CM | POA: Diagnosis not present

## 2021-10-05 DIAGNOSIS — R140 Abdominal distension (gaseous): Secondary | ICD-10-CM | POA: Diagnosis not present

## 2021-10-05 DIAGNOSIS — M545 Low back pain, unspecified: Secondary | ICD-10-CM | POA: Diagnosis not present

## 2021-10-05 DIAGNOSIS — K6389 Other specified diseases of intestine: Secondary | ICD-10-CM | POA: Diagnosis not present

## 2021-10-05 DIAGNOSIS — R0781 Pleurodynia: Secondary | ICD-10-CM | POA: Diagnosis not present

## 2021-10-05 DIAGNOSIS — R6889 Other general symptoms and signs: Secondary | ICD-10-CM | POA: Diagnosis not present

## 2021-10-05 LAB — CBC
HCT: 38.7 % (ref 36.0–46.0)
Hemoglobin: 12.9 g/dL (ref 12.0–15.0)
MCH: 31.7 pg (ref 26.0–34.0)
MCHC: 33.3 g/dL (ref 30.0–36.0)
MCV: 95.1 fL (ref 80.0–100.0)
Platelets: 297 10*3/uL (ref 150–400)
RBC: 4.07 MIL/uL (ref 3.87–5.11)
RDW: 12.9 % (ref 11.5–15.5)
WBC: 14.1 10*3/uL — ABNORMAL HIGH (ref 4.0–10.5)
nRBC: 0 % (ref 0.0–0.2)

## 2021-10-05 MED ORDER — FENTANYL CITRATE PF 50 MCG/ML IJ SOSY
50.0000 ug | PREFILLED_SYRINGE | INTRAMUSCULAR | Status: AC | PRN
  Administered 2021-10-05 – 2021-10-06 (×2): 50 ug via INTRAVENOUS
  Filled 2021-10-05 (×2): qty 1

## 2021-10-05 NOTE — ED Provider Triage Note (Signed)
Emergency Medicine Provider Triage Evaluation Note  SKYLINN VIALPANDO , a 76 y.o. female  was evaluated in triage.  Pt complains of fall with R hip and lower back pain. Did not hit head. No LOC. Can not move or bear weight on R hip.  Review of Systems  Positive: R hip pain Negative: Head injury, LOC, CP, SOB  Physical Exam  BP (!) 117/44   Pulse 73   Temp 97.8 F (36.6 C) (Oral)   Resp 16   Ht '5\' 6"'$  (1.676 m)   Wt 61.2 kg   SpO2 98%   BMI 21.79 kg/m  Gen:   Awake, no distress   Resp:  Normal effort  MSK:   R hip TTP Other:    Medical Decision Making  Medically screening exam initiated at 9:21 PM.  Appropriate orders placed.  [place] was informed that the remainder of the evaluation will be completed by another provider, this initial triage assessment does not replace that evaluation, and the importance of remaining in the ED until their evaluation is complete.  Fall, hip pain. Xrays at this time   Brynda Peon 10/05/21 2121

## 2021-10-05 NOTE — ED Triage Notes (Signed)
Pt presents via EMS c/o right hip and lower back pain. Reports fell over coffee table while walking. TTP to right hip.

## 2021-10-06 ENCOUNTER — Emergency Department: Payer: Medicare Other

## 2021-10-06 ENCOUNTER — Emergency Department
Admission: EM | Admit: 2021-10-06 | Discharge: 2021-10-06 | Disposition: A | Discharge status: Home / Self Care | Payer: Medicare Other | Attending: Emergency Medicine | Admitting: Emergency Medicine

## 2021-10-06 DIAGNOSIS — K6389 Other specified diseases of intestine: Secondary | ICD-10-CM | POA: Diagnosis not present

## 2021-10-06 DIAGNOSIS — Z9889 Other specified postprocedural states: Secondary | ICD-10-CM | POA: Diagnosis not present

## 2021-10-06 DIAGNOSIS — M545 Low back pain, unspecified: Secondary | ICD-10-CM | POA: Diagnosis not present

## 2021-10-06 DIAGNOSIS — T1490XA Injury, unspecified, initial encounter: Secondary | ICD-10-CM | POA: Diagnosis not present

## 2021-10-06 DIAGNOSIS — S2241XA Multiple fractures of ribs, right side, initial encounter for closed fracture: Secondary | ICD-10-CM

## 2021-10-06 DIAGNOSIS — M47812 Spondylosis without myelopathy or radiculopathy, cervical region: Secondary | ICD-10-CM | POA: Diagnosis not present

## 2021-10-06 LAB — URINALYSIS, ROUTINE W REFLEX MICROSCOPIC
Bilirubin Urine: NEGATIVE
Glucose, UA: NEGATIVE mg/dL
Ketones, ur: NEGATIVE mg/dL
Leukocytes,Ua: NEGATIVE
Nitrite: NEGATIVE
Protein, ur: NEGATIVE mg/dL
Specific Gravity, Urine: 1.005 (ref 1.005–1.030)
Squamous Epithelial / HPF: NONE SEEN (ref 0–5)
pH: 8 (ref 5.0–8.0)

## 2021-10-06 LAB — BASIC METABOLIC PANEL
Anion gap: 9 (ref 5–15)
BUN: 5 mg/dL — ABNORMAL LOW (ref 8–23)
CO2: 23 mmol/L (ref 22–32)
Calcium: 8.6 mg/dL — ABNORMAL LOW (ref 8.9–10.3)
Chloride: 107 mmol/L (ref 98–111)
Creatinine, Ser: 0.56 mg/dL (ref 0.44–1.00)
GFR, Estimated: >60 mL/min (ref >60)
Glucose, Bld: 129 mg/dL — ABNORMAL HIGH (ref 70–99)
Potassium: 3.9 mmol/L (ref 3.5–5.1)
Sodium: 139 mmol/L (ref 135–145)

## 2021-10-06 MED ORDER — LIDOCAINE 5 % EX PTCH: 1.0000 | MEDICATED_PATCH | CUTANEOUS | 0 refills | Status: DC

## 2021-10-06 MED ORDER — IOHEXOL 300 MG/ML  SOLN
100.0000 mL | Freq: Once | INTRAMUSCULAR | Status: AC | PRN
  Administered 2021-10-06: 100 mL via INTRAVENOUS

## 2021-10-06 MED ORDER — ACETAMINOPHEN 500 MG PO TABS
1000.0000 mg | ORAL_TABLET | Freq: Once | ORAL | Status: AC
  Administered 2021-10-06: 1000 mg via ORAL
  Filled 2021-10-06: qty 2

## 2021-10-06 MED ORDER — OXYCODONE HCL 5 MG PO TABS: 5.0000 mg | ORAL_TABLET | Freq: Three times a day (TID) | ORAL | 0 refills | Status: AC | PRN

## 2021-10-06 NOTE — Discharge Instructions (Addendum)
You have 3 rib fractures on the right side which is causing your pain.  Please use the Lidoderm patch and scheduled Tylenol 1 g every 8 hours for pain.  You can take the oxycodone 5 mg as needed for breakthrough pain.  This can make you sleepy and constipated so please make sure to use your walker after you take the medication.  He is use the incentive spirometer to practice taking deep breaths every day.  Her pain is just not controlled at home or you develop shortness of breath please return to the emergency department.

## 2021-10-06 NOTE — ED Notes (Signed)
Discharge instructions reviewed with patient. Patient questions answered and opportunity for education reviewed. Patient voices understanding of discharge instructions with no further questions. Patient wheeled in chair to lobby.

## 2021-10-06 NOTE — ED Notes (Signed)
Pt c/o pain to to rt side back--appears tender on palpation to posterior lower ribcage; xray ordered for evaluation

## 2021-10-06 NOTE — ED Provider Notes (Signed)
[facility] Provider Note    Event Date/Time   First MD Initiated Contact with Patient 10/06/21 [phone]     (approximate)   History   Fall   HPI  Joan Baldwin is a 76 y.o. female with past medical history of hypertension ataxia with frequent falls who presents after a fall.  Patient was getting up from sitting yesterday when she hit her knee on a coffee table which caused her to fall.  Hit her right flank on the ground.  She since has right flank pain.  She denies chest pain or shortness of breath denies anterior abdominal pain.  Denies any other extremity pain.  She did hit her head had a headache initially but denies headache currently.  Denies nausea vomiting.  She is not on blood thinners.  Otherwise she has been well without recent illnesses.  No urinary symptoms.     Past Medical History:  Diagnosis Date   Anxiety    can cause palpitations   Balance problems    had brain scan (10/02/19) / clearance given by Dr Caryn Section   Cataract    Complication of anesthesia    patient state, "they have to give me alot to go out" during colonoscopy   Depression    GERD (gastroesophageal reflux disease)    Glaucoma    Hepatitis    B w treatment/ been cleared (Dr Maralyn Sago records)   History of chicken pox    Hypertension    controlled on meds   Osteoporosis    Sepsis ([place]) 01/17/2016   Wears dentures    upper    Patient Active Problem List   Diagnosis Date Noted   MDD (major depressive disorder), recurrent episode, with atypical features ([place]) 02/10/2021   Major depression, recurrent, chronic ([place]) 02/06/2021   Closed fracture of shaft of ulna 11/17/2020   Chronic hepatitis C without hepatic coma ([place]) 06/01/2019   Hepatitis C virus infection without hepatic coma 09/03/2018   Chronic dental pain 08/30/2018   History of adenomatous polyp of colon 12/29/2016   History of smoking 30 or more pack years 05/26/2016   Glaucoma 11/22/2014   Skin lesion  11/22/2014   Osteoporosis 02/13/2013   Pre-diabetes 08/10/2009   Essential (primary) hypertension 05/06/2006   Anxiety 01/11/2001   Insomnia 01/11/2001   GERD (gastroesophageal reflux disease) 01/11/2001     Physical Exam  Triage Vital Signs: ED Triage Vitals  Enc Vitals Group     BP 10/05/21 2102 (!) 117/44     Pulse Rate 10/05/21 2102 73     Resp 10/05/21 2102 16     Temp 10/05/21 2103 97.8 F (36.6 C)     Temp Source 10/05/21 2103 Oral     SpO2 10/05/21 2102 98 %     Weight 10/05/21 2103 135 lb (61.2 kg)     Height 10/05/21 2103 '5\' 6"'$  (1.676 m)     Head Circumference --      Peak Flow --      Pain Score 10/05/21 2103 10     Pain Loc --      Pain Edu? --      Excl. in [place]? --     Most recent vital signs: Vitals:   10/06/21 0900 10/06/21 0927  BP: (!) 118/96   Pulse: 71   Resp: 18   Temp:  98.2 F (36.8 C)  SpO2: 97%      General: Awake, no distress.  CV:  Good peripheral perfusion.  Resp:  Normal effort.  Abd:  No distention.  Neuro:             Awake, Alert, Oriented x 3  Other:  Small area of ecchymosis over the right eyebrow no other signs of trauma to the head or neck, no C-spine tenderness No midline T or L-spine tenderness There is a small abrasion over the right flank near the CVA region and there is some crepitus and tenderness to palpation in this region patient has significant pain with rotation and movement in the stretcher No anterior chest wall tenderness Abdomen is soft and nontender no ecchymosis Pelvis is stable nontender able to range both hips without difficulty   ED Results / Procedures / Treatments  Labs (all labs ordered are listed, but only abnormal results are displayed) Labs Reviewed  CBC - Abnormal; Notable for the following components:      Result Value   WBC 14.1 (*)    All other components within normal limits  BASIC METABOLIC PANEL - Abnormal; Notable for the following components:   Glucose, Bld 129 (*)    BUN 5 (*)     Calcium 8.6 (*)    All other components within normal limits  URINALYSIS, ROUTINE W REFLEX MICROSCOPIC - Abnormal; Notable for the following components:   Color, Urine STRAW (*)    APPearance CLEAR (*)    Hgb urine dipstick SMALL (*)    Bacteria, UA RARE (*)    All other components within normal limits     EKG     RADIOLOGY I reviewed and interpreted the right rib film which shows posterior displaced rib fractures 10th and 11th   PROCEDURES:  Critical Care performed: No  .1-3 Lead EKG Interpretation  Performed by: Rada Hay, MD Authorized by: Rada Hay, MD     Interpretation: normal     ECG rate assessment: normal     Rhythm: sinus rhythm     Ectopy: none     Conduction: normal     The patient is on the cardiac monitor to evaluate for evidence of arrhythmia and/or significant heart rate changes.   MEDICATIONS ORDERED IN ED: Medications  fentaNYL (SUBLIMAZE) injection 50 mcg (50 mcg Intravenous Given 10/06/21 0623)  acetaminophen (TYLENOL) tablet 1,000 mg (1,000 mg Oral Given 10/06/21 0744)  iohexol (OMNIPAQUE) 300 MG/ML solution 100 mL (100 mLs Intravenous Contrast Given 10/06/21 1009)     IMPRESSION / MDM / ASSESSMENT AND PLAN / ED COURSE  I reviewed the triage vital signs and the nursing notes.                              Patient's presentation is most consistent with acute presentation with potential threat to life or bodily function.  Differential diagnosis includes, but is not limited to, rib fracture, pneumothorax, hemothorax, contusion, renal or hepatic injury  The patient is a 76 year old female presents after mechanical fall with right flank pain.  She fell after standing onto her right flank did hit her head.  Primary complaint is pain to the right rib cage posteriorly.  On exam she does have significant discomfort with movement in the stretcher.  She has a small area of ecchymosis over the right forehead and she has both crepitus and  tenderness to palpation in the right CVA region with a small abrasion.  Her anterior chest wall is nontender abdomen soft nontender pelvis is stable nontender she can range  her extremities.  X-ray of the right ribs was obtained from triage which shows displaced 10th and 11th rib fractures broken in 2 places.  Given her degree of discomfort and the significant rib fracture will obtain CT chest abdomen pelvis to rule out any other intrathoracic or intra-abdominal injury.  Will obtain CT head and C-spine.  Patient received fentanyl prior to my evaluation.  Will give p.o. Tylenol and additional opiates as needed.    Patient's CT of the head and neck are without acute abnormality.  CT chest and pelvis demonstrates ninth 10th and 11th right rib fractures with some displacement is not of the 10th rib fracture.  There is linear atelectasis in the lung bases small right pleural effusion no pneumothorax.  On repeat assessment patient says pain is controlled.  She continues to saturate in the high 90s is not tachypneic and not in respiratory distress and does not complain of shortness of breath.  She would very much like to go home.  Did discuss with her admission given her age and the 3 rib fractures this would be reasonable but she preferred discharge.  Provided with incentive spirometer.  Discharge with Lidoderm patch and oxycodone and recommended scheduled Tylenol.  We did discuss strict return precautions for any shortness of breath or just poor pain control.  Did discuss risk of pneumonia and respiratory issues with splinting.  Patient does have social support at home so I think given she would like to be discharged this is reasonable.   FINAL CLINICAL IMPRESSION(S) / ED DIAGNOSES   Final diagnoses:  Closed fracture of three ribs of right side, initial encounter     Rx / DC Orders   ED Discharge Orders          Ordered    lidocaine (LIDODERM) 5 %  Every 24 hours        10/06/21 1116    oxyCODONE  (ROXICODONE) 5 MG immediate release tablet  Every 8 hours PRN        10/06/21 1116             Note:  This document was prepared using Dragon voice recognition software and may include unintentional dictation errors.   Rada Hay, MD 10/06/21 [phone]

## 2021-10-13 ENCOUNTER — Telehealth: Payer: Self-pay

## 2021-10-13 NOTE — Progress Notes (Signed)
I,Sulibeya S Dimas,acting as a scribe for Lelon Huh, MD.,have documented all relevant documentation on the behalf of Lelon Huh, MD,as directed by  Lelon Huh, MD while in the presence of Lelon Huh, MD.     Established patient visit   Patient: Joan Baldwin   DOB: October 12, 1945   76 y.o. Female  MRN: [id number] Visit Date: 10/14/2021  Today's healthcare provider: Lelon Huh, MD   Chief Complaint  Patient presents with   Follow-up   Subjective    HPI  Follow up ER visit  Patient was seen in ER for fall on 10/06/21. Per ER records "Patient was getting up from sitting yesterday when she hit her knee on a coffee table which caused her to fall.  Hit her right flank on the ground"  She had CT head and neck without acute abnormality.  CT chest and pelvis demonstrated ninth 10th and 11th right rib fractures with some displacement of the 10th rib fracture.  There is linear atelectasis in the lung bases small right pleural effusion no pneumothorax  Treatment for this included  fentaNYL (SUBLIMAZE) injection 50 mcg (50 mcg Intravenous Given 10/06/21 0623)  acetaminophen (TYLENOL) tablet 1,000 mg (1,000 mg Oral Given 10/06/21 0744)  iohexol (OMNIPAQUE) 300 MG/ML solution 100 mL (100 mLs Intravenous Contrast Given 10/06/21 1009)   She was discharged with prescription for lidoderm 5% patch and oxycodone '5mg'$  tablets.   She reports excellent compliance with treatment. She reports this condition is Worse.  -----------------------------------------------------------------------------------------   Medications: Outpatient Medications Prior to Visit  Medication Sig   amLODipine (NORVASC) 5 MG tablet Take 1 tablet (5 mg total) by mouth daily.   Calcium Carbonate-Vitamin D (CALCIUM 600/VITAMIN D PO) Take 1 tablet by mouth daily.    diazepam (VALIUM) 10 MG tablet TAKE 1/2 TO 1 (ONE-HALF TO ONE) TABLET BY MOUTH 4 TIMES DAILY AS NEEDED FOR  ANXIETY   Fiber POWD Take 2 scoop by  mouth daily.   FLUoxetine (PROZAC) 20 MG capsule Take 1 capsule (20 mg total) by mouth daily.   HYDROcodone-acetaminophen (NORCO/VICODIN) 5-325 MG tablet Take 1 tablet by mouth every 6 (six) hours as needed.   latanoprost (XALATAN) 0.005 % ophthalmic solution Place 1 drop into both eyes at bedtime.   lidocaine (LIDODERM) 5 % Place 1 patch onto the skin every 12 (twelve) hours. Remove & Discard patch within 12 hours or as directed by MD   lidocaine (LIDODERM) 5 % Place 1 patch onto the skin daily. Remove & Discard patch within 12 hours or as directed by MD   lidocaine (XYLOCAINE) 2 % solution USE AS DIRECTED 15ML IN MOUTH OR THROAT EVERY 4-6 HOURS AS NEEDED FOR MOUTH PAIN   lisinopril (ZESTRIL) 10 MG tablet Take 1 tablet (10 mg total) by mouth daily.   Multiple Vitamins-Minerals (CENTRUM SILVER) tablet    Omega-3 Fatty Acids (FISH OIL) 1000 MG CAPS Take 1 capsule by mouth daily.   omeprazole (PRILOSEC) 20 MG capsule Take 1 capsule (20 mg total) by mouth daily.   QUEtiapine (SEROQUEL) 50 MG tablet Take 1 tablet (50 mg total) by mouth at bedtime.   timolol (TIMOPTIC) 0.5 % ophthalmic solution timolol maleate 0.5 % eye drops  INSTILL 1 DROP ONCE DAILY INTO EACH EYE   traZODone (DESYREL) 100 MG tablet TAKE 1 TABLET BY MOUTH AT BEDTIME   No facility-administered medications prior to visit.    Review of Systems  Constitutional:  Negative for appetite change, chills, fatigue and fever.  Respiratory:  Negative for chest tightness and shortness of breath.   Cardiovascular:  Negative for chest pain and palpitations.  Gastrointestinal:  Negative for abdominal pain, nausea and vomiting.  Neurological:  Negative for dizziness and weakness.      Last CBC Lab Results  Component Value Date   WBC 14.1 (H) 10/05/2021   HGB 12.9 10/05/2021   HCT 38.7 10/05/2021   MCV 95.1 10/05/2021   MCH 31.7 10/05/2021   RDW 12.9 10/05/2021   PLT 297 74/94/4967   Last metabolic panel Lab Results  Component  Value Date   GLUCOSE 129 (H) 10/06/2021   NA 139 10/06/2021   K 3.9 10/06/2021   CL 107 10/06/2021   CO2 23 10/06/2021   BUN 5 (L) 10/06/2021   CREATININE 0.56 10/06/2021   GFRNONAA >60 10/06/2021   CALCIUM 8.6 (L) 10/06/2021   PHOS 4.6 (H) 06/26/2021   PROT 6.8 06/11/2021   ALBUMIN 4.6 06/26/2021   LABGLOB 2.7 06/01/2019   AGRATIO 1.6 06/01/2019   BILITOT 0.6 06/11/2021   ALKPHOS 38 06/11/2021   AST 20 06/11/2021   ALT 7 06/11/2021   ANIONGAP 9 10/06/2021      Objective    BP 116/60 (BP Location: Right Arm, Patient Position: Sitting, Cuff Size: Large)   Pulse 88   Temp 97.8 F (36.6 C) (Oral)   Resp 16   Wt 123 lb 12.8 oz (56.2 kg)   BMI 19.98 kg/m   Physical Exam  Sitting in wheelchair. Very uncomfortable due to rib pain. No respiratory distress. Chest CTA. CV RRR .     Assessment & Plan     1. Closed fracture of multiple ribs of right side, initial encounter  - ketorolac (TORADOL) injection 60 mg - HYDROmorphone (DILAUDID) 2 MG tablet; Take 1 tablet (2 mg total) by mouth every 6 (six) hours as needed for severe pain.  Dispense: 30 tablet; Refill: 0   2. Anxiety refill diazepam (VALIUM) 10 MG tablet; TAKE 1/2 TO 1 (ONE-HALF TO ONE) TABLET BY MOUTH 4 TIMES DAILY AS NEEDED FOR  ANXIETY  Dispense: 120 tablet; Refill: 0  The entirety of the information documented in the History of Present Illness, Review of Systems and Physical Exam were personally obtained by me. Portions of this information were initially documented by the CMA and reviewed by me for thoroughness and accuracy.        The entirety of the information documented in the History of Present Illness, Review of Systems and Physical Exam were personally obtained by me. Portions of this information were initially documented by the CMA and reviewed by me for thoroughness and accuracy.     Lelon Huh, MD  [facility] [phone] (phone) [phone] (fax)  [place]

## 2021-10-13 NOTE — Telephone Encounter (Signed)
Called Daughter in law to schedule hospital f/u appt with patient. LMTCB. Okay for [facility] to advise.

## 2021-10-13 NOTE — Telephone Encounter (Signed)
Pt has been scheduled 10/04 at 9:20

## 2021-10-13 NOTE — Telephone Encounter (Signed)
Copied from [place] [id number]. Topic: Appointment Scheduling - Scheduling Inquiry for Clinic >> Oct 07, 2021  2:30 PM ZDGLOVFI J wrote: Reason for CRM: pt was seen at the hospital for a fall. Pt was seen at the hospital on 10/06/21 and advised to follow up with PCP within 1 week.   Please assist pt further   Sharyn Lull (daughter in law) [phone]

## 2021-10-14 ENCOUNTER — Encounter: Payer: Self-pay | Admitting: Family Medicine

## 2021-10-14 ENCOUNTER — Ambulatory Visit (INDEPENDENT_AMBULATORY_CARE_PROVIDER_SITE_OTHER): Payer: Medicare Other | Admitting: Family Medicine

## 2021-10-14 VITALS — BP 116/60 | HR 88 | Temp 97.8°F | Resp 16 | Wt 123.8 lb

## 2021-10-14 DIAGNOSIS — F419 Anxiety disorder, unspecified: Secondary | ICD-10-CM

## 2021-10-14 DIAGNOSIS — S2241XA Multiple fractures of ribs, right side, initial encounter for closed fracture: Secondary | ICD-10-CM | POA: Diagnosis not present

## 2021-10-14 MED ORDER — KETOROLAC TROMETHAMINE 60 MG/2ML IM SOLN
60.0000 mg | Freq: Once | INTRAMUSCULAR | Status: AC
  Administered 2021-10-14: 60 mg via INTRAMUSCULAR

## 2021-10-14 MED ORDER — DIAZEPAM 10 MG PO TABS: ORAL_TABLET | ORAL | 0 refills | Status: DC

## 2021-10-14 MED ORDER — HYDROMORPHONE HCL 2 MG PO TABS: 2.0000 mg | ORAL_TABLET | Freq: Four times a day (QID) | ORAL | 0 refills | Status: DC | PRN

## 2021-10-14 MED ORDER — KETOROLAC TROMETHAMINE 60 MG/2ML IM SOLN: 60.0000 mg | Freq: Once | INTRAMUSCULAR | Status: DC

## 2021-10-16 ENCOUNTER — Ambulatory Visit: Payer: Medicare Other | Admitting: Family Medicine

## 2021-10-16 ENCOUNTER — Ambulatory Visit
Admission: RE | Admit: 2021-10-16 | Discharge: 2021-10-16 | Disposition: A | Discharge status: Home / Self Care | Payer: Medicare Other | Source: Ambulatory Visit | Attending: Family Medicine | Admitting: Family Medicine

## 2021-10-16 ENCOUNTER — Telehealth: Payer: Self-pay | Admitting: Family Medicine

## 2021-10-16 ENCOUNTER — Ambulatory Visit
Admission: RE | Admit: 2021-10-16 | Discharge: 2021-10-16 | Disposition: A | Discharge status: Home / Self Care | Payer: Medicare Other | Attending: Family Medicine | Admitting: Family Medicine

## 2021-10-16 DIAGNOSIS — S2241XA Multiple fractures of ribs, right side, initial encounter for closed fracture: Secondary | ICD-10-CM | POA: Insufficient documentation

## 2021-10-16 DIAGNOSIS — J90 Pleural effusion, not elsewhere classified: Secondary | ICD-10-CM | POA: Insufficient documentation

## 2021-10-16 DIAGNOSIS — Y92009 Unspecified place in unspecified non-institutional (private) residence as the place of occurrence of the external cause: Secondary | ICD-10-CM | POA: Insufficient documentation

## 2021-10-16 DIAGNOSIS — W07XXXA Fall from chair, initial encounter: Secondary | ICD-10-CM | POA: Diagnosis not present

## 2021-10-16 NOTE — Telephone Encounter (Signed)
Patient reports medication is helping with pain. She will try to find someone to drive her for her x-ray.

## 2021-10-16 NOTE — Telephone Encounter (Signed)
Patient was seen Wednesday for rib fractures, please check to see how the pain medications are doing.  Also need to get follow x-rays either today or Monday, have placed order already, she just needs to go to [facility] at her convenience.

## 2021-11-16 ENCOUNTER — Ambulatory Visit (INDEPENDENT_AMBULATORY_CARE_PROVIDER_SITE_OTHER): Payer: Medicare Other | Admitting: Family Medicine

## 2021-11-16 ENCOUNTER — Encounter: Payer: Self-pay | Admitting: Family Medicine

## 2021-11-16 VITALS — BP 134/62 | Temp 98.5°F | Resp 14 | Wt 128.0 lb

## 2021-11-16 DIAGNOSIS — Z23 Encounter for immunization: Secondary | ICD-10-CM

## 2021-11-16 DIAGNOSIS — I10 Essential (primary) hypertension: Secondary | ICD-10-CM | POA: Diagnosis not present

## 2021-11-16 DIAGNOSIS — S2241XA Multiple fractures of ribs, right side, initial encounter for closed fracture: Secondary | ICD-10-CM | POA: Diagnosis not present

## 2021-11-16 NOTE — Patient Instructions (Signed)
.   Please review the attached list of medications and notify my office if there are any errors.   . Please bring all of your medications to every appointment so we can make sure that our medication list is the same as yours.   

## 2021-11-16 NOTE — Progress Notes (Signed)
I,Roshena L Chambers,acting as a scribe for Lelon Huh, MD.,have documented all relevant documentation on the behalf of Lelon Huh, MD,as directed by  Lelon Huh, MD while in the presence of Lelon Huh, MD.    Established patient visit   Patient: Joan Baldwin   DOB: August 20, 1945   76 y.o. Female  MRN: [id number] Visit Date: 11/16/2021  Today's healthcare provider: Lelon Huh, MD   Chief Complaint  Patient presents with   Follow-up   Hypertension   Subjective    HPI  Follow up for Closed fracture of multiple ribs of right side:  The patient was last seen for this 1 months ago. Changes made at last visit include; given ketorolac injection 60 mg and rx for Hydromorphone 2 mg. Took last hydromorphone about a week ago and has since only had to take OTC Tylenol.   She reports good compliance with treatment. She feels that condition is Improved. She is not having side effects.   ----------------------------------------------------------------------------------------- Hypertension, follow-up  BP Readings from Last 3 Encounters:  11/16/21 134/62  10/14/21 116/60  10/06/21 (!) 157/71   Wt Readings from Last 3 Encounters:  11/16/21 128 lb (58.1 kg)  10/14/21 123 lb 12.8 oz (56.2 kg)  10/05/21 135 lb (61.2 kg)     She was last seen for hypertension on 08/31/2021.   BP at that visit was 109/57. Management since that visit includes reducing amlodipine to '5mg'$  daily.  She reports good compliance with treatment. She is not having side effects.  She is following a Regular diet. She is not exercising. She does smoke.  Use of agents associated with hypertension: none.   Outside blood pressures are not checked. Symptoms: No chest pain No chest pressure  No palpitations No syncope  No dyspnea No orthopnea  No paroxysmal nocturnal dyspnea No lower extremity edema   Pertinent labs Lab Results  Component Value Date   CHOL 186 02/10/2021   HDL 35 (L)  02/10/2021   LDLCALC 129 (H) 02/10/2021   TRIG 112 02/10/2021   CHOLHDL 5.3 02/10/2021   Lab Results  Component Value Date   NA 139 10/06/2021   K 3.9 10/06/2021   CREATININE 0.56 10/06/2021   GFRNONAA >60 10/06/2021   GLUCOSE 129 (H) 10/06/2021   TSH 1.100 06/26/2021     The 10-year ASCVD risk score (Arnett DK, et al., 2019) is: 37.6%  ---------------------------------------------------------------------------------------------------    Medications: Outpatient Medications Prior to Visit  Medication Sig   amLODipine (NORVASC) 5 MG tablet Take 1 tablet (5 mg total) by mouth daily.   Calcium Carbonate-Vitamin D (CALCIUM 600/VITAMIN D PO) Take 1 tablet by mouth daily.    diazepam (VALIUM) 10 MG tablet TAKE 1/2 TO 1 (ONE-HALF TO ONE) TABLET BY MOUTH 4 TIMES DAILY AS NEEDED FOR  ANXIETY   Fiber POWD Take 2 scoop by mouth daily.   FLUoxetine (PROZAC) 20 MG capsule Take 1 capsule (20 mg total) by mouth daily.   HYDROmorphone (DILAUDID) 2 MG tablet Take 1 tablet (2 mg total) by mouth every 6 (six) hours as needed for severe pain.   latanoprost (XALATAN) 0.005 % ophthalmic solution Place 1 drop into both eyes at bedtime.   lidocaine (LIDODERM) 5 % Place 1 patch onto the skin every 12 (twelve) hours. Remove & Discard patch within 12 hours or as directed by MD   lidocaine (LIDODERM) 5 % Place 1 patch onto the skin daily. Remove & Discard patch within 12 hours or as directed by  MD   lidocaine (XYLOCAINE) 2 % solution USE AS DIRECTED 15ML IN MOUTH OR THROAT EVERY 4-6 HOURS AS NEEDED FOR MOUTH PAIN   lisinopril (ZESTRIL) 10 MG tablet Take 1 tablet (10 mg total) by mouth daily.   Multiple Vitamins-Minerals (CENTRUM SILVER) tablet    Omega-3 Fatty Acids (FISH OIL) 1000 MG CAPS Take 1 capsule by mouth daily.   omeprazole (PRILOSEC) 20 MG capsule Take 1 capsule (20 mg total) by mouth daily.   QUEtiapine (SEROQUEL) 50 MG tablet Take 1 tablet (50 mg total) by mouth at bedtime.   timolol (TIMOPTIC)  0.5 % ophthalmic solution    traZODone (DESYREL) 100 MG tablet TAKE 1 TABLET BY MOUTH AT BEDTIME   No facility-administered medications prior to visit.    Review of Systems  Constitutional:  Negative for appetite change, chills, fatigue and fever.  Respiratory:  Negative for chest tightness and shortness of breath.   Cardiovascular:  Negative for chest pain and palpitations.  Gastrointestinal:  Negative for abdominal pain, nausea and vomiting.  Neurological:  Negative for dizziness and weakness.       Objective    BP 134/62 (BP Location: Right Arm, Patient Position: Sitting, Cuff Size: Normal)   Temp 98.5 F (36.9 C) (Oral)   Resp 14   Wt 128 lb (58.1 kg)   SpO2 97% Comment: room air  BMI 20.66 kg/m    Physical Exam    General: Appearance:    Well developed, well nourished female in no acute distress  Eyes:    PERRL, conjunctiva/corneas clear, EOM's intact       Lungs:     Clear to auscultation bilaterally, respirations unlabored  Heart:    Normal heart rate. Normal rhythm. No murmurs, rubs, or gallops.    MS:   All extremities are intact.    Neurologic:   Awake, alert, oriented x 3. No apparent focal neurological defect.         Assessment & Plan     1. Closed fracture of multiple ribs of right side, initial encounter Much improved since last visit with normal healing demonstrated on last rib xray 1 month ago. No additional follow up needed.   2. Essential (primary) hypertension Well controlled.  Continue current medications.    Future Appointments  Date Time Provider [place]  03/19/2022  2:00 PM Birdie Sons, MD [facility]  06/21/2022  1:00 PM [facility]-NURSE HEALTH ADVISOR [facility]    3. Need for immunization against influenza  - Flu Vaccine QUAD High Dose(Fluad)      The entirety of the information documented in the History of Present Illness, Review of Systems and Physical Exam were personally obtained by me. Portions of this information were  initially documented by the CMA and reviewed by me for thoroughness and accuracy.     Lelon Huh, MD  [facility] [phone] (phone) [phone] (fax)  [place]

## 2021-11-18 ENCOUNTER — Other Ambulatory Visit: Payer: Self-pay | Admitting: Family Medicine

## 2021-11-18 DIAGNOSIS — F419 Anxiety disorder, unspecified: Secondary | ICD-10-CM

## 2021-11-18 NOTE — Telephone Encounter (Signed)
Requested medication (s) are due for refill today: yes  Requested medication (s) are on the active medication list: yes  Last refill:  10/14/21 #120 with 0 RF  Future visit scheduled: 03/18/21, seen 11/16/21  Notes to clinic:  This medication can not be delegated, please assess.        Requested Prescriptions  Pending Prescriptions Disp Refills   diazepam (VALIUM) 10 MG tablet [Pharmacy Med Name: diazePAM 10 MG Oral Tablet] 120 tablet 0    Sig: TAKE 1/2 TO 1 (ONE-HALF TO ONE) TABLET BY MOUTH 4 TIMES DAILY AS NEEDED FOR  ANXIETY     Not Delegated - Psychiatry: Anxiolytics/Hypnotics 2 Failed - 11/18/2021 11:03 AM      Failed - This refill cannot be delegated      Passed - Urine Drug Screen completed in last 360 days      Passed - Patient is not pregnant      Passed - Valid encounter within last 6 months    Recent Outpatient Visits           2 days ago Closed fracture of multiple ribs of right side, initial encounter   [facility] Birdie Sons, MD   1 month ago Closed fracture of multiple ribs of right side, initial encounter   [facility] Birdie Sons, MD   2 months ago Essential (primary) hypertension   [facility] Birdie Sons, MD   4 months ago Hyponatremia   [facility] Birdie Sons, MD   7 months ago Capron, Donald E, MD       Future Appointments             In 4 months Fisher, Kirstie Peri, MD [facility], [place]

## 2021-11-26 ENCOUNTER — Other Ambulatory Visit: Payer: Self-pay | Admitting: Family Medicine

## 2021-11-26 DIAGNOSIS — G4700 Insomnia, unspecified: Secondary | ICD-10-CM

## 2021-11-26 NOTE — Telephone Encounter (Signed)
Requested Prescriptions  Pending Prescriptions Disp Refills   traZODone (DESYREL) 100 MG tablet [Pharmacy Med Name: traZODone HCl 100 MG Oral Tablet] 90 tablet 0    Sig: TAKE 1 TABLET BY MOUTH AT BEDTIME     Psychiatry: Antidepressants - Serotonin Modulator Passed - 11/26/2021 10:44 AM      Passed - Completed PHQ-2 or PHQ-9 in the last 360 days      Passed - Valid encounter within last 6 months    Recent Outpatient Visits           1 week ago Closed fracture of multiple ribs of right side, initial encounter   [place], Kirstie Peri, MD   1 month ago Closed fracture of multiple ribs of right side, initial encounter   [facility] Birdie Sons, MD   2 months ago Essential (primary) hypertension   [facility] Birdie Sons, MD   5 months ago Hyponatremia   [facility] Birdie Sons, MD   7 months ago [place], Donald E, MD       Future Appointments             In 3 months [place], Kirstie Peri, MD [facility]             QUEtiapine (SEROQUEL) 50 MG tablet [Pharmacy Med Name: QUEtiapine Fumarate 50 MG Oral Tablet] 30 tablet 0    Sig: TAKE 1 TABLET BY MOUTH AT BEDTIME     Not Delegated - Psychiatry:  Antipsychotics - Second Generation (Atypical) - quetiapine Failed - 11/26/2021 10:44 AM      Failed - This refill cannot be delegated      Failed - Lipid Panel in normal range within the last 12 months    Cholesterol, Total  Date Value Ref Range Status  06/01/2019 185 100 - 199 mg/dL Final   Cholesterol  Date Value Ref Range Status  02/10/2021 186 0 - 200 mg/dL Final   LDL Chol Calc (NIH)  Date Value Ref Range Status  06/01/2019 127 (H) 0 - 99 mg/dL Final   LDL Cholesterol  Date Value Ref Range Status  02/10/2021 129 (H) 0 - 99 mg/dL Final    Comment:           Total Cholesterol/HDL:CHD Risk Coronary Heart Disease Risk Table                      Men   Women  1/2 Average Risk   3.4   3.3  Average Risk       5.0   4.4  2 X Average Risk   9.6   7.1  3 X Average Risk  23.4   11.0        Use the calculated Patient Ratio above and the CHD Risk Table to determine the patient's CHD Risk.        ATP III CLASSIFICATION (LDL):  <100     mg/dL   Optimal  100-129  mg/dL   Near or Above                    Optimal  130-159  mg/dL   Borderline  160-189  mg/dL   High  >190     mg/dL   Very High Performed at [facility], [address] [place], [place] [address]    HDL  Date Value Ref Range Status  02/10/2021  35 (L) >40 mg/dL Final  06/01/2019 44 >39 mg/dL Final   Triglycerides  Date Value Ref Range Status  02/10/2021 112 <150 mg/dL Final         Failed - CBC within normal limits and completed in the last 12 months    WBC  Date Value Ref Range Status  10/05/2021 14.1 (H) 4.0 - 10.5 K/uL Final   RBC  Date Value Ref Range Status  10/05/2021 4.07 3.87 - 5.11 MIL/uL Final   Hemoglobin  Date Value Ref Range Status  10/05/2021 12.9 12.0 - 15.0 g/dL Final   HCT  Date Value Ref Range Status  10/05/2021 38.7 36.0 - 46.0 % Final   MCHC  Date Value Ref Range Status  10/05/2021 33.3 30.0 - 36.0 g/dL Final   [facility]  Date Value Ref Range Status  10/05/2021 31.7 26.0 - 34.0 pg Final   MCV  Date Value Ref Range Status  10/05/2021 95.1 80.0 - 100.0 fL Final   No results found for: "PLTCOUNTKUC", "LABPLAT", "POCPLA" RDW  Date Value Ref Range Status  10/05/2021 12.9 11.5 - 15.5 % Final         Passed - TSH in normal range and within 360 days    TSH  Date Value Ref Range Status  06/26/2021 1.100 0.450 - 4.500 uIU/mL Final         Passed - Completed PHQ-2 or PHQ-9 in the last 360 days      Passed - Last BP in normal range    BP Readings from Last 1 Encounters:  11/16/21 134/62         Passed - Last Heart Rate in normal range    Pulse Readings from Last 1 Encounters:  10/14/21 88         Passed -  Valid encounter within last 6 months    Recent Outpatient Visits           1 week ago Closed fracture of multiple ribs of right side, initial encounter   [facility] Birdie Sons, MD   1 month ago Closed fracture of multiple ribs of right side, initial encounter   [facility] Birdie Sons, MD   2 months ago Essential (primary) hypertension   [facility] Birdie Sons, MD   5 months ago Hyponatremia   [facility] Birdie Sons, MD   7 months ago [place], Donald E, MD       Future Appointments             In 3 months Fisher, Kirstie Peri, MD [facility], [place] within normal limits and completed in the last 12 months    Albumin  Date Value Ref Range Status  06/26/2021 4.6 3.7 - 4.7 g/dL Final   Alkaline Phosphatase  Date Value Ref Range Status  06/11/2021 38 38 - 126 U/L Final   ALT  Date Value Ref Range Status  06/11/2021 7 0 - 44 U/L Final   ALT (SGPT) P5P  Date Value Ref Range Status  10/16/2018 12 0 - 40 IU/L Final   AST  Date Value Ref Range Status  06/11/2021 20 15 - 41 U/L Final   BUN  Date Value Ref Range Status  10/06/2021 5 (L) 8 - 23 mg/dL Final  06/26/2021 14 8 - 27 mg/dL Final   Calcium  Date  Value Ref Range Status  10/06/2021 8.6 (L) 8.9 - 10.3 mg/dL Final   CO2  Date Value Ref Range Status  10/06/2021 23 22 - 32 mmol/L Final   Creatinine, Ser  Date Value Ref Range Status  10/06/2021 0.56 0.44 - 1.00 mg/dL Final   Glucose  Date Value Ref Range Status  08/21/2013 133 mg/dL Final   Glucose, Bld  Date Value Ref Range Status  10/06/2021 129 (H) 70 - 99 mg/dL Final    Comment:    Glucose reference range applies only to samples taken after fasting for at least 8 hours.   Potassium  Date Value Ref Range Status  10/06/2021 3.9 3.5 - 5.1 mmol/L Final   Sodium  Date Value Ref Range Status   10/06/2021 139 135 - 145 mmol/L Final  06/26/2021 137 134 - 144 mmol/L Final   Total Bilirubin  Date Value Ref Range Status  06/11/2021 0.6 0.3 - 1.2 mg/dL Final   Bilirubin Total  Date Value Ref Range Status  06/01/2019 <0.2 0.0 - 1.2 mg/dL Final   Bilirubin, Total  Date Value Ref Range Status  10/16/2018 0.2 0.0 - 1.2 mg/dL Final   Protein, ur  Date Value Ref Range Status  10/06/2021 NEGATIVE NEGATIVE mg/dL Final   Total Protein  Date Value Ref Range Status  06/11/2021 6.8 6.5 - 8.1 g/dL Final  06/01/2019 7.1 6.0 - 8.5 g/dL Final   GFR calc Af Amer  Date Value Ref Range Status  10/01/2019 79 >59 mL/min/1.73 Final    Comment:    **[facility] currently reports eGFR in compliance with the current**   recommendations of the [employer]. [facility] will   update reporting as new guidelines are published from the NKF-ASN   Task force.    eGFR  Date Value Ref Range Status  06/26/2021 65 >59 mL/min/1.73 Final   GFR, Estimated  Date Value Ref Range Status  10/06/2021 >60 >60 mL/min Final    Comment:    (NOTE) Calculated using the CKD-EPI Creatinine Equation (2021)

## 2021-11-26 NOTE — Telephone Encounter (Signed)
Requested medication (s) are due for refill today:Due 12/02/21  Requested medication (s) are on the active medication list: yes    Last refill: 09/01/21 #30  2 refills  Future visit scheduled yes 03/19/22  Notes to clinic:Not delegated, please review. Thank you.  Requested Prescriptions  Pending Prescriptions Disp Refills   QUEtiapine (SEROQUEL) 50 MG tablet [Pharmacy Med Name: QUEtiapine Fumarate 50 MG Oral Tablet] 30 tablet 0    Sig: TAKE 1 TABLET BY MOUTH AT BEDTIME     Not Delegated - Psychiatry:  Antipsychotics - Second Generation (Atypical) - quetiapine Failed - 11/26/2021 10:44 AM      Failed - This refill cannot be delegated      Failed - Lipid Panel in normal range within the last 12 months    Cholesterol, Total  Date Value Ref Range Status  06/01/2019 185 100 - 199 mg/dL Final   Cholesterol  Date Value Ref Range Status  02/10/2021 186 0 - 200 mg/dL Final   LDL Chol Calc (NIH)  Date Value Ref Range Status  06/01/2019 127 (H) 0 - 99 mg/dL Final   LDL Cholesterol  Date Value Ref Range Status  02/10/2021 129 (H) 0 - 99 mg/dL Final    Comment:           Total Cholesterol/HDL:CHD Risk Coronary Heart Disease Risk Table                     Men   Women  1/2 Average Risk   3.4   3.3  Average Risk       5.0   4.4  2 X Average Risk   9.6   7.1  3 X Average Risk  23.4   11.0        Use the calculated Patient Ratio above and the CHD Risk Table to determine the patient's CHD Risk.        ATP III CLASSIFICATION (LDL):  <100     mg/dL   Optimal  100-129  mg/dL   Near or Above                    Optimal  130-159  mg/dL   Borderline  160-189  mg/dL   High  >190     mg/dL   Very High Performed at [facility], [place], [place] [address]    HDL  Date Value Ref Range Status  02/10/2021 35 (L) >40 mg/dL Final  06/01/2019 44 >39 mg/dL Final   Triglycerides  Date Value Ref Range Status  02/10/2021 112 <150 mg/dL Final         Failed - CBC  within normal limits and completed in the last 12 months    WBC  Date Value Ref Range Status  10/05/2021 14.1 (H) 4.0 - 10.5 K/uL Final   RBC  Date Value Ref Range Status  10/05/2021 4.07 3.87 - 5.11 MIL/uL Final   Hemoglobin  Date Value Ref Range Status  10/05/2021 12.9 12.0 - 15.0 g/dL Final   HCT  Date Value Ref Range Status  10/05/2021 38.7 36.0 - 46.0 % Final   MCHC  Date Value Ref Range Status  10/05/2021 33.3 30.0 - 36.0 g/dL Final   [facility]  Date Value Ref Range Status  10/05/2021 31.7 26.0 - 34.0 pg Final   MCV  Date Value Ref Range Status  10/05/2021 95.1 80.0 - 100.0 fL Final   No results found for: "PLTCOUNTKUC", "LABPLAT", "POCPLA"  RDW  Date Value Ref Range Status  10/05/2021 12.9 11.5 - 15.5 % Final         Passed - TSH in normal range and within 360 days    TSH  Date Value Ref Range Status  06/26/2021 1.100 0.450 - 4.500 uIU/mL Final         Passed - Completed PHQ-2 or PHQ-9 in the last 360 days      Passed - Last BP in normal range    BP Readings from Last 1 Encounters:  11/16/21 134/62         Passed - Last Heart Rate in normal range    Pulse Readings from Last 1 Encounters:  10/14/21 88         Passed - Valid encounter within last 6 months    Recent Outpatient Visits           1 week ago Closed fracture of multiple ribs of right side, initial encounter   [facility] Birdie Sons, MD   1 month ago Closed fracture of multiple ribs of right side, initial encounter   [facility] Birdie Sons, MD   2 months ago Essential (primary) hypertension   [facility] Birdie Sons, MD   5 months ago Hyponatremia   [facility] Birdie Sons, MD   7 months ago [place], Donald E, MD       Future Appointments             In 3 months Fisher, Kirstie Peri, MD [facility], [place] within normal limits and  completed in the last 12 months    Albumin  Date Value Ref Range Status  06/26/2021 4.6 3.7 - 4.7 g/dL Final   Alkaline Phosphatase  Date Value Ref Range Status  06/11/2021 38 38 - 126 U/L Final   ALT  Date Value Ref Range Status  06/11/2021 7 0 - 44 U/L Final   ALT (SGPT) P5P  Date Value Ref Range Status  10/16/2018 12 0 - 40 IU/L Final   AST  Date Value Ref Range Status  06/11/2021 20 15 - 41 U/L Final   BUN  Date Value Ref Range Status  10/06/2021 5 (L) 8 - 23 mg/dL Final  06/26/2021 14 8 - 27 mg/dL Final   Calcium  Date Value Ref Range Status  10/06/2021 8.6 (L) 8.9 - 10.3 mg/dL Final   CO2  Date Value Ref Range Status  10/06/2021 23 22 - 32 mmol/L Final   Creatinine, Ser  Date Value Ref Range Status  10/06/2021 0.56 0.44 - 1.00 mg/dL Final   Glucose  Date Value Ref Range Status  08/21/2013 133 mg/dL Final   Glucose, Bld  Date Value Ref Range Status  10/06/2021 129 (H) 70 - 99 mg/dL Final    Comment:    Glucose reference range applies only to samples taken after fasting for at least 8 hours.   Potassium  Date Value Ref Range Status  10/06/2021 3.9 3.5 - 5.1 mmol/L Final   Sodium  Date Value Ref Range Status  10/06/2021 139 135 - 145 mmol/L Final  06/26/2021 137 134 - 144 mmol/L Final   Total Bilirubin  Date Value Ref Range Status  06/11/2021 0.6 0.3 - 1.2 mg/dL Final   Bilirubin Total  Date Value Ref Range Status  06/01/2019 <0.2 0.0 -  1.2 mg/dL Final   Bilirubin, Total  Date Value Ref Range Status  10/16/2018 0.2 0.0 - 1.2 mg/dL Final   Protein, ur  Date Value Ref Range Status  10/06/2021 NEGATIVE NEGATIVE mg/dL Final   Total Protein  Date Value Ref Range Status  06/11/2021 6.8 6.5 - 8.1 g/dL Final  06/01/2019 7.1 6.0 - 8.5 g/dL Final   GFR calc Af Amer  Date Value Ref Range Status  10/01/2019 79 >59 mL/min/1.73 Final    Comment:    **[facility] currently reports eGFR in compliance with the current**   recommendations of the  [employer]. [facility] will   update reporting as new guidelines are published from the NKF-ASN   Task force.    eGFR  Date Value Ref Range Status  06/26/2021 65 >59 mL/min/1.73 Final   GFR, Estimated  Date Value Ref Range Status  10/06/2021 >60 >60 mL/min Final    Comment:    (NOTE) Calculated using the CKD-EPI Creatinine Equation (2021)          Signed Prescriptions Disp Refills   traZODone (DESYREL) 100 MG tablet 90 tablet 0    Sig: TAKE 1 TABLET BY MOUTH AT BEDTIME     Psychiatry: Antidepressants - Serotonin Modulator Passed - 11/26/2021 10:44 AM      Passed - Completed PHQ-2 or PHQ-9 in the last 360 days      Passed - Valid encounter within last 6 months    Recent Outpatient Visits           1 week ago Closed fracture of multiple ribs of right side, initial encounter   [facility] Birdie Sons, MD   1 month ago Closed fracture of multiple ribs of right side, initial encounter   [facility] Birdie Sons, MD   2 months ago Essential (primary) hypertension   [facility] Birdie Sons, MD   5 months ago Hyponatremia   [facility] Birdie Sons, MD   7 months ago [place], Donald E, MD       Future Appointments             In 3 months Fisher, Kirstie Peri, MD [facility], [place]

## 2022-01-02 ENCOUNTER — Other Ambulatory Visit: Payer: Self-pay | Admitting: Family Medicine

## 2022-01-09 ENCOUNTER — Other Ambulatory Visit: Payer: Self-pay | Admitting: Family Medicine

## 2022-01-09 DIAGNOSIS — F419 Anxiety disorder, unspecified: Secondary | ICD-10-CM

## 2022-01-23 ENCOUNTER — Other Ambulatory Visit: Payer: Self-pay | Admitting: Family Medicine

## 2022-01-25 NOTE — Telephone Encounter (Signed)
Requested Prescriptions  Pending Prescriptions Disp Refills   omeprazole (PRILOSEC) 20 MG capsule [Pharmacy Med Name: Omeprazole 20 MG Oral Capsule Delayed Release] 90 capsule 0    Sig: Take 1 capsule by mouth once daily     Gastroenterology: Proton Pump Inhibitors Passed - 01/23/2022  1:15 PM      Passed - Valid encounter within last 12 months    Recent Outpatient Visits           2 months ago Closed fracture of multiple ribs of right side, initial encounter   [facility] Birdie Sons, MD   3 months ago Closed fracture of multiple ribs of right side, initial encounter   [facility] Birdie Sons, MD   4 months ago Essential (primary) hypertension   [facility] Birdie Sons, MD   7 months ago Hyponatremia   [facility] Birdie Sons, MD   9 months ago [place], Donald E, MD       Future Appointments             In 1 month Fisher, Kirstie Peri, MD [facility], [place]

## 2022-02-14 ENCOUNTER — Emergency Department (HOSPITAL_COMMUNITY): Payer: Medicare Other

## 2022-02-14 ENCOUNTER — Other Ambulatory Visit: Payer: Self-pay

## 2022-02-14 ENCOUNTER — Encounter (HOSPITAL_COMMUNITY): Payer: Self-pay

## 2022-02-14 ENCOUNTER — Emergency Department (HOSPITAL_COMMUNITY)
Admission: EM | Admit: 2022-02-14 | Discharge: 2022-02-15 | Disposition: A | Discharge status: Home / Self Care | Payer: Medicare Other | Attending: Student | Admitting: Student

## 2022-02-14 DIAGNOSIS — I10 Essential (primary) hypertension: Secondary | ICD-10-CM | POA: Diagnosis not present

## 2022-02-14 DIAGNOSIS — N281 Cyst of kidney, acquired: Secondary | ICD-10-CM | POA: Diagnosis not present

## 2022-02-14 DIAGNOSIS — Z79899 Other long term (current) drug therapy: Secondary | ICD-10-CM | POA: Insufficient documentation

## 2022-02-14 DIAGNOSIS — R109 Unspecified abdominal pain: Secondary | ICD-10-CM | POA: Diagnosis not present

## 2022-02-14 DIAGNOSIS — F17210 Nicotine dependence, cigarettes, uncomplicated: Secondary | ICD-10-CM | POA: Insufficient documentation

## 2022-02-14 DIAGNOSIS — K5641 Fecal impaction: Secondary | ICD-10-CM | POA: Diagnosis not present

## 2022-02-14 DIAGNOSIS — K219 Gastro-esophageal reflux disease without esophagitis: Secondary | ICD-10-CM | POA: Diagnosis not present

## 2022-02-14 DIAGNOSIS — K5900 Constipation, unspecified: Secondary | ICD-10-CM

## 2022-02-14 MED ORDER — MIDAZOLAM HCL 5 MG/5ML IJ SOLN
2.0000 mg | Freq: Once | INTRAMUSCULAR | Status: AC
  Administered 2022-02-14: 2 mg via INTRAVENOUS

## 2022-02-14 MED ORDER — LACTULOSE 10 GM/15ML PO SOLN: 30.0000 g | Freq: Three times a day (TID) | ORAL | 0 refills | Status: DC

## 2022-02-14 MED ORDER — MIDAZOLAM HCL 5 MG/5ML IJ SOLN
0.0500 mg/kg | Freq: Once | INTRAMUSCULAR | Status: AC
  Administered 2022-02-14: 3 mg via INTRAVENOUS
  Filled 2022-02-14: qty 5

## 2022-02-14 NOTE — ED Notes (Signed)
Patient transported to CT 

## 2022-02-14 NOTE — ED Triage Notes (Signed)
Pt reports no BM x 4 days, hx of fecal impaction and feels like that is the current situation.

## 2022-02-14 NOTE — ED Provider Notes (Signed)
[facility] Provider Note  CSN: [id number] Arrival date & time: 02/14/22 1914  Chief Complaint(s) Fecal Impaction  HPI Joan Baldwin is a 77 y.o. female with PMH anxiety, constipation who presents emergency department for evaluation of constipation and concern for fecal impaction.  Patient states that she has not had a bowel movement last 4 days and arrives with significant abdominal pain and distention.  She states that she has required manual disimpaction before and is worried that this may be required today.  Denies nausea, vomiting, headache, fever, chest pain or other systemic symptoms.   Past Medical History Past Medical History:  Diagnosis Date   Anxiety    can cause palpitations   Balance problems    had brain scan (10/02/19) / clearance given by Dr Caryn Section   Cataract    Complication of anesthesia    patient state, "they have to give me alot to go out" during colonoscopy   Depression    GERD (gastroesophageal reflux disease)    Glaucoma    Hepatitis    B w treatment/ been cleared (Dr Maralyn Sago records)   History of chicken pox    Hypertension    controlled on meds   Osteoporosis    Sepsis ([place]) 01/17/2016   Wears dentures    upper   Patient Active Problem List   Diagnosis Date Noted   MDD (major depressive disorder), recurrent episode, with atypical features ([place]) 02/10/2021   Major depression, recurrent, chronic ([place]) 02/06/2021   Closed fracture of shaft of ulna 11/17/2020   Chronic hepatitis C without hepatic coma ([place]) 06/01/2019   Hepatitis C virus infection without hepatic coma 09/03/2018   Chronic dental pain 08/30/2018   History of adenomatous polyp of colon 12/29/2016   History of smoking 30 or more pack years 05/26/2016   Glaucoma 11/22/2014   Skin lesion 11/22/2014   Osteoporosis 02/13/2013   Pre-diabetes 08/10/2009   Essential (primary) hypertension 05/06/2006   Anxiety 01/11/2001   Insomnia 01/11/2001    GERD (gastroesophageal reflux disease) 01/11/2001   Home Medication(s) Prior to Admission medications   Medication Sig Start Date End Date Taking? Authorizing Provider  amLODipine (NORVASC) 5 MG tablet Take 1 tablet (5 mg total) by mouth daily. 08/31/21   Birdie Sons, MD  Calcium Carbonate-Vitamin D (CALCIUM 600/VITAMIN D PO) Take 1 tablet by mouth daily.     [provider]  diazepam (VALIUM) 10 MG tablet TAKE 1/2 TO 1 (ONE-HALF TO ONE) TABLET BY MOUTH 4 TIMES DAILY AS NEEDED FOR ANXIETY 01/10/22   Birdie Sons, MD  Fiber POWD Take 2 scoop by mouth daily.    [provider]  FLUoxetine (PROZAC) 20 MG capsule TAKE 1 CAPSULE BY MOUTH ONCE DAILY TAKE  ALONG  WITH  40  MG  CAPSULE  DAILY 01/06/22   Birdie Sons, MD  latanoprost (XALATAN) 0.005 % ophthalmic solution Place 1 drop into both eyes at bedtime.    [provider]  lidocaine (LIDODERM) 5 % Place 1 patch onto the skin daily. Remove & Discard patch within 12 hours or as directed by MD 10/06/21   Rada Hay, MD  lidocaine (XYLOCAINE) 2 % solution USE AS DIRECTED 15ML IN MOUTH OR THROAT EVERY 4-6 HOURS AS NEEDED FOR MOUTH PAIN 12/09/20   Birdie Sons, MD  lisinopril (ZESTRIL) 10 MG tablet Take 1 tablet (10 mg total) by mouth daily. 07/02/21   Birdie Sons, MD  Multiple Vitamins-Minerals (  CENTRUM SILVER) tablet  12/08/20   [provider]  Omega-3 Fatty Acids (FISH OIL) 1000 MG CAPS Take 1 capsule by mouth daily. 08/04/09   [provider]  omeprazole (PRILOSEC) 20 MG capsule Take 1 capsule by mouth once daily 01/25/22   Birdie Sons, MD  QUEtiapine (SEROQUEL) 50 MG tablet TAKE 1 TABLET BY MOUTH AT BEDTIME 11/26/21   Birdie Sons, MD  timolol (TIMOPTIC) 0.5 % ophthalmic solution     [provider]  traZODone (DESYREL) 100 MG tablet TAKE 1 TABLET BY MOUTH AT BEDTIME 11/26/21   Birdie Sons, MD                                                                                                                                     Past Surgical History Past Surgical History:  Procedure Laterality Date   ABDOMINAL HYSTERECTOMY     vaginal. Still has both ovaries   back surgeries     x 2   CATARACT EXTRACTION W/PHACO Left 10/09/2019   Procedure: CATARACT EXTRACTION PHACO AND INTRAOCULAR LENS PLACEMENT ([place]) LEFT;  Surgeon: Birder Robson, MD;  Location: [facility];  Service: Ophthalmology;  Laterality: Left;  6.16 0:46.0   CATARACT EXTRACTION W/PHACO Right 10/30/2019   Procedure: CATARACT EXTRACTION PHACO AND INTRAOCULAR LENS PLACEMENT ([facility]) RIGHT 6.31 00:46.1;  Surgeon: Birder Robson, MD;  Location: [facility];  Service: Ophthalmology;  Laterality: Right;   COLONOSCOPY  2007   done in [place] per patient. Normal except for moderate hemorrhoids   MOUTH SURGERY  2011   STOMACH SURGERY     stapled for obesity/later reversed   Tailbone surgery     removal of coccyx   Family History Family History  Problem Relation Age of Onset   Emphysema Mother    Kidney failure Sister    Heart Problems Brother    Diabetes Brother    Skin cancer Brother    Breast cancer Neg Hx     Social History Social History   Tobacco Use   Smoking status: Some Days    Packs/day: 1.00    Years: 30.00    Total pack years: 30.00    Types: Cigarettes    Last attempt to quit: 01/11/2001    Years since quitting: 21.1    Passive exposure: Current   Smokeless tobacco: Never  Vaping Use   Vaping Use: Never used  Substance Use Topics   Alcohol use: Not Currently    Comment: drinks 2 wine coolers a day   Drug use: No   Allergies Gabapentin, Oxcarbazepine, Fluoxetine, Tramadol hcl, Ultram  [tramadol hcl], and Codeine  Review of Systems Review of Systems  Gastrointestinal:  Positive for constipation.    Physical Exam Vital Signs  I have reviewed the triage vital signs BP (!) 107/91 (BP Location: Right Arm)   Pulse 91   Temp 98.2 F  (36.8 C) (Oral)  Resp 17   Ht '5\' 6"'$  (1.676 m)   Wt 59 kg   SpO2 99%   BMI 20.98 kg/m   Physical Exam Vitals and nursing note reviewed.  Constitutional:      General: She is not in acute distress.    Appearance: She is well-developed.  HENT:     Head: Normocephalic and atraumatic.  Eyes:     Conjunctiva/sclera: Conjunctivae normal.  Cardiovascular:     Rate and Rhythm: Normal rate and regular rhythm.     Heart sounds: No murmur heard. Pulmonary:     Effort: Pulmonary effort is normal. No respiratory distress.  Abdominal:     General: There is distension.     Tenderness: There is abdominal tenderness.  Genitourinary:    Comments: Hard stool ball seen in rectum Musculoskeletal:        General: No swelling.     Cervical back: Neck supple.  Skin:    General: Skin is warm and dry.     Capillary Refill: Capillary refill takes less than 2 seconds.  Neurological:     Mental Status: She is alert.  Psychiatric:        Mood and Affect: Mood normal.     ED Results and Treatments Labs (all labs ordered are listed, but only abnormal results are displayed) Labs Reviewed - No data to display                                                                                                                        Radiology No results found.  Pertinent labs & imaging results that were available during my care of the patient were reviewed by me and considered in my medical decision making (see MDM for details).  Medications Ordered in ED Medications - No data to display                                                                                                                                   Procedures Fecal disimpaction  Date/Time: 02/15/2022 12:45 PM  Performed by: Teressa Lower, MD Authorized by: Teressa Lower, MD  Consent: Verbal consent obtained. Consent given by: patient Patient understanding: patient states understanding of the procedure being performed Patient  consent: the patient's understanding of the procedure matches consent given Patient identity confirmed: verbally with patient Time out: Immediately prior to procedure a "time out" was called to verify the correct patient,  procedure, equipment, support staff and site/side marked as required. Local anesthesia used: no  Anesthesia: Local anesthesia used: no  Sedation: Patient sedated: yes Sedation type: anxiolysis Sedatives: midazolam  Patient tolerance: patient tolerated the procedure well with no immediate complications     (including critical care time)  Medical Decision Making / ED Course   This patient presents to the ED for concern of constipation, this involves an extensive number of treatment options, and is a complaint that carries with it a high risk of complications and morbidity.  The differential diagnosis includes constipation, fecal impaction, ileus, obstruction  MDM: Patient seen emergency room for evaluation of constipation.  Physical exam with abdominal distention and a hard stool ball seen in the rectal vault.  CT showing a large stool burden throughout the colon particularly at the rectosigmoid colon concerning for fecal impaction.  Attempted to provide the patient with appropriate anxiolysis using Versed, but after the first 0.05 mg/kg dose, patient had absolutely no effects with this medication and additional 2 mg was given.  I performed a fecal disimpaction leading to evacuation of a large amount of hard stool.  Patient has an hour long drive home and would not like to start additional laxative therapy until tomorrow.  I attempted to send GoLytely to the patient's pharmacy but this is not covered by her insurance so instead we will use lactulose that is covered.  Patient then discharged with outpatient follow-up   Additional history obtained: -Additional history obtained from son -External records from outside source obtained and reviewed including: Chart review  including previous notes, labs, imaging, consultation notes     Imaging Studies ordered: I ordered imaging studies including CT abdomen pelvis I independently visualized and interpreted imaging. I agree with the radiologist interpretation   Medicines ordered and prescription drug management: No orders of the defined types were placed in this encounter.   -I have reviewed the patients home medicines and have made adjustments as needed  Critical interventions none   Cardiac Monitoring: The patient was maintained on a cardiac monitor.  I personally viewed and interpreted the cardiac monitored which showed an underlying rhythm of: NSR  Social Determinants of Health:  Factors impacting patients care include: none   Reevaluation: After the interventions noted above, I reevaluated the patient and found that they have :improved  Co morbidities that complicate the patient evaluation  Past Medical History:  Diagnosis Date   Anxiety    can cause palpitations   Balance problems    had brain scan (10/02/19) / clearance given by Dr Caryn Section   Cataract    Complication of anesthesia    patient state, "they have to give me alot to go out" during colonoscopy   Depression    GERD (gastroesophageal reflux disease)    Glaucoma    Hepatitis    B w treatment/ been cleared (Dr Maralyn Sago records)   History of chicken pox    Hypertension    controlled on meds   Osteoporosis    Sepsis ([place]) 01/17/2016   Wears dentures    upper      Dispostion: I considered admission for this patient, but she does not meet inpatient criteria for admission she is safe for discharge with outpatient follow-up     Final Clinical Impression(s) / ED Diagnoses Final diagnoses:  None     '@PCDICTATION'$ @    Teressa Lower, MD 02/15/22 1248

## 2022-02-14 NOTE — ED Notes (Signed)
Attempted IV x 2 without success.  CN in to assess for access.

## 2022-02-24 ENCOUNTER — Telehealth: Payer: Self-pay

## 2022-02-24 NOTE — Telephone Encounter (Signed)
        Patient  visited [facility] on 2/5     Telephone encounter attempt :1st    Unable to [place], [place] [phone] [address], [place], [place] [address] Phone: [phone] Email: [email or web address]

## 2022-02-25 ENCOUNTER — Telehealth: Payer: Self-pay | Admitting: Family Medicine

## 2022-02-26 ENCOUNTER — Telehealth: Payer: Self-pay

## 2022-02-26 NOTE — Telephone Encounter (Signed)
        Patient  visited [facility] on 2/5    Telephone encounter attempt : 2nd   A HIPAA compliant voice message was left requesting a return call.  Instructed patient to call back    [place] [phone] [address], [place], [place] [address] Phone: [phone] Email: [email or web address]

## 2022-02-27 ENCOUNTER — Other Ambulatory Visit: Payer: Self-pay | Admitting: Family Medicine

## 2022-02-27 DIAGNOSIS — I10 Essential (primary) hypertension: Secondary | ICD-10-CM

## 2022-02-28 ENCOUNTER — Other Ambulatory Visit: Payer: Self-pay | Admitting: Family Medicine

## 2022-02-28 DIAGNOSIS — G4700 Insomnia, unspecified: Secondary | ICD-10-CM

## 2022-03-19 ENCOUNTER — Ambulatory Visit: Payer: Medicare Other | Admitting: Family Medicine

## 2022-03-22 ENCOUNTER — Ambulatory Visit (INDEPENDENT_AMBULATORY_CARE_PROVIDER_SITE_OTHER): Payer: Medicare Other | Admitting: Family Medicine

## 2022-03-22 ENCOUNTER — Encounter: Payer: Self-pay | Admitting: Family Medicine

## 2022-03-22 VITALS — BP 144/66 | HR 84 | Temp 98.0°F | Ht 66.5 in | Wt 127.0 lb

## 2022-03-22 DIAGNOSIS — M810 Age-related osteoporosis without current pathological fracture: Secondary | ICD-10-CM | POA: Diagnosis not present

## 2022-03-22 DIAGNOSIS — K219 Gastro-esophageal reflux disease without esophagitis: Secondary | ICD-10-CM

## 2022-03-22 DIAGNOSIS — F419 Anxiety disorder, unspecified: Secondary | ICD-10-CM

## 2022-03-22 DIAGNOSIS — I10 Essential (primary) hypertension: Secondary | ICD-10-CM | POA: Diagnosis not present

## 2022-03-22 NOTE — Progress Notes (Addendum)
Established patient visit   Patient: Joan Baldwin   DOB: [birth year]   77 y.o. Female  MRN: [id number] Visit Date: 03/22/2022  Today's healthcare provider: Lelon Huh, MD   No chief complaint on file.  Subjective    Here today to follow up on hypertension, anxiety, insomnia, and osteoporosis. She is generally feeling well. Reports home blood pressure much better than office readings. Tolerating medications well. Had been on alendronate for about 5 years and no on bisphosphonate holiday since last summer. No new complaints today.   Medications: Outpatient Medications Prior to Visit  Medication Sig   amLODipine (NORVASC) 5 MG tablet Take 1 tablet by mouth once daily   Calcium Carbonate-Vitamin D (CALCIUM 600/VITAMIN D PO) Take 1 tablet by mouth daily.    diazepam (VALIUM) 10 MG tablet TAKE 1/2 TO 1 (ONE-HALF TO ONE) TABLET BY MOUTH 4 TIMES DAILY AS NEEDED FOR ANXIETY   Fiber POWD Take 2 scoop by mouth daily.   FLUoxetine (PROZAC) 20 MG capsule TAKE 1 CAPSULE BY MOUTH ONCE DAILY TAKE  ALONG  WITH  40  MG  CAPSULE  DAILY   lactulose (CHRONULAC) 10 GM/15ML solution Take 45 mLs (30 g total) by mouth 3 (three) times daily.   latanoprost (XALATAN) 0.005 % ophthalmic solution Place 1 drop into both eyes at bedtime.   lidocaine (LIDODERM) 5 % Place 1 patch onto the skin daily. Remove & Discard patch within 12 hours or as directed by MD   lidocaine (XYLOCAINE) 2 % solution USE AS DIRECTED 15ML IN MOUTH OR THROAT EVERY 4-6 HOURS AS NEEDED FOR MOUTH PAIN   lisinopril (ZESTRIL) 10 MG tablet Take 1 tablet (10 mg total) by mouth daily.   Multiple Vitamins-Minerals (CENTRUM SILVER) tablet    Omega-3 Fatty Acids (FISH OIL) 1000 MG CAPS Take 1 capsule by mouth daily.   omeprazole (PRILOSEC) 20 MG capsule Take 1 capsule by mouth once daily   QUEtiapine (SEROQUEL) 50 MG tablet TAKE 1 TABLET BY MOUTH AT BEDTIME   timolol (TIMOPTIC) 0.5 % ophthalmic solution    traZODone (DESYREL) 100 MG  tablet TAKE 1 TABLET BY MOUTH AT BEDTIME   No facility-administered medications prior to visit.    Review of Systems  Constitutional:  Negative for appetite change, chills, fatigue and fever.  Respiratory:  Negative for chest tightness and shortness of breath.   Cardiovascular:  Negative for chest pain and palpitations.  Gastrointestinal:  Negative for abdominal pain, nausea and vomiting.  Neurological:  Negative for dizziness and weakness.       Objective    BP (!) 144/66 (BP Location: Left Arm, Patient Position: Sitting, Cuff Size: Small)   Pulse 84   Temp 98 F (36.7 C)   Ht 5' 6.5" (1.689 m)   Wt 127 lb (57.6 kg)   SpO2 98%   BMI 20.19 kg/m    Physical Exam   General: Appearance:    Well developed, well nourished female in no acute distress  Eyes:    PERRL, conjunctiva/corneas clear, EOM's intact       Lungs:     Clear to auscultation bilaterally, respirations unlabored  Heart:    Normal heart rate. Normal rhythm. No murmurs, rubs, or gallops.    MS:   All extremities are intact.    Neurologic:   Awake, alert, oriented x 3. No apparent focal neurological defect.         Assessment & Plan     1. Essential (primary)  hypertension Doing well on current medications with home BP better than office readings. Continue current medications.   - CBC - Comprehensive metabolic panel - Lipid panel - Magnesium  2. Anxiety Doing well on current medication regiment.   3. Gastroesophageal reflux disease without esophagitis Doing well on current PPI  4. Osteoporosis, unspecified osteoporosis type, unspecified pathological fracture presence Did well on alendronate, currently on bisphosphonate holiday. Anticipate resuming later this year.         Lelon Huh, MD  [place] [phone] (phone) [phone] (fax)  [place]

## 2022-03-24 DIAGNOSIS — M810 Age-related osteoporosis without current pathological fracture: Secondary | ICD-10-CM | POA: Diagnosis not present

## 2022-03-24 DIAGNOSIS — K219 Gastro-esophageal reflux disease without esophagitis: Secondary | ICD-10-CM | POA: Diagnosis not present

## 2022-03-24 DIAGNOSIS — I10 Essential (primary) hypertension: Secondary | ICD-10-CM | POA: Diagnosis not present

## 2022-03-25 LAB — COMPREHENSIVE METABOLIC PANEL
ALT: 7 IU/L (ref 0–32)
AST: 17 IU/L (ref 0–40)
Albumin/Globulin Ratio: 1.5 (ref 1.2–2.2)
Albumin: 3.8 g/dL (ref 3.8–4.8)
Alkaline Phosphatase: 73 IU/L (ref 44–121)
BUN/Creatinine Ratio: 9 — ABNORMAL LOW (ref 12–28)
BUN: 7 mg/dL — ABNORMAL LOW (ref 8–27)
Bilirubin Total: <0.2 mg/dL (ref 0.0–1.2)
CO2: 25 mmol/L (ref 20–29)
Calcium: 9.1 mg/dL (ref 8.7–10.3)
Chloride: 104 mmol/L (ref 96–106)
Creatinine, Ser: 0.79 mg/dL (ref 0.57–1.00)
Globulin, Total: 2.6 g/dL (ref 1.5–4.5)
Glucose: 108 mg/dL — ABNORMAL HIGH (ref 70–99)
Potassium: 3.8 mmol/L (ref 3.5–5.2)
Sodium: 148 mmol/L — ABNORMAL HIGH (ref 134–144)
Total Protein: 6.4 g/dL (ref 6.0–8.5)
eGFR: 76 mL/min/{1.73_m2} (ref >59)

## 2022-03-25 LAB — LIPID PANEL
Chol/HDL Ratio: 5.2 ratio — ABNORMAL HIGH (ref 0.0–4.4)
Cholesterol, Total: 203 mg/dL — ABNORMAL HIGH (ref 100–199)
HDL: 39 mg/dL — ABNORMAL LOW (ref >39)
LDL Chol Calc (NIH): 132 mg/dL — ABNORMAL HIGH (ref 0–99)
Triglycerides: 176 mg/dL — ABNORMAL HIGH (ref 0–149)
VLDL Cholesterol Cal: 32 mg/dL (ref 5–40)

## 2022-03-25 LAB — CBC
Hematocrit: 39.6 % (ref 34.0–46.6)
Hemoglobin: 12.9 g/dL (ref 11.1–15.9)
MCH: 30 pg (ref 26.6–33.0)
MCHC: 32.6 g/dL (ref 31.5–35.7)
MCV: 92 fL (ref 79–97)
Platelets: 336 10*3/uL (ref 150–450)
RBC: 4.3 x10E6/uL (ref 3.77–5.28)
RDW: 12.9 % (ref 11.7–15.4)
WBC: 6.9 10*3/uL (ref 3.4–10.8)

## 2022-03-25 LAB — MAGNESIUM: Magnesium: 2.1 mg/dL (ref 1.6–2.3)

## 2022-04-01 ENCOUNTER — Other Ambulatory Visit: Payer: Self-pay | Admitting: Family Medicine

## 2022-04-01 DIAGNOSIS — F419 Anxiety disorder, unspecified: Secondary | ICD-10-CM

## 2022-04-01 NOTE — Telephone Encounter (Signed)
Requested medication (s) are due for refill today -yes  Requested medication (s) are on the active medication list -yes  Future visit scheduled -yes  Last refill: 01/10/22 #120 2RF  Notes to clinic: non delegated Rx  Requested Prescriptions  Pending Prescriptions Disp Refills   diazepam (VALIUM) 10 MG tablet [Pharmacy Med Name: diazePAM 10 MG Oral Tablet] 120 tablet 0    Sig: TAKE 1/2 TO 1 (ONE-HALF TO ONE) TABLET BY MOUTH 4 TIMES DAILY AS NEEDED FOR ANXIETY     Not Delegated - Psychiatry: Anxiolytics/Hypnotics 2 Failed - 04/01/2022 11:15 AM      Failed - This refill cannot be delegated      Failed - Urine Drug Screen completed in last 360 days      Passed - Patient is not pregnant      Passed - Valid encounter within last 6 months    Recent Outpatient Visits           1 week ago Essential (primary) hypertension   [place] Birdie Sons, MD   4 months ago Closed fracture of multiple ribs of right side, initial encounter   [place], Donald E, MD   5 months ago Closed fracture of multiple ribs of right side, initial encounter   [place], Donald E, MD   7 months ago Essential (primary) hypertension   [place], Donald E, MD   9 months ago Hyponatremia   [facility] Birdie Sons, MD       Future Appointments             In 4 months Fisher, Kirstie Peri, MD [place], [facility]               Requested Prescriptions  Pending Prescriptions Disp Refills   diazepam (VALIUM) 10 MG tablet [Pharmacy Med Name: diazePAM 10 MG Oral Tablet] 120 tablet 0    Sig: TAKE 1/2 TO 1 (ONE-HALF TO ONE) TABLET BY MOUTH 4 TIMES DAILY AS NEEDED FOR ANXIETY     Not Delegated - Psychiatry: Anxiolytics/Hypnotics 2 Failed - 04/01/2022 11:15 AM      Failed - This refill cannot be delegated      Failed  - Urine Drug Screen completed in last 360 days      Passed - Patient is not pregnant      Passed - Valid encounter within last 6 months    Recent Outpatient Visits           1 week ago Essential (primary) hypertension   [place], Donald E, MD   4 months ago Closed fracture of multiple ribs of right side, initial encounter   [place], Donald E, MD   5 months ago Closed fracture of multiple ribs of right side, initial encounter   [place], Donald E, MD   7 months ago Essential (primary) hypertension   [place], Donald E, MD   9 months ago Hyponatremia   [facility] Birdie Sons, MD       Future Appointments             In 4 months Fisher, Kirstie Peri, MD [facility], [facility]

## 2022-05-16 ENCOUNTER — Other Ambulatory Visit: Payer: Self-pay | Admitting: Family Medicine

## 2022-05-23 ENCOUNTER — Other Ambulatory Visit: Payer: Self-pay | Admitting: Family Medicine

## 2022-05-23 DIAGNOSIS — G4700 Insomnia, unspecified: Secondary | ICD-10-CM

## 2022-05-25 ENCOUNTER — Other Ambulatory Visit: Payer: Self-pay | Admitting: Family Medicine

## 2022-05-25 DIAGNOSIS — I10 Essential (primary) hypertension: Secondary | ICD-10-CM

## 2022-05-25 NOTE — Telephone Encounter (Signed)
Requested Prescriptions  Pending Prescriptions Disp Refills   amLODipine (NORVASC) 5 MG tablet [Pharmacy Med Name: amLODIPine Besylate 5 MG Oral Tablet] 90 tablet 0    Sig: Take 1 tablet by mouth once daily     Cardiovascular: Calcium Channel Blockers 2 Failed - 05/25/2022  9:49 AM      Failed - Last BP in normal range    BP Readings from Last 1 Encounters:  03/22/22 (!) 144/66         Passed - Last Heart Rate in normal range    Pulse Readings from Last 1 Encounters:  03/22/22 84         Passed - Valid encounter within last 6 months    Recent Outpatient Visits           2 months ago Essential (primary) hypertension   [facility] Malva Limes, MD   6 months ago Closed fracture of multiple ribs of right side, initial encounter   [facility] Malva Limes, MD   7 months ago Closed fracture of multiple ribs of right side, initial encounter   [facility] Malva Limes, MD   8 months ago Essential (primary) hypertension   [facility] Malva Limes, MD   11 months ago Hyponatremia   [facility] Malva Limes, MD       Future Appointments             In 2 months Fisher, Demetrios Isaacs, MD [facility], [facility]

## 2022-06-11 ENCOUNTER — Telehealth: Payer: Self-pay

## 2022-06-11 NOTE — Telephone Encounter (Signed)
Copied from CRM [id number]. Topic: General - Other >> Jun 11, 2022  1:40 PM [place] B wrote: Reason for CRM: pt called in says daughter wants her to see a psychiatrist, but she's saying she doesn't need one and just wanted to relay this to Dr Sherrie Mustache

## 2022-06-21 ENCOUNTER — Ambulatory Visit (INDEPENDENT_AMBULATORY_CARE_PROVIDER_SITE_OTHER): Payer: Medicare Other

## 2022-06-21 VITALS — Ht 65.0 in | Wt 132.0 lb

## 2022-06-21 DIAGNOSIS — Z0000 Encounter for general adult medical examination without abnormal findings: Secondary | ICD-10-CM

## 2022-06-21 NOTE — Patient Instructions (Signed)
Joan Baldwin , Thank you for taking time to come for your Medicare Wellness Visit. I appreciate your ongoing commitment to your health goals. Please review the following plan we discussed and let me know if I can assist you in the future.   These are the goals we discussed:  Goals      DIET - EAT MORE FRUITS AND VEGETABLES     DIET - INCREASE WATER INTAKE     Recommend increasing water intake to 4-6 glasses a day.      Exercise 3x per week (30 min per time)     Recommend exercising 4 days a week for 30 minutes. Pt plans to do this once feeling better.         This is a list of the screening recommended for you and due dates:  Health Maintenance  Topic Date Due   DTaP/Tdap/Td vaccine (1 - Tdap) Never done   COVID-19 Vaccine (3 - Pfizer risk series) 06/05/2019   Colon Cancer Screening  12/23/2021   Flu Shot  08/12/2022   Screening for Lung Cancer  10/07/2022   DEXA scan (bone density measurement)  05/01/2023   Medicare Annual Wellness Visit  06/21/2023   Pneumonia Vaccine  Completed   Hepatitis C Screening  Completed   Zoster (Shingles) Vaccine  Completed   HPV Vaccine  Aged Out    Advanced directives: yes  Conditions/risks identified: moderate falls risk  Next appointment: Follow up in one year for your annual wellness visit 06/22/2023 @1pm  telephone   Preventive Care 65 Years and Older, Female Preventive care refers to lifestyle choices and visits with your health care provider that can promote health and wellness. What does preventive care include? A yearly physical exam. This is also called an annual well check. Dental exams once or twice a year. Routine eye exams. Ask your health care provider how often you should have your eyes checked. Personal lifestyle choices, including: Daily care of your teeth and gums. Regular physical activity. Eating a healthy diet. Avoiding tobacco and drug use. Limiting alcohol use. Practicing safe sex. Taking low-dose aspirin every  day. Taking vitamin and mineral supplements as recommended by your health care provider. What happens during an annual well check? The services and screenings done by your health care provider during your annual well check will depend on your age, overall health, lifestyle risk factors, and family history of disease. Counseling  Your health care provider may ask you questions about your: Alcohol use. Tobacco use. Drug use. Emotional well-being. Home and relationship well-being. Sexual activity. Eating habits. History of falls. Memory and ability to understand (cognition). Work and work Astronomer. Reproductive health. Screening  You may have the following tests or measurements: Height, weight, and BMI. Blood pressure. Lipid and cholesterol levels. These may be checked every 5 years, or more frequently if you are over 27 years old. Skin check. Lung cancer screening. You may have this screening every year starting at age 74 if you have a 30-pack-year history of smoking and currently smoke or have quit within the past 15 years. Fecal occult blood test (FOBT) of the stool. You may have this test every year starting at age 8. Flexible sigmoidoscopy or colonoscopy. You may have a sigmoidoscopy every 5 years or a colonoscopy every 10 years starting at age 64. Hepatitis C blood test. Hepatitis B blood test. Sexually transmitted disease (STD) testing. Diabetes screening. This is done by checking your blood sugar (glucose) after you have not eaten for a  while (fasting). You may have this done every 1-3 years. Bone density scan. This is done to screen for osteoporosis. You may have this done starting at age 71. Mammogram. This may be done every 1-2 years. Talk to your health care provider about how often you should have regular mammograms. Talk with your health care provider about your test results, treatment options, and if necessary, the need for more tests. Vaccines  Your health care  provider may recommend certain vaccines, such as: Influenza vaccine. This is recommended every year. Tetanus, diphtheria, and acellular pertussis (Tdap, Td) vaccine. You may need a Td booster every 10 years. Zoster vaccine. You may need this after age 68. Pneumococcal 13-valent conjugate (PCV13) vaccine. One dose is recommended after age 27. Pneumococcal polysaccharide (PPSV23) vaccine. One dose is recommended after age 37. Talk to your health care provider about which screenings and vaccines you need and how often you need them. This information is not intended to replace advice given to you by your health care provider. Make sure you discuss any questions you have with your health care provider. Document Released: 01/24/2015 Document Revised: 09/17/2015 Document Reviewed: 10/29/2014 Elsevier Interactive Patient Education  2017 [employer].  Fall Prevention in the Home Falls can cause injuries. They can happen to people of all ages. There are many things you can do to make your home safe and to help prevent falls. What can I do on the outside of my home? Regularly fix the edges of walkways and driveways and fix any cracks. Remove anything that might make you trip as you walk through a door, such as a raised step or threshold. Trim any bushes or trees on the path to your home. Use bright outdoor lighting. Clear any walking paths of anything that might make someone trip, such as rocks or tools. Regularly check to see if handrails are loose or broken. Make sure that both sides of any steps have handrails. Any raised decks and porches should have guardrails on the edges. Have any leaves, snow, or ice cleared regularly. Use sand or salt on walking paths during winter. Clean up any spills in your garage right away. This includes oil or grease spills. What can I do in the bathroom? Use night lights. Install grab bars by the toilet and in the tub and shower. Do not use towel bars as grab  bars. Use non-skid mats or decals in the tub or shower. If you need to sit down in the shower, use a plastic, non-slip stool. Keep the floor dry. Clean up any water that spills on the floor as soon as it happens. Remove soap buildup in the tub or shower regularly. Attach bath mats securely with double-sided non-slip rug tape. Do not have throw rugs and other things on the floor that can make you trip. What can I do in the bedroom? Use night lights. Make sure that you have a light by your bed that is easy to reach. Do not use any sheets or blankets that are too big for your bed. They should not hang down onto the floor. Have a firm chair that has side arms. You can use this for support while you get dressed. Do not have throw rugs and other things on the floor that can make you trip. What can I do in the kitchen? Clean up any spills right away. Avoid walking on wet floors. Keep items that you use a lot in easy-to-reach places. If you need to reach something above you,  use a strong step stool that has a grab bar. Keep electrical cords out of the way. Do not use floor polish or wax that makes floors slippery. If you must use wax, use non-skid floor wax. Do not have throw rugs and other things on the floor that can make you trip. What can I do with my stairs? Do not leave any items on the stairs. Make sure that there are handrails on both sides of the stairs and use them. Fix handrails that are broken or loose. Make sure that handrails are as long as the stairways. Check any carpeting to make sure that it is firmly attached to the stairs. Fix any carpet that is loose or worn. Avoid having throw rugs at the top or bottom of the stairs. If you do have throw rugs, attach them to the floor with carpet tape. Make sure that you have a light switch at the top of the stairs and the bottom of the stairs. If you do not have them, ask someone to add them for you. What else can I do to help prevent  falls? Wear shoes that: Do not have high heels. Have rubber bottoms. Are comfortable and fit you well. Are closed at the toe. Do not wear sandals. If you use a stepladder: Make sure that it is fully opened. Do not climb a closed stepladder. Make sure that both sides of the stepladder are locked into place. Ask someone to hold it for you, if possible. Clearly mark and make sure that you can see: Any grab bars or handrails. First and last steps. Where the edge of each step is. Use tools that help you move around (mobility aids) if they are needed. These include: Canes. Walkers. Scooters. Crutches. Turn on the lights when you go into a dark area. Replace any light bulbs as soon as they burn out. Set up your furniture so you have a clear path. Avoid moving your furniture around. If any of your floors are uneven, fix them. If there are any pets around you, be aware of where they are. Review your medicines with your doctor. Some medicines can make you feel dizzy. This can increase your chance of falling. Ask your doctor what other things that you can do to help prevent falls. This information is not intended to replace advice given to you by your health care provider. Make sure you discuss any questions you have with your health care provider. Document Released: 10/24/2008 Document Revised: 06/05/2015 Document Reviewed: 02/01/2014 Elsevier Interactive Patient Education  2017 [employer].

## 2022-06-21 NOTE — Progress Notes (Signed)
I connected with  Siobahn S Alers on 06/21/22 by a audio enabled telemedicine application and verified that I am speaking with the correct person using two identifiers.  Patient Location: Home  Provider Location: Home Office  I discussed the limitations of evaluation and management by telemedicine. The patient expressed understanding and agreed to proceed.  Subjective:   Joan Baldwin is a 77 y.o. female who presents for Medicare Annual (Subsequent) preventive examination.  Review of Systems    Cardiac Risk Factors include: advanced age (>69men, >87 women);hypertension;sedentary lifestyle;smoking/ tobacco exposure    Objective:    Today's Vitals   06/21/22 1305  Weight: 132 lb (59.9 kg)  Height: 5\' 5"  (1.651 m)   Body mass index is 21.97 kg/m.     06/21/2022    1:22 PM 02/14/2022    7:54 PM 10/05/2021    9:05 PM 06/16/2021    1:31 PM 06/11/2021    8:42 PM 02/05/2021    3:30 PM 02/04/2021    8:59 PM  Advanced Directives  Does Patient Have a Medical Advance Directive? Yes No No No Yes  No  Type of Advance Directive Healthcare Power of Attorney    Living will    Does patient want to make changes to medical advance directive?    Yes (ED - Information included in AVS)     Would patient like information on creating a medical advance directive?    No - Patient declined No - Patient declined       Information is confidential and restricted. Go to Review Flowsheets to unlock data.    Current Medications (verified) Outpatient Encounter Medications as of 06/21/2022  Medication Sig   amLODipine (NORVASC) 5 MG tablet Take 1 tablet by mouth once daily   Calcium Carbonate-Vitamin D (CALCIUM 600/VITAMIN D PO) Take 1 tablet by mouth daily.    diazepam (VALIUM) 10 MG tablet TAKE 1/2 TO 1 (ONE-HALF TO ONE) TABLET BY MOUTH 4 TIMES DAILY AS NEEDED FOR ANXIETY   Fiber POWD Take 2 scoop by mouth daily.   FLUoxetine (PROZAC) 20 MG capsule TAKE 1 CAPSULE BY MOUTH ONCE DAILY TAKE  ALONG  WITH  40   MG  CAPSULE  DAILY   lactulose (CHRONULAC) 10 GM/15ML solution Take 45 mLs (30 g total) by mouth 3 (three) times daily.   latanoprost (XALATAN) 0.005 % ophthalmic solution Place 1 drop into both eyes at bedtime.   lidocaine (LIDODERM) 5 % Place 1 patch onto the skin daily. Remove & Discard patch within 12 hours or as directed by MD   lidocaine (XYLOCAINE) 2 % solution USE AS DIRECTED IN MOUTH OR THROAT EVERY 4-6 HOURS AS NEEDED FOR MOUTH PAIN   lisinopril (ZESTRIL) 10 MG tablet Take 1 tablet (10 mg total) by mouth daily.   Multiple Vitamins-Minerals (CENTRUM SILVER) tablet    Omega-3 Fatty Acids (FISH OIL) 1000 MG CAPS Take 1 capsule by mouth daily.   omeprazole (PRILOSEC) 20 MG capsule Take 1 capsule by mouth once daily   QUEtiapine (SEROQUEL) 50 MG tablet TAKE 1 TABLET BY MOUTH AT BEDTIME   timolol (TIMOPTIC) 0.5 % ophthalmic solution    traZODone (DESYREL) 100 MG tablet TAKE 1 TABLET BY MOUTH AT BEDTIME   No facility-administered encounter medications on file as of 06/21/2022.    Allergies (verified) Gabapentin, Oxcarbazepine, Fluoxetine, Tramadol hcl, Ultram  [tramadol hcl], and Codeine   History: Past Medical History:  Diagnosis Date   Anxiety    can cause palpitations  Balance problems    had brain scan (10/02/19) / clearance given by Dr Sherrie Mustache   Cataract    Complication of anesthesia    patient state, "they have to give me alot to go out" during colonoscopy   Depression    GERD (gastroesophageal reflux disease)    Glaucoma    Hepatitis    B w treatment/ been cleared (Dr Theodis Aguas records)   History of chicken pox    Hypertension    controlled on meds   Osteoporosis    Sepsis (HCC) 01/17/2016   Wears dentures    upper   Past Surgical History:  Procedure Laterality Date   ABDOMINAL HYSTERECTOMY     vaginal. Still has both ovaries   back surgeries     x 2   CATARACT EXTRACTION W/PHACO Left 10/09/2019   Procedure: CATARACT EXTRACTION PHACO AND INTRAOCULAR LENS  PLACEMENT (IOC) LEFT;  Surgeon: Galen Manila, MD;  Location: [facility];  Service: Ophthalmology;  Laterality: Left;  6.16 0:46.0   CATARACT EXTRACTION W/PHACO Right 10/30/2019   Procedure: CATARACT EXTRACTION PHACO AND INTRAOCULAR LENS PLACEMENT (IOC) RIGHT 6.31 00:46.1;  Surgeon: Galen Manila, MD;  Location: [facility];  Service: Ophthalmology;  Laterality: Right;   COLONOSCOPY  2007   done in [place] per patient. Normal except for moderate hemorrhoids   MOUTH SURGERY  2011   STOMACH SURGERY     stapled for obesity/later reversed   Tailbone surgery     removal of coccyx   Family History  Problem Relation Age of Onset   Emphysema Mother    Kidney failure Sister    Heart Problems Brother    Diabetes Brother    Skin cancer Brother    Breast cancer Neg Hx    Social History   Socioeconomic History   Marital status: Widowed    Spouse name: Not on file   Number of children: 3   Years of education: Not on file   Highest education level: 12th grade  Occupational History   Occupation: Retired  Tobacco Use   Smoking status: Some Days    Packs/day: 1.00    Years: 30.00    Additional pack years: 0.00    Total pack years: 30.00    Types: Cigarettes    Last attempt to quit: 01/11/2001    Years since quitting: 21.4    Passive exposure: Current   Smokeless tobacco: Never  Vaping Use   Vaping Use: Never used  Substance and Sexual Activity   Alcohol use: Not Currently    Comment: drinks 2 wine coolers a day   Drug use: No   Sexual activity: Not on file  Other Topics Concern   Not on file  Social History Narrative   Not on file   Social Determinants of Health   Financial Resource Strain: Low Risk  (06/21/2022)   Overall Financial Resource Strain (CARDIA)    Difficulty of Paying Living Expenses: Not hard at all  Food Insecurity: No Food Insecurity (06/21/2022)   Hunger Vital Sign    Worried About Running Out of Food in the Last Year: Never true     Ran Out of Food in the Last Year: Never true  Transportation Needs: No Transportation Needs (06/21/2022)   PRAPARE - Administrator, Civil Service (Medical): No    Lack of Transportation (Non-Medical): No  Physical Activity: Inactive (06/21/2022)   Exercise Vital Sign    Days of Exercise per Week: 0 days  Minutes of Exercise per Session: 0 min  Stress: Stress Concern Present (06/21/2022)   [employer] of Occupational Health - Occupational Stress Questionnaire    Feeling of Stress : To some extent  Social Connections: Moderately Isolated (06/21/2022)   Social Connection and Isolation Panel [NHANES]    Frequency of Communication with Friends and Family: More than three times a week    Frequency of Social Gatherings with Friends and Family: Once a week    Attends Religious Services: 1 to 4 times per year    Active Member of [employer] or Organizations: No    Attends Banker Meetings: Never    Marital Status: Widowed    Tobacco Counseling Ready to quit: Not Answered Counseling given: Not Answered   Clinical Intake:  Pre-visit preparation completed: Yes  Pain : No/denies pain     BMI - recorded: 21.97 Nutritional Status: BMI of 19-24  Normal Nutritional Risks: None Diabetes: No     Diabetic?no  Interpreter Needed?: No  Comments: daughter in law lives with her Information entered by :: B.Kym Fenter,LPN   Activities of Daily Living    06/21/2022    1:23 PM 10/14/2021    9:31 AM  In your present state of health, do you have any difficulty performing the following activities:  Hearing? 0 0  Vision? 0 0  Difficulty concentrating or making decisions? 1 1  Walking or climbing stairs? 1 1  Dressing or bathing? 0 1  Doing errands, shopping? 1 1  Preparing Food and eating ? Y   Using the Toilet? N   In the past six months, have you accidently leaked urine? N   Do you have problems with loss of bowel control? N   Managing your Medications? Y    Managing your Finances? Y   Housekeeping or managing your Housekeeping? Y     Patient Care Team: Malva Limes, MD as PCP - General (Family Medicine) Galen Manila, MD as Referring Physician (Ophthalmology)  Indicate any recent Medical Services you may have received from other than [facility] providers in the past year (date may be approximate).     Assessment:   This is a routine wellness examination for Joan Baldwin.  Hearing/Vision screen Hearing Screening - Comments:: Adequate hearing Vision Screening - Comments:: Adequate vision;readers only No eye dr  Dietary issues and exercise activities discussed: Exercise limited by: psychological condition(s)   Goals Addressed             This Visit's Progress    DIET - EAT MORE FRUITS AND VEGETABLES   On track    Exercise 3x per week (30 min per time)   Not on track    Recommend exercising 4 days a week for 30 minutes. Pt plans to do this once feeling better.        Depression Screen    06/21/2022    1:12 PM 03/22/2022   11:07 AM 10/14/2021    9:30 AM 06/16/2021    1:29 PM 04/07/2021    3:20 PM 03/06/2021    9:27 AM 08/19/2020    9:55 AM  PHQ 2/9 Scores  PHQ - 2 Score 2 5 4 1 2 6 4   PHQ- 9 Score 7 13 12 1 6 17 11     Fall Risk    06/21/2022    1:08 PM 03/22/2022   11:07 AM 10/14/2021    9:30 AM 06/16/2021    1:33 PM 03/06/2021    9:26 AM  Fall Risk  Falls in the past year? 1 1 1 1 1   Number falls in past yr: 1 1 0 1 1  Injury with Fall? 0 1 1 1 1   Risk for fall due to : No Fall Risks  History of fall(s);Impaired balance/gait History of fall(s);Impaired balance/gait;Impaired mobility History of fall(s);Impaired balance/gait;Impaired mobility  Follow up Education provided;Falls prevention discussed  Falls evaluation completed;Education provided;Falls prevention discussed Falls evaluation completed;Falls prevention discussed Falls evaluation completed;Education provided;Falls prevention discussed    FALL RISK PREVENTION  PERTAINING TO THE HOME:  Any stairs in or around the home? Yes  If so, are there any without handrails? Yes  Home free of loose throw rugs in walkways, pet beds, electrical cords, etc? Yes  Adequate lighting in your home to reduce risk of falls? Yes   ASSISTIVE DEVICES UTILIZED TO PREVENT FALLS:  Life alert? No  Use of a cane, walker or w/c? Yes  Grab bars in the bathroom? Yes  Shower chair or bench in shower? Yes  Elevated toilet seat or a handicapped toilet? Yes    Cognitive Function:    06/26/2021    1:26 PM  MMSE - Mini Mental State Exam  Orientation to time 4  Orientation to Place 3  Registration 3  Attention/ Calculation 0  Recall 1  Language- name 2 objects 1  Language- repeat 1  Language- follow 3 step command 2  Language- read & follow direction 1  Write a sentence 1  Copy design 0  Total score 17        06/21/2022    1:15 PM 08/14/2019    9:53 AM 06/06/2018    9:31 AM 05/26/2016    9:14 AM  6CIT Screen  What Year? 0 points 0 points 0 points 0 points  What month? 0 points 0 points 0 points 0 points  What time? 0 points 0 points 0 points 0 points  Count back from 20 0 points 0 points 0 points 0 points  Months in reverse 0 points 0 points 2 points 0 points  Repeat phrase 8 points 2 points 0 points 6 points  Total Score 8 points 2 points 2 points 6 points    Immunizations Immunization History  Administered Date(s) Administered   Fluad Quad(high Dose 65+) 09/25/2019, 03/06/2021, 11/16/2021   Hepatitis B, ADULT 10/23/2018, 12/25/2018   Hepb-cpg 06/01/2019   Influenza, High Dose Seasonal PF 11/26/2014, 09/05/2015, 10/10/2016, 10/03/2017   Influenza-Unspecified 10/12/2018   PFIZER(Purple Top)SARS-COV-2 Vaccination 04/17/2019, 05/08/2019   Pneumococcal Conjugate-13 11/26/2014   Pneumococcal Polysaccharide-23 09/10/2008, 12/19/2009   Zoster Recombinat (Shingrix) 08/22/2018, 11/01/2018   Zoster, Live 11/21/2012    TDAP status: Up to date  Flu Vaccine  status: Up to date  Pneumococcal vaccine status: Up to date  Covid-19 vaccine status: Completed vaccines  Qualifies for Shingles Vaccine? Yes   Zostavax completed Yes   Shingrix Completed?: Yes  Screening Tests Health Maintenance  Topic Date Due   DTaP/Tdap/Td (1 - Tdap) Never done   COVID-19 Vaccine (3 - Pfizer risk series) 06/05/2019   Colonoscopy  12/23/2021   INFLUENZA VACCINE  08/12/2022   Lung Cancer Screening  10/07/2022   DEXA SCAN  05/01/2023   Medicare Annual Wellness (AWV)  06/21/2023   Pneumonia Vaccine 23+ Years old  Completed   Hepatitis C Screening  Completed   Zoster Vaccines- Shingrix  Completed   HPV VACCINES  Aged Out    Health Maintenance  Health Maintenance Due  Topic Date Due   DTaP/Tdap/Td (1 -  Tdap) Never done   COVID-19 Vaccine (3 - Pfizer risk series) 06/05/2019   Colonoscopy  12/23/2021    Colorectal cancer screening: No longer required.   Mammogram status: No longer required due to age.  Bone Density status: Completed yes. Results reflect: Bone density results: OSTEOPOROSIS. Repeat every 5 years.  Lung Cancer Screening: (Low Dose CT Chest recommended if Age 84-80 years, 30 pack-year currently smoking OR have quit w/in 15years.) does not qualify.   Lung Cancer Screening Referral: yes  Additional Screening:  Hepatitis C Screening: does not qualify; Completed yes  Vision Screening: Recommended annual ophthalmology exams for early detection of glaucoma and other disorders of the eye. Is the patient up to date with their annual eye exam?  Yes  Who is the provider or what is the name of the office in which the patient attends annual eye exams? Does not remember If pt is not established with a provider, would they like to be referred to a provider to establish care? No .   Dental Screening: Recommended annual dental exams for proper oral hygiene  Community Resource Referral / Chronic Care Management: CRR required this visit?  No   CCM  required this visit?  No      Plan:     I have personally reviewed and noted the following in the patient's chart:   Medical and social history Use of alcohol, tobacco or illicit drugs  Current medications and supplements including opioid prescriptions. Patient is not currently taking opioid prescriptions. Functional ability and status Nutritional status Physical activity Advanced directives List of other physicians Hospitalizations, surgeries, and ER visits in previous 12 months Vitals Screenings to include cognitive, depression, and falls Referrals and appointments  In addition, I have reviewed and discussed with patient certain preventive protocols, quality metrics, and best practice recommendations. A written personalized care plan for preventive services as well as general preventive health recommendations were provided to patient.     Sue Lush, LPN   [id number]/61/0960   Nurse Notes: The patient states she is doing well and has no concerns or questions at this time.

## 2022-06-28 ENCOUNTER — Other Ambulatory Visit: Payer: Self-pay | Admitting: Family Medicine

## 2022-06-28 DIAGNOSIS — F419 Anxiety disorder, unspecified: Secondary | ICD-10-CM

## 2022-06-28 NOTE — Telephone Encounter (Signed)
Please advise 

## 2022-07-03 ENCOUNTER — Other Ambulatory Visit: Payer: Self-pay | Admitting: Family Medicine

## 2022-07-03 DIAGNOSIS — I10 Essential (primary) hypertension: Secondary | ICD-10-CM

## 2022-07-05 NOTE — Telephone Encounter (Signed)
Unable to refill per protocol, Rx expired. Lisinopril was discontinued 08/31/21, dose change.  Requested Prescriptions  Pending Prescriptions Disp Refills   lisinopril (ZESTRIL) 20 MG tablet [Pharmacy Med Name: Lisinopril 20 MG Oral Tablet] 90 tablet 0    Sig: Take 1 tablet by mouth once daily     Cardiovascular:  ACE Inhibitors Failed - 07/03/2022 11:40 AM      Failed - Last BP in normal range    BP Readings from Last 1 Encounters:  03/22/22 (!) 144/66         Passed - Cr in normal range and within 180 days    Creatinine, Ser  Date Value Ref Range Status  03/24/2022 0.79 0.57 - 1.00 mg/dL Final         Passed - K in normal range and within 180 days    Potassium  Date Value Ref Range Status  03/24/2022 3.8 3.5 - 5.2 mmol/L Final         Passed - Patient is not pregnant      Passed - Valid encounter within last 6 months    Recent Outpatient Visits           3 months ago Essential (primary) hypertension   [facility] Malva Limes, MD   7 months ago Closed fracture of multiple ribs of right side, initial encounter   [facility] Malva Limes, MD   8 months ago Closed fracture of multiple ribs of right side, initial encounter   [facility] Mal[facility] Limes, MD   10 months ago Essential (primary) hypertension   [facility] Malva Limes, MD   1 year ago Hyponatremia   [place] [facility] Malva Limes, MD       Future Appointments             In 3 weeks Sherrie Mustache, Demetrios Isaacs, MD [facility], [facility]             omeprazole (PRILOSEC) 20 MG capsule [Pharmacy Med Name: Omeprazole 20 MG Oral Capsule Delayed Release] 90 capsule 0    Sig: Take 1 capsule by mouth once daily     Gastroenterology: Proton Pump Inhibitors Passed - 07/03/2022 11:40 AM      Passed - Valid encounter within last 12 months    Recent  Outpatient Visits           3 months ago Essential (primary) hypertension   [place] [facility] Malva Limes, MD   7 months ago Closed fracture of multiple ribs of right side, initial encounter   [facility] Malva [facility], MD   8 months ago Closed fracture of multiple ribs of right side, initial encounter   [facility] Malva Limes, MD   10 months ago Essential (primary) hypertension    [facility] Mal[facility] Limes, MD   1 year ago Hyponatremia   [facility] Malva Limes, MD       Future Appointments             In 3 weeks Fisher, Demetrios Isaacs, MD [facility], [facility]

## 2022-07-17 ENCOUNTER — Other Ambulatory Visit: Payer: Self-pay | Admitting: Family Medicine

## 2022-07-17 DIAGNOSIS — I10 Essential (primary) hypertension: Secondary | ICD-10-CM

## 2022-07-19 ENCOUNTER — Other Ambulatory Visit: Payer: Self-pay | Admitting: Family Medicine

## 2022-07-19 ENCOUNTER — Telehealth: Payer: Self-pay | Admitting: Family Medicine

## 2022-07-19 DIAGNOSIS — I10 Essential (primary) hypertension: Secondary | ICD-10-CM

## 2022-07-19 NOTE — Telephone Encounter (Signed)
Refused Lisinopril 20 mg because it was discontinued 08/31/2021.

## 2022-07-19 NOTE — Telephone Encounter (Signed)
[facility] pharmacy is requesting prescription refill lisinopril (ZESTRIL) 20 MG tablet  Please advise

## 2022-07-20 ENCOUNTER — Other Ambulatory Visit: Payer: Self-pay

## 2022-07-20 MED ORDER — LISINOPRIL 10 MG PO TABS: 10.0000 mg | ORAL_TABLET | Freq: Every day | ORAL | 4 refills | Status: DC

## 2022-07-20 NOTE — Telephone Encounter (Signed)
Unable to refill per protocol, Rx expired. Discontinue 08/31/21, dose change from 20 mg to 10mg .  Requested Prescriptions  Pending Prescriptions Disp Refills   lisinopril (ZESTRIL) 20 MG tablet [Pharmacy Med Name: Lisinopril 20 MG Oral Tablet] 90 tablet 0    Sig: Take 1 tablet by mouth once daily     Cardiovascular:  ACE Inhibitors Failed - 07/19/2022  2:06 PM      Failed - Last BP in normal range    BP Readings from Last 1 Encounters:  03/22/22 (!) 144/66         Passed - Cr in normal range and within 180 days    Creatinine, Ser  Date Value Ref Range Status  03/24/2022 0.79 0.57 - 1.00 mg/dL Final         Passed - K in normal range and within 180 days    Potassium  Date Value Ref Range Status  03/24/2022 3.8 3.5 - 5.2 mmol/L Final         Passed - Patient is not pregnant      Passed - Valid encounter within last 6 months    Recent Outpatient Visits           4 months ago Essential (primary) hypertension   [place] [facility] Malva Limes, MD   8 months ago Closed fracture of multiple ribs of right side, initial encounter   [facility] Malva Limes, MD   9 months ago Closed fracture of multiple ribs of right side, initial encounter   [facility] Malva Limes, MD   10 months ago Essential (primary) hypertension   [place] [facility] Malva Limes, MD   1 year ago Hyponatremia   [facility] Malva Limes, MD       Future Appointments             In 1 week Fisher, Demetrios Isaacs, MD [facility], [facility]

## 2022-07-30 ENCOUNTER — Ambulatory Visit (INDEPENDENT_AMBULATORY_CARE_PROVIDER_SITE_OTHER): Payer: Medicare Other | Admitting: Family Medicine

## 2022-07-30 VITALS — BP 119/65 | HR 79 | Ht 65.0 in | Wt 137.0 lb

## 2022-07-30 DIAGNOSIS — G8929 Other chronic pain: Secondary | ICD-10-CM

## 2022-07-30 DIAGNOSIS — K089 Disorder of teeth and supporting structures, unspecified: Secondary | ICD-10-CM | POA: Diagnosis not present

## 2022-07-30 DIAGNOSIS — I10 Essential (primary) hypertension: Secondary | ICD-10-CM | POA: Diagnosis not present

## 2022-07-30 DIAGNOSIS — H401190 Primary open-angle glaucoma, unspecified eye, stage unspecified: Secondary | ICD-10-CM | POA: Diagnosis not present

## 2022-07-30 MED ORDER — LIDOCAINE VISCOUS HCL 2 % MT SOLN: OROMUCOSAL | 5 refills | Status: DC

## 2022-07-30 NOTE — Patient Instructions (Signed)
Please review the attached list of medications and notify my office if there are any errors.   Try OTC Miralax every 1-2 days to keep bowels regular

## 2022-08-16 NOTE — Progress Notes (Signed)
Established patient visit   Patient: Joan Baldwin   DOB: 29-Jun-1945   77 y.o. Female  MRN: [id number] Visit Date: 07/30/2022  Today's healthcare provider: Mila Merry, MD   Chief Complaint  Patient presents with   Hypertension   Subjective    Discussed the use of AI scribe software for clinical note transcription with the patient, who gave verbal consent to proceed.  History of Present Illness   The patient presents for a routine checkup for her chronic medical problems. She describes herself as feeling "lazy," but denies any associated fatigue. The patient has been experiencing gum discomfort for the past month, requiring daily use of Orajel. She has previously used Lidocaine for similar symptoms, which had been effective, but has not used it for some time.  The patient's blood pressure has been well-controlled on Lisinopril 10mg , and she has been adhering to her prescribed medication regimen. She has been using Visine for dry eyes, but has not been using any prescribed eye drops for glaucoma or increased eye pressure.  The patient also reports infrequent bowel movements, occurring only once or twice a week, despite a good appetite. This is often accompanied by stomach cramps and spasms. She has been managing this with a stool softener and laxatives, but is seeking a more regular solution.  Lastly, the patient has a small abrasion on her elbow, which she attributes to bumping into something in the dark. She has been managing this with a Band-Aid.       Medications: Outpatient Medications Prior to Visit  Medication Sig   amLODipine (NORVASC) 5 MG tablet Take 1 tablet by mouth once daily   Calcium Carbonate-Vitamin D (CALCIUM 600/VITAMIN D PO) Take 1 tablet by mouth daily.    diazepam (VALIUM) 10 MG tablet TAKE 1/2 TO 1 (ONE-HALF TO ONE) TABLET BY MOUTH 4 TIMES DAILY AS NEEDED FOR ANXIETY   Fiber POWD Take 2 scoop by mouth daily.   FLUoxetine (PROZAC) 20 MG capsule TAKE  1 CAPSULE BY MOUTH ONCE DAILY TAKE  ALONG  WITH  40  MG  CAPSULE  DAILY   lactulose (CHRONULAC) 10 GM/15ML solution Take 45 mLs (30 g total) by mouth 3 (three) times daily.   latanoprost (XALATAN) 0.005 % ophthalmic solution Place 1 drop into both eyes at bedtime.   lidocaine (LIDODERM) 5 % Place 1 patch onto the skin daily. Remove & Discard patch within 12 hours or as directed by MD   lisinopril (ZESTRIL) 10 MG tablet Take 1 tablet (10 mg total) by mouth daily.   Multiple Vitamins-Minerals (CENTRUM SILVER) tablet    Omega-3 Fatty Acids (FISH OIL) 1000 MG CAPS Take 1 capsule by mouth daily.   omeprazole (PRILOSEC) 20 MG capsule Take 1 capsule by mouth once daily   QUEtiapine (SEROQUEL) 50 MG tablet TAKE 1 TABLET BY MOUTH AT BEDTIME   timolol (TIMOPTIC) 0.5 % ophthalmic solution    traZODone (DESYREL) 100 MG tablet TAKE 1 TABLET BY MOUTH AT BEDTIME   [DISCONTINUED] lidocaine (XYLOCAINE) 2 % solution USE AS DIRECTED IN MOUTH OR THROAT EVERY 4-6 HOURS AS NEEDED FOR MOUTH PAIN   No facility-administered medications prior to visit.   Review of Systems     Objective    BP 119/65 (BP Location: Left Arm, Patient Position: Sitting, Cuff Size: Normal)   Pulse 79   Ht 5\' 5"  (1.651 m)   Wt 137 lb (62.1 kg)   SpO2 98%   BMI 22.80 kg/m  Physical Exam  Physical Exam   CHEST: Lungs clear to auscultation. CARDIOVASCULAR: Heart sounds normal. SKIN: Abrasion on elbow, clean, dry with no erythea    No results found for any visits on 07/30/22.  Assessment & Plan     Assessment and Plan    Hypertension: Well controlled on Lisinopril 10mg  daily. -Continue Lisinopril 10mg  daily. and amlodipine 5 -Check blood pressure twice weekly at home.  Oral Pain: Recurrent gum pain managed with OTC Oragel. -Prescribe Lidocaine mouth rinse for use as needed.  Dry Eyes: Using OTC Visine for dryness. -Refer back to Dr. Druscilla Brownie for intraocular pressure check and management of potential  glaucoma.  Constipation: Infrequent bowel movements despite use of stool softeners and laxatives. -Trial of Miralax as needed for constipation.  Skin Abrasion: Noted on elbow, likely from minor trauma. -Advise to keep covered with a bandage.  - General Health Maintenance / Followup Plans   - She has history glaucoma and needs new rx for eye drops. referred back to ophthalmology for follow up  -Schedule follow-up appointment in 6 months (January 2025).       Return in about 6 months (around 01/30/2023).      Mila Merry, MD  [facility] [phone] (phone) [phone] (fax)  [facility]

## 2022-08-29 ENCOUNTER — Other Ambulatory Visit: Payer: Self-pay | Admitting: Family Medicine

## 2022-08-29 DIAGNOSIS — I10 Essential (primary) hypertension: Secondary | ICD-10-CM

## 2022-10-18 ENCOUNTER — Other Ambulatory Visit: Payer: Self-pay | Admitting: Family Medicine

## 2022-10-18 DIAGNOSIS — F419 Anxiety disorder, unspecified: Secondary | ICD-10-CM

## 2022-10-21 ENCOUNTER — Other Ambulatory Visit: Payer: Self-pay | Admitting: Family Medicine

## 2022-11-17 ENCOUNTER — Emergency Department
Admission: EM | Admit: 2022-11-17 | Discharge: 2022-11-17 | Disposition: A | Discharge status: Home / Self Care | Payer: Medicare Other | Attending: Emergency Medicine | Admitting: Emergency Medicine

## 2022-11-17 ENCOUNTER — Other Ambulatory Visit: Payer: Self-pay

## 2022-11-17 ENCOUNTER — Encounter: Payer: Self-pay | Admitting: Emergency Medicine

## 2022-11-17 ENCOUNTER — Emergency Department: Payer: Medicare Other

## 2022-11-17 DIAGNOSIS — S20212A Contusion of left front wall of thorax, initial encounter: Secondary | ICD-10-CM | POA: Diagnosis not present

## 2022-11-17 DIAGNOSIS — S299XXA Unspecified injury of thorax, initial encounter: Secondary | ICD-10-CM | POA: Diagnosis present

## 2022-11-17 DIAGNOSIS — W010XXA Fall on same level from slipping, tripping and stumbling without subsequent striking against object, initial encounter: Secondary | ICD-10-CM | POA: Diagnosis not present

## 2022-11-17 DIAGNOSIS — W19XXXA Unspecified fall, initial encounter: Secondary | ICD-10-CM

## 2022-11-17 DIAGNOSIS — B372 Candidiasis of skin and nail: Secondary | ICD-10-CM | POA: Diagnosis not present

## 2022-11-17 DIAGNOSIS — R0781 Pleurodynia: Secondary | ICD-10-CM | POA: Diagnosis not present

## 2022-11-17 DIAGNOSIS — L309 Dermatitis, unspecified: Secondary | ICD-10-CM | POA: Diagnosis not present

## 2022-11-17 DIAGNOSIS — S298XXA Other specified injuries of thorax, initial encounter: Secondary | ICD-10-CM

## 2022-11-17 MED ORDER — LIDOCAINE 5 % EX PTCH: 1.0000 | MEDICATED_PATCH | Freq: Two times a day (BID) | CUTANEOUS | 0 refills | Status: AC | PRN

## 2022-11-17 MED ORDER — OXYCODONE-ACETAMINOPHEN 5-325 MG PO TABS
1.0000 | ORAL_TABLET | Freq: Once | ORAL | Status: AC
  Administered 2022-11-17: 1 via ORAL
  Filled 2022-11-17: qty 1

## 2022-11-17 MED ORDER — LIDOCAINE 5 % EX PTCH
1.0000 | MEDICATED_PATCH | Freq: Once | CUTANEOUS | Status: DC
  Administered 2022-11-17: 1 via TRANSDERMAL
  Filled 2022-11-17: qty 1

## 2022-11-17 MED ORDER — HYDROCODONE-ACETAMINOPHEN 5-325 MG PO TABS: 1.0000 | ORAL_TABLET | Freq: Three times a day (TID) | ORAL | 0 refills | Status: AC | PRN

## 2022-11-17 NOTE — ED Triage Notes (Signed)
Pt slipped and fell and landed on coffee table on left side. Pt complains of pain around ribs and worsening pain with coughing. Denies LOC

## 2022-11-17 NOTE — ED Provider Notes (Signed)
[facility] Emergency Department Provider Note     Event Date/Time   First MD Initiated Contact with Patient 11/17/22 1536     (approximate)   History   Rib Injury   HPI  Joan Baldwin is a 77 y.o. female with a non-contributory medical history presents to the ED for evaluation of a mechanical fall. She reports she slipped and fell, landing on the coffee table, on her left chest. She denies head injury or LOC. She denies cough, hemoptysis, or central chest pain.    Physical Exam   Triage Vital Signs: ED Triage Vitals  Encounter Vitals Group     BP 11/17/22 1439 (!) 124/97     Systolic BP Percentile --      Diastolic BP Percentile --      Pulse Rate 11/17/22 1438 90     Resp 11/17/22 1438 18     Temp 11/17/22 1438 97.7 F (36.5 C)     Temp Source 11/17/22 1438 Oral     SpO2 11/17/22 1439 99 %     Weight 11/17/22 1438 132 lb (59.9 kg)     Height --      Head Circumference --      Peak Flow --      Pain Score 11/17/22 1438 10     Pain Loc --      Pain Education --      Exclude from Growth Chart --     Most recent vital signs: Vitals:   11/17/22 1438 11/17/22 1439  BP:  (!) 124/97  Pulse: 90   Resp: 18 18  Temp: 97.7 F (36.5 C)   SpO2:  99%    General Awake, no distress. NAD HEENT NCAT. PERRL. EOMI. No rhinorrhea. Mucous membranes are moist.  CV:  Good peripheral perfusion. RRR RESP:  Normal effort. CTA ABD:  No distention.  MSK:  Chest wall deformity or ecchymosis appreciated.  tenderness to palpation of the lateral left chest wall.  Patient with evidence of intertriginous yeast under the left breast   ED Results / Procedures / Treatments   Labs (all labs ordered are listed, but only abnormal results are displayed) Labs Reviewed - No data to display   EKG   RADIOLOGY  I personally viewed and evaluated these images as part of my medical decision making, as well as reviewing the written report by the  radiologist.  ED Provider Interpretation: no acute findings  DG Ribs Unilateral W/Chest Left  Result Date: 11/17/2022 CLINICAL DATA:  Left rib pain after fall. EXAM: LEFT RIBS AND CHEST - 3+ VIEW COMPARISON:  October 16, 2021. FINDINGS: No fracture or other bone lesions are seen involving the left ribs. There is no evidence of pneumothorax or pleural effusion. Both lungs are clear. Heart size and mediastinal contours are within normal limits. IMPRESSION: Normal left ribs.  Old right rib fractures are noted. Electronically Signed   By: Lupita Raider M.D.   On: 11/17/2022 16:52     PROCEDURES:  Critical Care performed: No  Procedures   MEDICATIONS ORDERED IN ED: Medications  lidocaine (LIDODERM) 5 % 1 patch (1 patch Transdermal Patch Applied 11/17/22 1638)  oxyCODONE-acetaminophen (PERCOCET/ROXICET) 5-325 MG per tablet 1 tablet (1 tablet Oral Given 11/17/22 1638)     IMPRESSION / MDM / ASSESSMENT AND PLAN / ED COURSE  I reviewed the triage vital signs and the nursing notes.  Differential diagnosis includes, but is not limited to, as well contusion, pneumothorax, rib fracture, muscle strain  Patient's presentation is most consistent with acute complicated illness / injury requiring diagnostic workup.  Patient's diagnosis is consistent with chest wall contusion status post mechanical fall.  No radiologic evidence of any acute fracture or dislocation based on my interpretation.  Patient will be discharged home with prescriptions for LidoDerm patches and hydrocodone. Patient is to follow up with her primary provider as discussed as needed or otherwise directed. Patient is given ED precautions to return to the ED for any worsening or new symptoms.   FINAL CLINICAL IMPRESSION(S) / ED DIAGNOSES   Final diagnoses:  Fall, initial encounter  Rib contusion, left, initial encounter  Yeast dermatitis     Rx / DC Orders   ED Discharge Orders          Ordered     lidocaine (LIDODERM) 5 %  Every 12 hours PRN        11/17/22 1650    HYDROcodone-acetaminophen (NORCO) 5-325 MG tablet  3 times daily PRN        11/17/22 1650             Note:  This document was prepared using Dragon voice recognition software and may include unintentional dictation errors.    Lissa Hoard, PA-C 11/17/22 2331    Concha Se, MD 11/18/22 [phone]

## 2022-11-17 NOTE — Discharge Instructions (Addendum)
Joan Baldwin has a normal exam showing what appears to be a rib fracture. Give the pain medicine as needed and use the Lidoderm patches every 12 hours as directed. You also have a yeast infection of the skin. Use an OTC anti-fungal cream or spray as discussed.

## 2022-11-27 ENCOUNTER — Other Ambulatory Visit: Payer: Self-pay | Admitting: Family Medicine

## 2022-11-30 ENCOUNTER — Other Ambulatory Visit: Payer: Self-pay | Admitting: Family Medicine

## 2022-12-01 ENCOUNTER — Telehealth: Payer: Self-pay

## 2022-12-01 NOTE — Telephone Encounter (Signed)
Transition Care Management Unsuccessful Follow-up Telephone Call  Date of discharge and from where:  11/17/2022 [facility]  Attempts:  1st Attempt  Reason for unsuccessful TCM follow-up call:  Unable to reach patient  Kimori Tartaglia [facility]  [facility], [facility] Guide Direct Dial: [phone]  Website: [email or web address]

## 2022-12-02 ENCOUNTER — Telehealth: Payer: Self-pay

## 2022-12-02 NOTE — Telephone Encounter (Signed)
Transition Care Management Unsuccessful Follow-up Telephone Call  Date of discharge and from where:  11/17/2022 [facility]  Attempts:  2nd Attempt  Reason for unsuccessful TCM follow-up call:  Unable to reach patient  Tadhg Eskew [facility]  [facility], [facility] Guide Direct Dial: [phone]  Website: [email or web address]

## 2022-12-15 ENCOUNTER — Other Ambulatory Visit: Payer: Self-pay | Admitting: Family Medicine

## 2022-12-15 DIAGNOSIS — F419 Anxiety disorder, unspecified: Secondary | ICD-10-CM

## 2022-12-17 NOTE — Telephone Encounter (Signed)
Requested medication (s) are due for refill today: Yes  Requested medication (s) are on the active medication list: Yes  Last refill:  10/19/22  Future visit scheduled: Yes  Notes to clinic:  Not delegated.    Requested Prescriptions  Pending Prescriptions Disp Refills   diazepam (VALIUM) 10 MG tablet [Pharmacy Med Name: diazePAM 10 MG Oral Tablet] 120 tablet 0    Sig: TAKE 1/2 TO 1 (ONE-HALF TO ONE) TABLET BY MOUTH 4 TIMES DAILY AS NEEDED FOR ANXIETY     Not Delegated - Psychiatry: Anxiolytics/Hypnotics 2 Failed - 12/15/2022  9:43 AM      Failed - This refill cannot be delegated      Failed - Urine Drug Screen completed in last 360 days      Passed - Patient is not pregnant      Passed - Valid encounter within last 6 months    Recent Outpatient Visits           4 months ago Essential (primary) hypertension   [place] [facility] Malva Limes, MD   9 months ago Essential (primary) hypertension   [place] [facility] Malva Limes, MD   1 year ago Closed fracture of multiple ribs of right side, initial encounter   [place] [facility] Malva Limes, MD   1 year ago Closed fracture of multiple ribs of right side, initial encounter   [facility] Malva Limes, MD   1 year ago Essential (primary) hypertension   [place] [facility] Malva Limes, MD       Future Appointments             In 1 month Fisher, Demetrios Isaacs, MD [facility], [facility]

## 2022-12-17 NOTE — Telephone Encounter (Signed)
Please advise 

## 2023-01-14 ENCOUNTER — Other Ambulatory Visit: Payer: Self-pay | Admitting: Family Medicine

## 2023-01-31 ENCOUNTER — Encounter: Payer: Self-pay | Admitting: Family Medicine

## 2023-01-31 ENCOUNTER — Ambulatory Visit (INDEPENDENT_AMBULATORY_CARE_PROVIDER_SITE_OTHER): Payer: Medicare Other | Admitting: Family Medicine

## 2023-01-31 VITALS — BP 133/59 | HR 83 | Temp 98.2°F | Ht 65.0 in | Wt 128.5 lb

## 2023-01-31 DIAGNOSIS — M810 Age-related osteoporosis without current pathological fracture: Secondary | ICD-10-CM | POA: Diagnosis not present

## 2023-01-31 DIAGNOSIS — R7303 Prediabetes: Secondary | ICD-10-CM

## 2023-01-31 DIAGNOSIS — I10 Essential (primary) hypertension: Secondary | ICD-10-CM

## 2023-01-31 DIAGNOSIS — F339 Major depressive disorder, recurrent, unspecified: Secondary | ICD-10-CM

## 2023-01-31 DIAGNOSIS — G8929 Other chronic pain: Secondary | ICD-10-CM | POA: Diagnosis not present

## 2023-01-31 DIAGNOSIS — K089 Disorder of teeth and supporting structures, unspecified: Secondary | ICD-10-CM | POA: Diagnosis not present

## 2023-01-31 DIAGNOSIS — Z23 Encounter for immunization: Secondary | ICD-10-CM | POA: Diagnosis not present

## 2023-02-01 NOTE — Progress Notes (Signed)
Established patient visit   Patient: Joan Baldwin   DOB: [birth year]   78 y.o. Female  MRN: [id number] Visit Date: 01/31/2023  Today's healthcare provider: Mila Merry, MD   Chief Complaint  Patient presents with   Hypertension   Prediabetes   Subjective    Discussed the use of AI scribe software for clinical note transcription with the patient, who gave verbal consent to proceed.  History of Present Illness   The patient, with a history of hypertension, anxiety, depression, prediabetes, and osteoporosis previously on Fosamax, presented for a routine checkup. She has been on bisphosphonate holiday since Mary 2023. She has been maintaining an active lifestyle, engaging in regular walking exercises both indoors and outdoors.  The patient has been experiencing no trouble with breathing or shortness of breath. She also denied any swelling in her hands or feet.     Lab Results  Component Value Date   HGBA1C 6.1 (H) 02/10/2021   Lab Results  Component Value Date   NA 148 (H) 03/24/2022   K 3.8 03/24/2022   CREATININE 0.79 03/24/2022   EGFR 76 03/24/2022   GLUCOSE 108 (H) 03/24/2022     Medications: Outpatient Medications Prior to Visit  Medication Sig   amLODipine (NORVASC) 5 MG tablet Take 1 tablet by mouth once daily   Calcium Carbonate-Vitamin D (CALCIUM 600/VITAMIN D PO) Take 1 tablet by mouth daily.    diazepam (VALIUM) 10 MG tablet TAKE 1/2 TO 1 (ONE-HALF TO ONE) TABLET BY MOUTH 4 TIMES DAILY AS NEEDED FOR ANXIETY   Fiber POWD Take 2 scoop by mouth daily.   FLUoxetine (PROZAC) 20 MG capsule TAKE 1 CAPSULE BY MOUTH ONCE DAILY ALONG  WITH  40MG   CAPSULE  DAILY   lactulose (CHRONULAC) 10 GM/15ML solution Take 45 mLs (30 g total) by mouth 3 (three) times daily.   latanoprost (XALATAN) 0.005 % ophthalmic solution Place 1 drop into both eyes at bedtime.   lidocaine (XYLOCAINE) 2 % solution USE AS DIRECTED IN MOUTH OR THROAT EVERY 4-6 HOURS AS NEEDED FOR MOUTH  PAIN   lisinopril (ZESTRIL) 10 MG tablet Take 1 tablet (10 mg total) by mouth daily.   Multiple Vitamins-Minerals (CENTRUM SILVER) tablet    Omega-3 Fatty Acids (FISH OIL) 1000 MG CAPS Take 1 capsule by mouth daily.   omeprazole (PRILOSEC) 20 MG capsule Take 1 capsule by mouth once daily   QUEtiapine (SEROQUEL) 50 MG tablet TAKE 1 TABLET BY MOUTH AT BEDTIME   timolol (TIMOPTIC) 0.5 % ophthalmic solution    traZODone (DESYREL) 100 MG tablet TAKE 1 TABLET BY MOUTH AT BEDTIME   No facility-administered medications prior to visit.   (optional):1}   Objective    BP (!) 133/59   Pulse 83   Temp 98.2 F (36.8 C) (Oral)   Ht 5\' 5"  (1.651 m)   Wt 128 lb 8 oz (58.3 kg)   BMI 21.38 kg/m    Physical Exam   CHEST: Lungs clear to auscultation. CARDIOVASCULAR: Heart sounds normal on auscultation. EXTREMITIES: No edema in hands or feet.      Assessment & Plan      1. Essential (primary) hypertension (Primary) Well controlled.  Continue current medications.    2. Osteoporosis, unspecified osteoporosis type, unspecified pathological fracture presence On bisphosphonate holiday since May 2023 - DG Bone Density; Future  3. Chronic dental pain Fairly well controlled,   4. MDD (major depressive disorder), recurrent episode, with atypical features (HCC) Stable on  current psychotropic medications.   5. Pre-diabetes Well controlled with diet. Check A1c with next lab draw  6. Need for influenza vaccination  - Flu Vaccine Trivalent High Dose Santiago Glad)   Future Appointments  Date Time Provider Department Center  06/22/2023  1:00 PM [facility]-ANNUAL WELLNESS VISIT [facility]       Mila Merry, MD  [facility] [phone] (phone) [phone] (fax)  [facility]

## 2023-02-03 ENCOUNTER — Other Ambulatory Visit: Payer: Self-pay | Admitting: Family Medicine

## 2023-02-03 DIAGNOSIS — G4700 Insomnia, unspecified: Secondary | ICD-10-CM

## 2023-02-10 ENCOUNTER — Other Ambulatory Visit: Payer: Self-pay | Admitting: Family Medicine

## 2023-02-10 DIAGNOSIS — F419 Anxiety disorder, unspecified: Secondary | ICD-10-CM

## 2023-02-11 NOTE — Telephone Encounter (Signed)
Requested medication (s) are due for refill today- yes  Requested medication (s) are on the active medication list -yes  Future visit scheduled -no  Last refill: 12/17/22 #120 1RF  Notes to clinic: non delegated Rx  Requested Prescriptions  Pending Prescriptions Disp Refills   diazepam (VALIUM) 10 MG tablet [Pharmacy Med Name: diazePAM 10 MG Oral Tablet] 120 tablet 0    Sig: TAKE 1/2 TO 1 (ONE-HALF TO ONE) TABLET BY MOUTH 4 TIMES DAILY AS NEEDED FOR ANXIETY     Not Delegated - Psychiatry: Anxiolytics/Hypnotics 2 Failed - 02/11/2023  9:05 AM      Failed - This refill cannot be delegated      Failed - Urine Drug Screen completed in last 360 days      Passed - Patient is not pregnant      Passed - Valid encounter within last 6 months    Recent Outpatient Visits           1 week ago Essential (primary) hypertension   [place] [facility] Malva Limes, MD   6 months ago Essential (primary) hypertension   [place] [facility] Malva Limes, MD   10 months ago Essential (primary) hypertension   [facility] Malva Limes, MD   1 year ago Closed fracture of multiple ribs of right side, initial encounter   [facility] Malva Limes, MD   1 year ago Closed fracture of multiple ribs of right side, initial encounter   [facility] Malva Limes, MD                 Requested Prescriptions  Pending Prescriptions Disp Refills   diazepam (VALIUM) 10 MG tablet [Pharmacy Med Name: diazePAM 10 MG Oral Tablet] 120 tablet 0    Sig: TAKE 1/2 TO 1 (ONE-HALF TO ONE) TABLET BY MOUTH 4 TIMES DAILY AS NEEDED FOR ANXIETY     Not Delegated - Psychiatry: Anxiolytics/Hypnotics 2 Failed - 02/11/2023  9:05 AM      Failed - This refill cannot be delegated      Failed - Urine Drug Screen completed in last 360 days      Passed - Patient is not pregnant      Passed -  Valid encounter within last 6 months    Recent Outpatient Visits           1 week ago Essential (primary) hypertension   [place] [facility] Malva Limes, MD   6 months ago Essential (primary) hypertension   [place] [facility] Malva Limes, MD   10 months ago Essential (primary) hypertension   [facility] Malva Limes, MD   1 year ago Closed fracture of multiple ribs of right side, initial encounter   [facility] Malva Limes, MD   1 year ago Closed fracture of multiple ribs of right side, initial encounter   [facility] Malva Limes, MD

## 2023-02-24 ENCOUNTER — Other Ambulatory Visit: Payer: Self-pay | Admitting: Family Medicine

## 2023-02-24 DIAGNOSIS — I10 Essential (primary) hypertension: Secondary | ICD-10-CM

## 2023-02-24 NOTE — Telephone Encounter (Signed)
Requested Prescriptions  Pending Prescriptions Disp Refills   amLODipine (NORVASC) 5 MG tablet [Pharmacy Med Name: amLODIPine Besylate 5 MG Oral Tablet] 90 tablet 0    Sig: Take 1 tablet by mouth once daily     Cardiovascular: Calcium Channel Blockers 2 Passed - 02/24/2023  3:37 PM      Passed - Last BP in normal range    BP Readings from Last 1 Encounters:  01/31/23 (!) 133/59         Passed - Last Heart Rate in normal range    Pulse Readings from Last 1 Encounters:  01/31/23 83         Passed - Valid encounter within last 6 months    Recent Outpatient Visits           3 weeks ago Essential (primary) hypertension   [place] [facility] Malva Limes, MD   6 months ago Essential (primary) hypertension   [place] [facility] Malva Limes, MD   11 months ago Essential (primary) hypertension   [place] [facility] Malva Limes, MD   1 year ago Closed fracture of multiple ribs of right side, initial encounter   [facility] Malva Limes, MD   1 year ago Closed fracture of multiple ribs of right side, initial encounter   [place] [facility] Malva Limes, MD               FLUoxetine (PROZAC) 20 MG capsule [Pharmacy Med Name: FLUoxetine HCl 20 MG Oral Capsule] 90 capsule 0    Sig: TAKE 1 CAPSULE BY MOUTH ONCE DAILY ALONG  WITH  40  MG  CAPSULES  DAILY     Psychiatry:  Antidepressants - SSRI Passed - 02/24/2023  3:37 PM      Passed - Completed PHQ-2 or PHQ-9 in the last 360 days      Passed - Valid encounter within last 6 months    Recent Outpatient Visits           3 weeks ago Essential (primary) hypertension   [place] [facility] Malva Limes, MD   6 months ago Essential (primary) hypertension   [place] [facility] Malva Limes, MD   11 months ago Essential (primary) hypertension   [facility] Malva Limes, MD   1 year ago Closed fracture of multiple ribs of right side, initial encounter   [facility] Malva Limes, MD   1 year ago Closed fracture of multiple ribs of right side, initial encounter   [facility] Malva Limes, MD

## 2023-03-15 ENCOUNTER — Telehealth: Payer: Self-pay

## 2023-03-15 NOTE — Telephone Encounter (Unsigned)
 Copied from CRM [id number]. Topic: Appointments - Scheduling Inquiry for Clinic >> Mar 14, 2023 11:50 AM Maxwell Marion wrote: Reason for CRM: Patient is needing a bone density test scheduled, please call Joan Baldwin to schedule her appointment because she handles that for the patient and is on DPR, her call back number [phone]

## 2023-05-02 ENCOUNTER — Ambulatory Visit
Admission: RE | Admit: 2023-05-02 | Discharge: 2023-05-02 | Disposition: A | Discharge status: Home / Self Care | Source: Ambulatory Visit | Attending: Family Medicine | Admitting: Family Medicine

## 2023-05-02 DIAGNOSIS — M810 Age-related osteoporosis without current pathological fracture: Secondary | ICD-10-CM | POA: Insufficient documentation

## 2023-05-02 DIAGNOSIS — Z780 Asymptomatic menopausal state: Secondary | ICD-10-CM | POA: Diagnosis not present

## 2023-05-07 ENCOUNTER — Other Ambulatory Visit: Payer: Self-pay | Admitting: Family Medicine

## 2023-05-07 DIAGNOSIS — F419 Anxiety disorder, unspecified: Secondary | ICD-10-CM

## 2023-05-10 ENCOUNTER — Telehealth: Payer: Self-pay

## 2023-05-10 NOTE — Telephone Encounter (Signed)
 Copied from CRM [id number]. Topic: Clinical - Lab/Test Results >> May 10, 2023  2:09 PM Ethelle Herb L wrote: Reason for CRM: Pt and DIL requesting results of bone density test.   DIL, Moira Andrews requesting call back: [phone]

## 2023-05-11 ENCOUNTER — Other Ambulatory Visit: Payer: Self-pay | Admitting: Family Medicine

## 2023-05-11 DIAGNOSIS — M810 Age-related osteoporosis without current pathological fracture: Secondary | ICD-10-CM

## 2023-05-11 MED ORDER — ALENDRONATE SODIUM 70 MG PO TABS: ORAL_TABLET | ORAL | 4 refills | Status: AC

## 2023-05-12 NOTE — Telephone Encounter (Signed)
 See result note.

## 2023-05-19 ENCOUNTER — Other Ambulatory Visit: Payer: Self-pay | Admitting: Family Medicine

## 2023-05-22 ENCOUNTER — Other Ambulatory Visit: Payer: Self-pay | Admitting: Family Medicine

## 2023-05-22 DIAGNOSIS — I10 Essential (primary) hypertension: Secondary | ICD-10-CM

## 2023-05-24 ENCOUNTER — Other Ambulatory Visit: Payer: Self-pay | Admitting: Family Medicine

## 2023-05-24 DIAGNOSIS — I10 Essential (primary) hypertension: Secondary | ICD-10-CM

## 2023-06-04 DIAGNOSIS — D492 Neoplasm of unspecified behavior of bone, soft tissue, and skin: Secondary | ICD-10-CM | POA: Diagnosis not present

## 2023-06-06 ENCOUNTER — Encounter: Payer: Self-pay | Admitting: Family Medicine

## 2023-06-07 ENCOUNTER — Telehealth: Payer: Self-pay

## 2023-06-07 ENCOUNTER — Ambulatory Visit: Payer: Self-pay

## 2023-06-07 NOTE — Telephone Encounter (Signed)
 Patient called, left VM to return the call to the office to speak to NT.     Copied From CRM [id number]. Reason for Triage: Pt has a skin concern, needs  to be seen for a dermatology referral. Was seen  at Urgent Care in [facility]. Nothing available  with PCP soon enough. Also concerned about  early stages of dementia.  Best contact: [phone]

## 2023-06-07 NOTE — Telephone Encounter (Signed)
 This was the next message in the inbox   Copied from CRM [id number]. Topic: Appointments - Scheduling Inquiry for Clinic >> Jun 07, 2023 12:07 PM Everette C wrote: Reason for CRM: The patient's family member would like to be contacted by a member of clinical staff to discuss further options for a same day appointment for the patient or an appointment within the next 14 days   Please contact further if/when possible

## 2023-06-07 NOTE — Telephone Encounter (Signed)
 Patient has appt on 06/10/23 with Dr .Shann Darnel

## 2023-06-07 NOTE — Telephone Encounter (Signed)
 I am not sure who the Trevor Fudge is that she is talking about so I thought you might could help her. Thanks   Copied from CRM [id number]. Topic: Appointments - Scheduling Inquiry for Clinic >> Jun 07, 2023  1:49 PM Baldemar Lev wrote: Reason for CRM: Pt's daughter called requesting to have the AWV in office instead of via telephone. She has questions and wants to speak to [place].   Best contact: [phone]

## 2023-06-07 NOTE — Telephone Encounter (Signed)
 Chief Complaint: skin growth; memory problems Symptoms: right elbow skin growth with pain on palpation, memory problems Frequency: x months Pertinent Negatives: Patient's daughter in law denies disorientation, unilateral numbness or weakness, facial droop, changes in speech or vision, nausea, vomiting diarrhea Disposition: [] ED /[] Urgent Care (no appt availability in office) / [] Appointment(In office/virtual)/ []  [place] [facility]/ [] Home Care/ [] Refused Recommended Disposition /[] [place] [facility]/ [x]  Follow-up with PCP Additional Notes: Daughter in law, Moira Andrews, calling in for triage. She states she took patient to urgent care over the weekend for skin concern on right elbow. Picture in chart sent via mychart. She states the patient reports she has had the growth for a year, but she doubts it has been going on longer than a few months but is unsure. She also mentioned she is concerned the patient has early dementia or memory problems. She states patient is alert and oriented but asks the same repeated questions. She states the primary concern is the skin growth as they were told to get in with PCP as soon as possible to get an "emergent dermatology referral".  Summary: Needs dermatology referral. No appt soon enough   Copied From CRM [id number]. Reason for Triage: Pt has a skin concern, needs to be seen for a dermatology referral. Was seen at Urgent Care in [place]. Nothing available with PCP soon enough. Also concerned about early stages of dementia.  Best contact: [phone]         Reason for Disposition  [1] Acting confused (e.g., disoriented, slurred speech) AND [2] brief (now gone)  [1] Skin growth or mole AND [2] changes color, or has more than one color  Answer Assessment - Initial Assessment Questions 1. LEVEL OF CONSCIOUSNESS: "How is he (she, the patient) acting right now?" (e.g., alert-oriented, confused, lethargic, stuporous, comatose)     Awake, alert, oriented.  She repeats/asks the same questions repeatedly and asks questions to things she knows the answer to.  2. ONSET: "When did the confusion start?"  (minutes, hours, days)     3-4 months, she states  3. PATTERN "Does this come and go, or has it been constant since it started?"  "Is it present now?"     Comes and goes, she states when the patient was talking to her brother on the phone she is normal/baseline.  4. ALCOHOL or DRUGS: "Has he been drinking alcohol or taking any drugs?"      No.  5. NARCOTIC MEDICINES: "Has he been receiving any narcotic medications?" (e.g., morphine , Vicodin)     Valium .  6. CAUSE: "What do you think is causing the confusion?"      Daughter in law thinks it could be early dementia and also states she thins the patient could be doing it on purpose.  7. OTHER SYMPTOMS: "Are there any other symptoms?" (e.g., difficulty breathing, headache, fever, weakness)     No.  Answer Assessment - Initial Assessment Questions 1. MAIN CONCERN OR SYMPTOM:  "What's the main symptom you're worried about?" (e.g., itching, skin rash, redness). If there is a rash: "Please describe it" (e.g., blisters, redness, sores; location, widespread)      Right elbow skin growth, was told by urgent care she needs an emergent dermatology referral for possible skin cancer.  2. ONSET: "When did the  skin growth  start?"     Unsure, thinks it may be a few months.  3. NEW-BETTER-SAME-WORSE: "Is this new since your last doctor (or NP/PA) visit?" If not new, "Are you  getting better, staying the same, or getting worse compared to how you felt at your last visit to the doctor?"     Worse.  4. PAIN: "Is there any pain?" If Yes, ask: "How bad is it?"  (Scale 1-10; or mild, moderate, severe)     Only painful when touching it.  5. FEVER: "Do you have a fever?" If Yes, ask: "What is it, how was it measured and when did it start?"     No.  6. CAUSE: "What do you think is causing the rash?"     Unsure,  was concerned for skin cancer.  7. NEW MEDICINE: "What new medicine are you taking?" (e.g., name of antibiotic) "When did you start taking this medication?"     No.  8. CANCER: "What type of cancer do you have?"     Not diagnosed, concerned it could be cancer.  9. CANCER - TREATMENT: "What cancer treatments have you received?" "When did you last take or receive them?" (e.g., recent surgery, radiation, immunotherapy, bone marrow transplant, or chemotherapy). Note: If triager has access to the patient's medical record, triager should review treatments and administration dates. If the patient has had a bone marrow transplant, determine if the patient is on immunosuppressant medicines.     None.  10. OTHER SYMPTOMS: "Do you have any other symptoms?" (e.g., face swelling, fever, itching, joint pain, sore throat)       Red and black discoloration to skin growth.  11. PREGNANCY: "Is there any chance you are pregnant?" "When was your last menstrual period?"       N/A.  Protocols used: Confusion - Delirium-A-AH, Cancer - Skin Symptoms and Questions-A-AH, Skin Lesion - Moles or Growths-A-AH

## 2023-06-08 DIAGNOSIS — D492 Neoplasm of unspecified behavior of bone, soft tissue, and skin: Secondary | ICD-10-CM | POA: Diagnosis not present

## 2023-06-08 DIAGNOSIS — C44622 Squamous cell carcinoma of skin of right upper limb, including shoulder: Secondary | ICD-10-CM | POA: Diagnosis not present

## 2023-06-10 ENCOUNTER — Ambulatory Visit: Admitting: Family Medicine

## 2023-06-17 ENCOUNTER — Encounter: Payer: Self-pay | Admitting: Family Medicine

## 2023-06-17 ENCOUNTER — Ambulatory Visit (INDEPENDENT_AMBULATORY_CARE_PROVIDER_SITE_OTHER): Admitting: Family Medicine

## 2023-06-17 VITALS — BP 130/62 | HR 80 | Wt 138.0 lb

## 2023-06-17 DIAGNOSIS — R7303 Prediabetes: Secondary | ICD-10-CM | POA: Diagnosis not present

## 2023-06-17 DIAGNOSIS — C44622 Squamous cell carcinoma of skin of right upper limb, including shoulder: Secondary | ICD-10-CM

## 2023-06-17 DIAGNOSIS — F339 Major depressive disorder, recurrent, unspecified: Secondary | ICD-10-CM

## 2023-06-17 DIAGNOSIS — B351 Tinea unguium: Secondary | ICD-10-CM | POA: Diagnosis not present

## 2023-06-17 DIAGNOSIS — I10 Essential (primary) hypertension: Secondary | ICD-10-CM

## 2023-06-17 MED ORDER — TERBINAFINE HCL 250 MG PO TABS: 250.0000 mg | ORAL_TABLET | Freq: Every day | ORAL | 0 refills | Status: DC

## 2023-06-17 NOTE — Progress Notes (Signed)
      Established patient visit   Patient: Joan Baldwin   DOB: [birth year]   78 y.o. Female  MRN: [id number] Visit Date: 06/17/2023  Today's healthcare provider: Jeralene Mom, MD    Subjective    HPI  Presents for follow up of hypertension and also reporting yellow, thickened dysmorphic toenails that she would like treated. She has also recently had bx of large cutaneous mass on right arm found to be SCC. Will be following up at [facility] dermatology for treatment.   Medications: Outpatient Medications Prior to Visit  Medication Sig   alendronate  (FOSAMAX ) 70 MG tablet TAKE 1 TABLET BY MOUTH EVERY 7 DAYS. TAKE WITH A FULL GLASS OF WATER ON EMPTY STOMACH   amLODipine  (NORVASC ) 5 MG tablet Take 1 tablet by mouth once daily   Calcium  Carbonate-Vitamin D  (CALCIUM  600/VITAMIN D  PO) Take 1 tablet by mouth daily.    diazepam  (VALIUM ) 10 MG tablet TAKE 0.5-1 TABLET BY MOUTH 4 TIMES DAILY AS NEEDED FOR ANXIETY   Fiber POWD Take 2 scoop by mouth daily.   FLUoxetine  (PROZAC ) 20 MG capsule TAKE 1 CAPSULE BY MOUTH ONCE DAILY ALONG  WITH  40MG   CAPSULES  DAILY   lactulose  (CHRONULAC ) 10 GM/15ML solution Take 45 mLs (30 g total) by mouth 3 (three) times daily.   latanoprost  (XALATAN ) 0.005 % ophthalmic solution Place 1 drop into both eyes at bedtime.   lidocaine  (XYLOCAINE ) 2 % solution USE AS DIRECTED IN MOUTH OR THROAT EVERY 4-6 HOURS AS NEEDED FOR MOUTH PAIN   lisinopril  (ZESTRIL ) 10 MG tablet Take 1 tablet (10 mg total) by mouth daily.   Multiple Vitamins-Minerals (CENTRUM SILVER) tablet    Omega-3 Fatty Acids (FISH OIL) 1000 MG CAPS Take 1 capsule by mouth daily.   omeprazole  (PRILOSEC) 20 MG capsule Take 1 capsule by mouth once daily   QUEtiapine  (SEROQUEL ) 50 MG tablet TAKE 1 TABLET BY MOUTH AT BEDTIME   timolol  (TIMOPTIC ) 0.5 % ophthalmic solution    traZODone  (DESYREL ) 100 MG tablet TAKE 1 TABLET BY MOUTH AT BEDTIME   No facility-administered medications prior to visit.    Review  of Systems     Objective    BP 130/62 (BP Location: Left Arm, Patient Position: Sitting, Cuff Size: Normal)   Pulse 80   Wt 138 lb (62.6 kg)   SpO2 97%   BMI 22.96 kg/m    Physical Exam   Extensive changes of onycomycosis of toenails of both feet.    Assessment & Plan     1. Onychomycosis (Primary) - terbinafine  (LAMISIL ) 250 MG tablet; Take 1 tablet (250 mg total) by mouth daily.  Dispense: 30 tablet; Refill: 0   Check liver functions in 3-4 weeks before continuing therapy.   2. 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   3. Essential (primary) hypertension Due for yearly labs which will plan on doing along hepatic panel for Lamisil  treatment in 3-4 weeks.   4. Prediabetes Check A1c with labs in 3-4 weeks.       Jeralene Mom, MD  [facility] [phone] (phone) [phone] (fax)  [facility]

## 2023-06-22 ENCOUNTER — Ambulatory Visit: Payer: Medicare Other

## 2023-06-22 DIAGNOSIS — Z0000 Encounter for general adult medical examination without abnormal findings: Secondary | ICD-10-CM | POA: Diagnosis not present

## 2023-06-22 NOTE — Progress Notes (Signed)
 Subjective:   Joan Baldwin is a 78 y.o. who presents for a Medicare Wellness preventive visit.  As a reminder, Annual Wellness Visits don't include a physical exam, and some assessments may be limited, especially if this visit is performed virtually. We may recommend an in-person follow-up visit with your provider if needed.  Visit Complete: Virtual I connected with  Kimya S Aubuchon on 06/22/23 by a audio enabled telemedicine application and verified that I am speaking with the correct person using two identifiers.  Patient Location: Home  Provider Location: Home Office  I discussed the limitations of evaluation and management by telemedicine. The patient expressed understanding and agreed to proceed.  Vital Signs: Because this visit was a virtual/telehealth visit, some criteria may be missing or patient reported. Any vitals not documented were not able to be obtained and vitals that have been documented are patient reported.  VideoDeclined- This patient declined Librarian, academic. Therefore the visit was completed with audio only.  Persons Participating in Visit: Patient assisted by daughter-in-law.  AWV Questionnaire: No: Patient Medicare AWV questionnaire was not completed prior to this visit.  Cardiac Risk Factors include: smoking/ tobacco exposure     Objective:     There were no vitals filed for this visit. There is no height or weight on file to calculate BMI.     06/22/2023    1:25 PM 11/17/2022    2:41 PM 06/21/2022    1:22 PM 02/14/2022    7:54 PM 10/05/2021    9:05 PM 06/16/2021    1:31 PM 06/11/2021    8:42 PM  Advanced Directives  Does Patient Have a Medical Advance Directive? Yes No Yes No No No Yes  Type of Estate agent of [place];Living will  Healthcare Power of Attorney    Living will  Does patient want to make changes to medical advance directive? No - Patient declined     Yes (ED - Information included  in AVS)   Copy of Healthcare Power of Attorney in Chart? Yes - validated most recent copy scanned in chart (See row information)        Would patient like information on creating a medical advance directive?  No - Patient declined    No - Patient declined No - Patient declined    Current Medications (verified) Outpatient Encounter Medications as of 06/22/2023  Medication Sig   alendronate  (FOSAMAX ) 70 MG tablet TAKE 1 TABLET BY MOUTH EVERY 7 DAYS. TAKE WITH A FULL GLASS OF WATER ON EMPTY STOMACH   amLODipine  (NORVASC ) 5 MG tablet Take 1 tablet by mouth once daily   Calcium  Carbonate-Vitamin D  (CALCIUM  600/VITAMIN D  PO) Take 1 tablet by mouth daily.    diazepam  (VALIUM ) 10 MG tablet TAKE 0.5-1 TABLET BY MOUTH 4 TIMES DAILY AS NEEDED FOR ANXIETY   FLUoxetine  (PROZAC ) 20 MG capsule TAKE 1 CAPSULE BY MOUTH ONCE DAILY ALONG  WITH  40MG   CAPSULES  DAILY   lactulose  (CHRONULAC ) 10 GM/15ML solution Take 45 mLs (30 g total) by mouth 3 (three) times daily.   lidocaine  (XYLOCAINE ) 2 % solution USE AS DIRECTED IN MOUTH OR THROAT EVERY 4-6 HOURS AS NEEDED FOR MOUTH PAIN   lisinopril  (ZESTRIL ) 10 MG tablet Take 1 tablet (10 mg total) by mouth daily.   Multiple Vitamins-Minerals (CENTRUM SILVER) tablet    omeprazole  (PRILOSEC) 20 MG capsule Take 1 capsule by mouth once daily   QUEtiapine  (SEROQUEL ) 50 MG tablet TAKE 1 TABLET BY  MOUTH AT BEDTIME   terbinafine  (LAMISIL ) 250 MG tablet Take 1 tablet (250 mg total) by mouth daily.   traZODone  (DESYREL ) 100 MG tablet TAKE 1 TABLET BY MOUTH AT BEDTIME   Fiber POWD Take 2 scoop by mouth daily. (Patient not taking: Reported on 06/22/2023)   latanoprost  (XALATAN ) 0.005 % ophthalmic solution Place 1 drop into both eyes at bedtime. (Patient not taking: Reported on 06/22/2023)   Omega-3 Fatty Acids (FISH OIL) 1000 MG CAPS Take 1 capsule by mouth daily. (Patient not taking: Reported on 06/22/2023)   timolol  (TIMOPTIC ) 0.5 % ophthalmic solution  (Patient not taking:  Reported on 06/22/2023)   No facility-administered encounter medications on file as of 06/22/2023.    Allergies (verified) Gabapentin, Oxcarbazepine, Fluoxetine , Tramadol hcl, Ultram  [tramadol hcl], and Codeine   History: Past Medical History:  Diagnosis Date   Anxiety    can cause palpitations   Balance problems    had brain scan (10/02/19) / clearance given by Dr Shann Darnel   Cataract    Complication of anesthesia    patient state, they have to give me alot to go out during colonoscopy   Depression    GERD (gastroesophageal reflux disease)    Glaucoma    Hepatitis    B w treatment/ been cleared (Dr Evans Him records)   History of chicken pox    Hypertension    controlled on meds   Osteoporosis    Sepsis (HCC) 01/17/2016   Wears dentures    upper   Past Surgical History:  Procedure Laterality Date   ABDOMINAL HYSTERECTOMY     vaginal. Still has both ovaries   back surgeries     x 2   CATARACT EXTRACTION W/PHACO Left 10/09/2019   Procedure: CATARACT EXTRACTION PHACO AND INTRAOCULAR LENS PLACEMENT (IOC) LEFT;  Surgeon: Clair Crews, MD;  Location: [facility];  Service: Ophthalmology;  Laterality: Left;  6.16 0:46.0   CATARACT EXTRACTION W/PHACO Right 10/30/2019   Procedure: CATARACT EXTRACTION PHACO AND INTRAOCULAR LENS PLACEMENT (IOC) RIGHT 6.31 00:46.1;  Surgeon: Clair Crews, MD;  Location: [facility];  Service: Ophthalmology;  Laterality: Right;   COLONOSCOPY  2007   done in [place] per patient. Normal except for moderate hemorrhoids   MOUTH SURGERY  2011   STOMACH SURGERY     stapled for obesity/later reversed   Tailbone surgery     removal of coccyx   Family History  Problem Relation Age of Onset   Emphysema Mother    Kidney failure Sister    Heart Problems Brother    Diabetes Brother    Skin cancer Brother    Breast cancer Neg Hx    Social History   Socioeconomic History   Marital status: Widowed    Spouse name: Not on file    Number of children: 3   Years of education: Not on file   Highest education level: 12th grade  Occupational History   Occupation: Retired  Tobacco Use   Smoking status: Some Days    Current packs/day: 0.00    Average packs/day: 1 pack/day for 30.0 years (30.0 ttl pk-yrs)    Types: Cigarettes    Start date: 01/12/1971    Last attempt to quit: 01/11/2001    Years since quitting: 22.4    Passive exposure: Current   Smokeless tobacco: Never  Vaping Use   Vaping status: Never Used  Substance and Sexual Activity   Alcohol use: Not Currently    Comment: drinks 2 wine coolers  a day   Drug use: No   Sexual activity: Not on file  Other Topics Concern   Not on file  Social History Narrative   Not on file   Social Drivers of Health   Financial Resource Strain: Low Risk  (06/22/2023)   Overall Financial Resource Strain (CARDIA)    Difficulty of Paying Living Expenses: Not hard at all  Food Insecurity: No Food Insecurity (06/22/2023)   Hunger Vital Sign    Worried About Running Out of Food in the Last Year: Never true    Ran Out of Food in the Last Year: Never true  Transportation Needs: No Transportation Needs (06/22/2023)   PRAPARE - Administrator, Civil Service (Medical): No    Lack of Transportation (Non-Medical): No  Physical Activity: Insufficiently Active (06/22/2023)   Exercise Vital Sign    Days of Exercise per Week: 7 days    Minutes of Exercise per Session: 20 min  Stress: No Stress Concern Present (06/22/2023)   [employer] of Occupational Health - Occupational Stress Questionnaire    Feeling of Stress : Only a little  Social Connections: Moderately Isolated (06/22/2023)   Social Connection and Isolation Panel [NHANES]    Frequency of Communication with Friends and Family: More than three times a week    Frequency of Social Gatherings with Friends and Family: Never    Attends Religious Services: 1 to 4 times per year    Active Member of [employer] or  Organizations: No    Attends Banker Meetings: Never    Marital Status: Widowed    Tobacco Counseling Ready to quit: Not Answered Counseling given: Not Answered    Clinical Intake:  Pre-visit preparation completed: Yes  Pain : No/denies pain     BMI - recorded: 23 Nutritional Status: BMI of 19-24  Normal Nutritional Risks: None Diabetes: No  Lab Results  Component Value Date   HGBA1C 6.1 (H) 02/10/2021   HGBA1C 6.0 (A) 04/11/2020   HGBA1C 5.6 09/25/2019     How often do you need to have someone help you when you read instructions, pamphlets, or other written materials from your doctor or pharmacy?: 1 - Never  Interpreter Needed?: No  Information entered by :: Dellie Fergusson, LPN   Activities of Daily Living    06/22/2023    1:27 PM  In your present state of health, do you have any difficulty performing the following activities:  Hearing? 0  Vision? 0  Difficulty concentrating or making decisions? 0  Walking or climbing stairs? 0  Dressing or bathing? 0  Doing errands, shopping? 1  Preparing Food and eating ? Y  Comment PREP ONLY  Using the Toilet? N  In the past six months, have you accidently leaked urine? N  Do you have problems with loss of bowel control? N  Managing your Medications? Y  Managing your Finances? Y  Housekeeping or managing your Housekeeping? Y    Patient Care Team: Lamon Pillow, MD as PCP - General (Family Medicine) Clair Crews, MD as Referring Physician (Ophthalmology)  I have updated your Care Teams any recent Medical Services you may have received from other providers in the past year.     Assessment:    This is a routine wellness examination for Brynn.  Hearing/Vision screen Hearing Screening - Comments:: NO AIDS Vision Screening - Comments:: READERS-    Goals Addressed             This Visit's Progress  Cut out extra servings         Depression Screen     06/22/2023    1:21 PM  06/17/2023    2:20 PM 01/31/2023    2:12 PM 06/21/2022    1:12 PM 03/22/2022   11:07 AM 10/14/2021    9:30 AM 06/16/2021    1:29 PM  PHQ 2/9 Scores  PHQ - 2 Score 2 6 4 2 5 4 1   PHQ- 9 Score 3 6 6 7 13 12 1     Fall Risk     06/22/2023    1:26 PM 01/31/2023    2:07 PM 06/21/2022    1:08 PM 03/22/2022   11:07 AM 10/14/2021    9:30 AM  Fall Risk   Falls in the past year? 1 1 1 1 1   Number falls in past yr: 0 1 1 1  0  Injury with Fall? 1 0 0 1 1  Risk for fall due to : No Fall Risks;History of fall(s) Impaired balance/gait No Fall Risks  History of fall(s);Impaired balance/gait  Follow up Falls evaluation completed Falls prevention discussed;Education provided;Falls evaluation completed Education provided;Falls prevention discussed  Falls evaluation completed;Education provided;Falls prevention discussed    MEDICARE RISK AT HOME:  Medicare Risk at Home Any stairs in or around the home?: Yes If so, are there any without handrails?: No Home free of loose throw rugs in walkways, pet beds, electrical cords, etc?: Yes Adequate lighting in your home to reduce risk of falls?: Yes Life alert?: No Use of a cane, walker or w/c?: Yes (CANE OCCASIONALLY) Grab bars in the bathroom?: Yes Shower chair or bench in shower?: Yes Elevated toilet seat or a handicapped toilet?: Yes  TIMED UP AND GO:  Was the test performed?  No  Cognitive Function: 6CIT completed    06/26/2021    1:26 PM  MMSE - Mini Mental State Exam  Orientation to time 4  Orientation to Place 3  Registration 3  Attention/ Calculation 0  Recall 1  Language- name 2 objects 1  Language- repeat 1  Language- follow 3 step command 2  Language- read & follow direction 1  Write a sentence 1  Copy design 0  Total score 17        06/22/2023    1:31 PM 06/21/2022    1:15 PM 08/14/2019    9:53 AM 06/06/2018    9:31 AM 05/26/2016    9:14 AM  6CIT Screen  What Year? 4 points 0 points 0 points 0 points 0 points  What month? 0 points 0  points 0 points 0 points 0 points  What time? 0 points 0 points 0 points 0 points 0 points  Count back from 20 0 points 0 points 0 points 0 points 0 points  Months in reverse 2 points 0 points 0 points 2 points 0 points  Repeat phrase 4 points 8 points 2 points 0 points 6 points  Total Score 10 points 8 points 2 points 2 points 6 points    Immunizations Immunization History  Administered Date(s) Administered   Fluad Quad(high Dose 65+) 09/25/2019, 03/06/2021, 11/16/2021   Fluad Trivalent(High Dose 65+) 01/31/2023   Hepatitis B, ADULT 10/23/2018, 12/25/2018   Hepb-cpg 06/01/2019   Influenza, High Dose Seasonal PF 11/26/2014, 09/05/2015, 10/10/2016, 10/03/2017   Influenza-Unspecified 10/12/2018   PFIZER(Purple Top)SARS-COV-2 Vaccination 04/17/2019, 05/08/2019   Pneumococcal Conjugate-13 11/26/2014   Pneumococcal Polysaccharide-23 09/10/2008, 12/19/2009   Zoster Recombinant(Shingrix) 08/22/2018, 11/01/2018   Zoster, Live 11/21/2012  Screening Tests Health Maintenance  Topic Date Due   DTaP/Tdap/Td (1 - Tdap) Never done   COVID-19 Vaccine (3 - Pfizer risk series) 06/05/2019   Lung Cancer Screening  10/07/2022   Colonoscopy  01/31/2024 (Originally 12/23/2021)   INFLUENZA VACCINE  08/12/2023   Medicare Annual Wellness (AWV)  06/21/2024   DEXA SCAN  05/01/2025   Pneumonia Vaccine 31+ Years old  Completed   Zoster Vaccines- Shingrix  Completed   HPV VACCINES  Aged Out   Meningococcal B Vaccine  Aged Out   Hepatitis C Screening  Discontinued    Health Maintenance  Health Maintenance Due  Topic Date Due   DTaP/Tdap/Td (1 - Tdap) Never done   COVID-19 Vaccine (3 - Pfizer risk series) 06/05/2019   Lung Cancer Screening  10/07/2022   Health Maintenance Items Addressed: NO MORE COVIDS WANTED; NEEDS TDAP, UP TO DATE ON SHINGRIX, NEEDS PNA; UP TO DATE BDS  Additional Screening:  Vision Screening: Recommended annual ophthalmology exams for early detection of glaucoma and other  disorders of the eye. Would you like a referral to an eye doctor? No    Dental Screening: Recommended annual dental exams for proper oral hygiene  Community Resource Referral / Chronic Care Management: CRR required this visit?  No   CCM required this visit?  No   Plan:    I have personally reviewed and noted the following in the patient's chart:   Medical and social history Use of alcohol, tobacco or illicit drugs  Current medications and supplements including opioid prescriptions. Patient is not currently taking opioid prescriptions. Functional ability and status Nutritional status Physical activity Advanced directives List of other physicians Hospitalizations, surgeries, and ER visits in previous 12 months Vitals Screenings to include cognitive, depression, and falls Referrals and appointments  In addition, I have reviewed and discussed with patient certain preventive protocols, quality metrics, and best practice recommendations. A written personalized care plan for preventive services as well as general preventive health recommendations were provided to patient.   Pinky Bright, LPN   [id number]   After Visit Summary: (MyChart) Due to this being a telephonic visit, the after visit summary with patients personalized plan was offered to patient via MyChart   Notes: Nothing significant to report at this time.

## 2023-06-22 NOTE — Patient Instructions (Addendum)
 Ms. Guldin , Thank you for taking time out of your busy schedule to complete your Annual Wellness Visit with me. I enjoyed our conversation and look forward to speaking with you again next year. I, as well as your care team,  appreciate your ongoing commitment to your health goals. Please review the following plan we discussed and let me know if I can assist you in the future.   Follow up Visits: Next Medicare AWV with our clinical staff:   06/27/24 @ 9:30 AM BY PHONE Have you seen your provider in the last 6 months (3 months if uncontrolled diabetes)? Yes   Clinician Recommendations:  Aim for 30 minutes of exercise or brisk walking, 6-8 glasses of water, and 5 servings of fruits and vegetables each day. TAKE CARE!      This is a list of the screening recommended for you and due dates:  Health Maintenance  Topic Date Due   DTaP/Tdap/Td vaccine (1 - Tdap) Never done   COVID-19 Vaccine (3 - Pfizer risk series) 06/05/2019   Screening for Lung Cancer  10/07/2022   Colon Cancer Screening  01/31/2024*   Flu Shot  08/12/2023   Medicare Annual Wellness Visit  06/21/2024   DEXA scan (bone density measurement)  05/01/2025   Pneumonia Vaccine  Completed   Zoster (Shingles) Vaccine  Completed   HPV Vaccine  Aged Out   Meningitis B Vaccine  Aged Out   Hepatitis C Screening  Discontinued  *Topic was postponed. The date shown is not the original due date.    Advanced directives: (In Chart) A copy of your advanced directives are scanned into your chart should your provider ever need it. Advance Care Planning is important because it:  [x]  Makes sure you receive the medical care that is consistent with your values, goals, and preferences  [x]  It provides guidance to your family and loved ones and reduces their decisional burden about whether or not they are making the right decisions based on your wishes.  Follow the link provided in your after visit summary or read over the paperwork we have  mailed to you to help you started getting your Advance Directives in place. If you need assistance in completing these, please reach out to us  so that we can help you!

## 2023-06-28 DIAGNOSIS — C44622 Squamous cell carcinoma of skin of right upper limb, including shoulder: Secondary | ICD-10-CM | POA: Diagnosis not present

## 2023-06-28 DIAGNOSIS — L578 Other skin changes due to chronic exposure to nonionizing radiation: Secondary | ICD-10-CM | POA: Diagnosis not present

## 2023-06-28 DIAGNOSIS — L814 Other melanin hyperpigmentation: Secondary | ICD-10-CM | POA: Diagnosis not present

## 2023-06-28 DIAGNOSIS — L988 Other specified disorders of the skin and subcutaneous tissue: Secondary | ICD-10-CM | POA: Diagnosis not present

## 2023-07-06 ENCOUNTER — Other Ambulatory Visit: Payer: Self-pay | Admitting: Family Medicine

## 2023-07-06 DIAGNOSIS — F419 Anxiety disorder, unspecified: Secondary | ICD-10-CM

## 2023-07-08 ENCOUNTER — Telehealth: Payer: Self-pay | Admitting: Family Medicine

## 2023-07-08 ENCOUNTER — Other Ambulatory Visit: Payer: Self-pay | Admitting: Family Medicine

## 2023-07-08 DIAGNOSIS — F419 Anxiety disorder, unspecified: Secondary | ICD-10-CM

## 2023-07-08 NOTE — Telephone Encounter (Signed)
 Prescription converted into refill request

## 2023-07-08 NOTE — Telephone Encounter (Signed)
 [facility] pharmacy is requesting refill diazepam  (VALIUM ) 10 MG tablet   Please advise

## 2023-07-08 NOTE — Telephone Encounter (Signed)
 Copied from CRM [id number]. Topic: Clinical - Prescription Issue >> Jul 08, 2023 11:54 AM Winona R wrote: Daughter in law Rosaline calling in regards to pt diazepam  (VALIUM ) 10 MG tablet [[id number] refill. I advised the daughter in law refills can take up to 3 business days, Rosaline states that the pt will not last all weekend with out the medication.

## 2023-07-09 ENCOUNTER — Other Ambulatory Visit: Payer: Self-pay | Admitting: Family Medicine

## 2023-07-09 DIAGNOSIS — B351 Tinea unguium: Secondary | ICD-10-CM

## 2023-07-09 MED ORDER — DIAZEPAM 10 MG PO TABS: ORAL_TABLET | ORAL | 2 refills | Status: DC

## 2023-07-12 ENCOUNTER — Telehealth: Payer: Self-pay | Admitting: Family Medicine

## 2023-07-12 DIAGNOSIS — R7303 Prediabetes: Secondary | ICD-10-CM

## 2023-07-12 DIAGNOSIS — M810 Age-related osteoporosis without current pathological fracture: Secondary | ICD-10-CM

## 2023-07-12 DIAGNOSIS — I10 Essential (primary) hypertension: Secondary | ICD-10-CM

## 2023-07-12 NOTE — Telephone Encounter (Signed)
 Please advise patient it is time to check labs. Have placed order to check on liver functions, sugar, a1c and vitamin D . She does not need to fast for labs.

## 2023-07-12 NOTE — Telephone Encounter (Signed)
 Advised

## 2023-07-14 DIAGNOSIS — I10 Essential (primary) hypertension: Secondary | ICD-10-CM | POA: Diagnosis not present

## 2023-07-14 DIAGNOSIS — M810 Age-related osteoporosis without current pathological fracture: Secondary | ICD-10-CM | POA: Diagnosis not present

## 2023-07-14 DIAGNOSIS — R7303 Prediabetes: Secondary | ICD-10-CM | POA: Diagnosis not present

## 2023-07-15 LAB — COMPREHENSIVE METABOLIC PANEL WITH GFR
ALT: 6 IU/L (ref 0–32)
AST: 14 IU/L (ref 0–40)
Albumin: 4.2 g/dL (ref 3.8–4.8)
Alkaline Phosphatase: 47 IU/L (ref 44–121)
BUN/Creatinine Ratio: 17 (ref 12–28)
BUN: 18 mg/dL (ref 8–27)
Bilirubin Total: 0.2 mg/dL (ref 0.0–1.2)
CO2: 21 mmol/L (ref 20–29)
Calcium: 9.6 mg/dL (ref 8.7–10.3)
Chloride: 104 mmol/L (ref 96–106)
Creatinine, Ser: 1.09 mg/dL — ABNORMAL HIGH (ref 0.57–1.00)
Globulin, Total: 2.6 g/dL (ref 1.5–4.5)
Glucose: 145 mg/dL — ABNORMAL HIGH (ref 70–99)
Potassium: 4.8 mmol/L (ref 3.5–5.2)
Sodium: 144 mmol/L (ref 134–144)
Total Protein: 6.8 g/dL (ref 6.0–8.5)
eGFR: 51 mL/min/1.73 — ABNORMAL LOW (ref >59)

## 2023-07-15 LAB — CBC
Hematocrit: 41.3 % (ref 34.0–46.6)
Hemoglobin: 13.2 g/dL (ref 11.1–15.9)
MCH: 30.3 pg (ref 26.6–33.0)
MCHC: 32 g/dL (ref 31.5–35.7)
MCV: 95 fL (ref 79–97)
Platelets: 359 x10E3/uL (ref 150–450)
RBC: 4.35 x10E6/uL (ref 3.77–5.28)
RDW: 11.9 % (ref 11.7–15.4)
WBC: 8.5 x10E3/uL (ref 3.4–10.8)

## 2023-07-15 LAB — VITAMIN D 25 HYDROXY (VIT D DEFICIENCY, FRACTURES): Vit D, 25-Hydroxy: 41.6 ng/mL (ref 30.0–100.0)

## 2023-07-15 LAB — HEMOGLOBIN A1C
Est. average glucose Bld gHb Est-mCnc: 123 mg/dL
Hgb A1c MFr Bld: 5.9 % — ABNORMAL HIGH (ref 4.8–5.6)

## 2023-07-17 ENCOUNTER — Ambulatory Visit: Payer: Self-pay | Admitting: Family Medicine

## 2023-07-17 ENCOUNTER — Other Ambulatory Visit: Payer: Self-pay | Admitting: Family Medicine

## 2023-07-22 ENCOUNTER — Ambulatory Visit: Admitting: Family Medicine

## 2023-08-12 ENCOUNTER — Encounter: Payer: Self-pay | Admitting: Family Medicine

## 2023-08-12 ENCOUNTER — Ambulatory Visit (INDEPENDENT_AMBULATORY_CARE_PROVIDER_SITE_OTHER): Admitting: Family Medicine

## 2023-08-12 VITALS — BP 134/69 | HR 79 | Temp 98.9°F | Ht 65.0 in | Wt 147.0 lb

## 2023-08-12 DIAGNOSIS — K5904 Chronic idiopathic constipation: Secondary | ICD-10-CM | POA: Diagnosis not present

## 2023-08-12 MED ORDER — LACTULOSE 10 GM/15ML PO SOLN: 20.0000 g | Freq: Every day | ORAL | 1 refills | Status: DC | PRN

## 2023-08-12 NOTE — Progress Notes (Signed)
 Established patient visit   Patient: Joan Baldwin   DOB: September 24, 1945   78 y.o. Female  MRN: [id number] Visit Date: 08/12/2023  Today's healthcare provider: Nancyann Perry, MD   Chief Complaint  Patient presents with   Constipation    Patient states she was able to pass a small BM last night. Says she has been impacted before. States it has been a few months since she believes she has had a full bowel movement. Has taken fiber, suppositories and prune juice with no luck. Family member states after patient eats she lays flat and does not move around    Subjective    Discussed the use of AI scribe software for clinical note transcription with the patient, who gave verbal consent to proceed.  History of Present Illness   Joan Baldwin is an 78 year old female with chronic constipation who presents with bowel issues.  She has a long-standing history of constipation, having been impacted twice and requiring medical intervention for relief. She uses glycerin suppositories, prunes, and fiber supplements at home, and has tried various laxatives, including Duralax and Miralax , but continues to experience significant constipation.  Her bowel movements are infrequent, and she remains constipated most of the time. She recalls a childhood history of constipation, involving prolonged periods on the toilet with her mother administering enemas. She desires a daily medication that can manage her symptoms without causing excessive bowel movements.  Despite trying multiple treatments, including Miralax  up to four times a day, she remains constipated. She is resistant to using enemas and has not had a colonoscopy in about seven years.       Medications: Outpatient Medications Prior to Visit  Medication Sig   alendronate  (FOSAMAX ) 70 MG tablet TAKE 1 TABLET BY MOUTH EVERY 7 DAYS. TAKE WITH A FULL GLASS OF WATER ON EMPTY STOMACH   amLODipine  (NORVASC ) 5 MG tablet Take 1 tablet by mouth once  daily   Calcium  Carbonate-Vitamin D  (CALCIUM  600/VITAMIN D  PO) Take 1 tablet by mouth daily.    diazepam  (VALIUM ) 10 MG tablet TAKE 0.5-1 TABLET BY MOUTH 4 TIMES DAILY AS NEEDED FOR ANXIETY   Fiber POWD Take 2 scoop by mouth daily.   FLUoxetine  (PROZAC ) 20 MG capsule TAKE 1 CAPSULE BY MOUTH ONCE DAILY ALONG  WITH  40MG   CAPSULES  DAILY   lactulose  (CHRONULAC ) 10 GM/15ML solution Take 45 mLs (30 g total) by mouth 3 (three) times daily.   latanoprost  (XALATAN ) 0.005 % ophthalmic solution Place 1 drop into both eyes at bedtime.   lidocaine  (XYLOCAINE ) 2 % solution USE AS DIRECTED IN MOUTH OR THROAT EVERY 4-6 HOURS AS NEEDED FOR MOUTH PAIN   lisinopril  (ZESTRIL ) 10 MG tablet Take 1 tablet (10 mg total) by mouth daily.   Multiple Vitamins-Minerals (CENTRUM SILVER) tablet    Omega-3 Fatty Acids (FISH OIL) 1000 MG CAPS Take 1 capsule by mouth daily.   omeprazole  (PRILOSEC) 20 MG capsule Take 1 capsule by mouth once daily   QUEtiapine  (SEROQUEL ) 50 MG tablet TAKE 1 TABLET BY MOUTH AT BEDTIME   terbinafine  (LAMISIL ) 250 MG tablet Take 1 tablet by mouth once daily   timolol  (TIMOPTIC ) 0.5 % ophthalmic solution    traZODone  (DESYREL ) 100 MG tablet TAKE 1 TABLET BY MOUTH AT BEDTIME   No facility-administered medications prior to visit.   Review of Systems     Objective    BP 134/69 (BP Location: Left Arm, Patient Position: Sitting, Cuff Size: Normal)  Pulse 79   Temp 98.9 F (37.2 C)   Ht 5' 5 (1.651 m)   Wt 147 lb (66.7 kg)   SpO2 97%   BMI 24.46 kg/m   Physical Exam   General Appearance:    Well developed, well nourished female, alert, cooperative, in no acute distress  Eyes:    PERRL, conjunctiva/corneas clear, EOM's intact       Lungs:     Clear to auscultation bilaterally, respirations unlabored  Heart:    Normal heart rate. Normal rhythm. No murmurs, rubs, or gallops.    Abdomen:   bowel sounds present and normal in all 4 quadrants, soft, round, nontender, or nondistended.        Assessment & Plan        Chronic constipation Persistent constipation despite use of multiple OC stool softeners and stimulant laxatives.  - Prescribed lactulose  for current constipation. - Recommended Fleet's enema for blockage. - Consider Amitiza, linzess, or Trulance for long-term management post-resolution. - Telephone follow up next week for progress and maintenance discussion.    Nancyann Perry, MD  [facility] [phone] (phone) [phone] (fax)  [facility]

## 2023-08-18 ENCOUNTER — Telehealth: Payer: Self-pay | Admitting: Family Medicine

## 2023-08-18 DIAGNOSIS — K5904 Chronic idiopathic constipation: Secondary | ICD-10-CM

## 2023-08-18 MED ORDER — LUBIPROSTONE 24 MCG PO CAPS: 24.0000 ug | ORAL_CAPSULE | Freq: Two times a day (BID) | ORAL | 2 refills | Status: AC

## 2023-08-18 NOTE — Telephone Encounter (Signed)
 Please check with patient and see if she has been able to have BM since starting lactulose  last week. Have sent in prescription for Amitiza  which she should take once every day to keep from getting constipated in the future.

## 2023-08-18 NOTE — Telephone Encounter (Signed)
 DIL advised. Verbalized understanding and was able to confirm patient has made a BM since receiving lactulose  rx

## 2023-08-19 ENCOUNTER — Other Ambulatory Visit: Payer: Self-pay | Admitting: Family Medicine

## 2023-08-19 ENCOUNTER — Other Ambulatory Visit: Payer: Self-pay

## 2023-08-19 NOTE — Telephone Encounter (Signed)
 Medication has already been filled as of today 08/19/2023.

## 2023-09-15 ENCOUNTER — Telehealth: Payer: Self-pay

## 2023-09-15 NOTE — Telephone Encounter (Signed)
 Copied from CRM [id number]. Topic: Clinical - Medical Advice >> Sep 15, 2023 11:53 AM Precious C wrote: Reason for CRM: Othel Daring from [facility] called to inquire if the patient's provider has any medical concerns regarding the patient. She did not specify a particular issue, only requested to know if the provider has any concerns about the patient's health. Callback number: [phone]

## 2023-09-26 ENCOUNTER — Other Ambulatory Visit: Payer: Self-pay | Admitting: Family Medicine

## 2023-09-26 DIAGNOSIS — F419 Anxiety disorder, unspecified: Secondary | ICD-10-CM

## 2023-10-10 NOTE — Telephone Encounter (Signed)
 Nia from [facility] calling back about info from Dr Gasper if patient is , compliant with meds and appt and if patient has cognition issues. Please cb. Caller states needs this info asap so patient's case wont be closed.

## 2023-10-11 ENCOUNTER — Telehealth: Payer: Self-pay

## 2023-10-11 NOTE — Telephone Encounter (Signed)
 Last seen 08/12/2023, no concerns at that time or since

## 2023-10-11 NOTE — Telephone Encounter (Signed)
 Copied from CRM #[id number]. Topic: Medical Record Request - Other >> Oct 11, 2023  8:25 AM Berwyn MATSU wrote: Reason for CRM:  Othel Daring from [facility] called to inquire if the patient's provider has any medical concerns regarding the patient. She did not specify a particular issue, only requested to know if the provider has any concerns about the patient's health. Per Nia today is the last day and she needs a response.  Callback number: [phone] ok to leave voicemail as it is a secure line.

## 2023-10-11 NOTE — Telephone Encounter (Signed)
 Called and LVM for Joan Baldwin advising of Dr. Lyle message. Advised to call back with any questions or concerns

## 2023-10-14 NOTE — Telephone Encounter (Signed)
 She is compliant with follow up appts and To the best of my knowledge is compliant with medications. Not aware of any abnormal cognitive issues.

## 2023-10-17 NOTE — Telephone Encounter (Signed)
 Called [employer] and she stated information has been provided through voicemail.

## 2023-10-24 ENCOUNTER — Encounter: Payer: Self-pay | Admitting: Emergency Medicine

## 2023-10-24 ENCOUNTER — Inpatient Hospital Stay
Admission: EM | Admit: 2023-10-24 | Discharge: 2023-11-02 | DRG: 872 | Disposition: A | Discharge status: Skilled Nursing Facility | Attending: Obstetrics and Gynecology | Admitting: Obstetrics and Gynecology

## 2023-10-24 ENCOUNTER — Emergency Department

## 2023-10-24 ENCOUNTER — Other Ambulatory Visit: Payer: Self-pay

## 2023-10-24 DIAGNOSIS — Z7983 Long term (current) use of bisphosphonates: Secondary | ICD-10-CM

## 2023-10-24 DIAGNOSIS — B182 Chronic viral hepatitis C: Secondary | ICD-10-CM | POA: Diagnosis present

## 2023-10-24 DIAGNOSIS — I10 Essential (primary) hypertension: Secondary | ICD-10-CM | POA: Diagnosis present

## 2023-10-24 DIAGNOSIS — Z961 Presence of intraocular lens: Secondary | ICD-10-CM | POA: Diagnosis present

## 2023-10-24 DIAGNOSIS — N281 Cyst of kidney, acquired: Secondary | ICD-10-CM | POA: Diagnosis present

## 2023-10-24 DIAGNOSIS — R531 Weakness: Secondary | ICD-10-CM

## 2023-10-24 DIAGNOSIS — Z90710 Acquired absence of both cervix and uterus: Secondary | ICD-10-CM

## 2023-10-24 DIAGNOSIS — K219 Gastro-esophageal reflux disease without esophagitis: Secondary | ICD-10-CM | POA: Diagnosis present

## 2023-10-24 DIAGNOSIS — I214 Non-ST elevation (NSTEMI) myocardial infarction: Secondary | ICD-10-CM | POA: Diagnosis not present

## 2023-10-24 DIAGNOSIS — I2489 Other forms of acute ischemic heart disease: Secondary | ICD-10-CM | POA: Diagnosis not present

## 2023-10-24 DIAGNOSIS — Z885 Allergy status to narcotic agent status: Secondary | ICD-10-CM

## 2023-10-24 DIAGNOSIS — A4151 Sepsis due to Escherichia coli [E. coli]: Secondary | ICD-10-CM | POA: Diagnosis not present

## 2023-10-24 DIAGNOSIS — Z7982 Long term (current) use of aspirin: Secondary | ICD-10-CM | POA: Diagnosis not present

## 2023-10-24 DIAGNOSIS — R509 Fever, unspecified: Secondary | ICD-10-CM | POA: Diagnosis not present

## 2023-10-24 DIAGNOSIS — R5381 Other malaise: Secondary | ICD-10-CM | POA: Diagnosis present

## 2023-10-24 DIAGNOSIS — D649 Anemia, unspecified: Secondary | ICD-10-CM | POA: Diagnosis present

## 2023-10-24 DIAGNOSIS — F411 Generalized anxiety disorder: Secondary | ICD-10-CM | POA: Diagnosis present

## 2023-10-24 DIAGNOSIS — E876 Hypokalemia: Secondary | ICD-10-CM | POA: Diagnosis not present

## 2023-10-24 DIAGNOSIS — Z6825 Body mass index (BMI) 25.0-25.9, adult: Secondary | ICD-10-CM

## 2023-10-24 DIAGNOSIS — R6520 Severe sepsis without septic shock: Secondary | ICD-10-CM | POA: Diagnosis not present

## 2023-10-24 DIAGNOSIS — Z888 Allergy status to other drugs, medicaments and biological substances status: Secondary | ICD-10-CM

## 2023-10-24 DIAGNOSIS — A419 Sepsis, unspecified organism: Secondary | ICD-10-CM | POA: Diagnosis not present

## 2023-10-24 DIAGNOSIS — M6282 Rhabdomyolysis: Secondary | ICD-10-CM | POA: Diagnosis present

## 2023-10-24 DIAGNOSIS — N390 Urinary tract infection, site not specified: Secondary | ICD-10-CM

## 2023-10-24 DIAGNOSIS — E872 Acidosis, unspecified: Secondary | ICD-10-CM | POA: Diagnosis not present

## 2023-10-24 DIAGNOSIS — M810 Age-related osteoporosis without current pathological fracture: Secondary | ICD-10-CM | POA: Diagnosis not present

## 2023-10-24 DIAGNOSIS — R7989 Other specified abnormal findings of blood chemistry: Secondary | ICD-10-CM

## 2023-10-24 DIAGNOSIS — F32A Depression, unspecified: Secondary | ICD-10-CM | POA: Diagnosis present

## 2023-10-24 DIAGNOSIS — F17210 Nicotine dependence, cigarettes, uncomplicated: Secondary | ICD-10-CM | POA: Diagnosis not present

## 2023-10-24 DIAGNOSIS — K5909 Other constipation: Secondary | ICD-10-CM | POA: Diagnosis present

## 2023-10-24 DIAGNOSIS — N12 Tubulo-interstitial nephritis, not specified as acute or chronic: Secondary | ICD-10-CM | POA: Diagnosis present

## 2023-10-24 DIAGNOSIS — Z833 Family history of diabetes mellitus: Secondary | ICD-10-CM | POA: Diagnosis not present

## 2023-10-24 DIAGNOSIS — Z79899 Other long term (current) drug therapy: Secondary | ICD-10-CM

## 2023-10-24 DIAGNOSIS — E669 Obesity, unspecified: Secondary | ICD-10-CM | POA: Diagnosis present

## 2023-10-24 DIAGNOSIS — F0394 Unspecified dementia, unspecified severity, with anxiety: Secondary | ICD-10-CM | POA: Diagnosis present

## 2023-10-24 DIAGNOSIS — R739 Hyperglycemia, unspecified: Secondary | ICD-10-CM | POA: Diagnosis not present

## 2023-10-24 DIAGNOSIS — Z1152 Encounter for screening for COVID-19: Secondary | ICD-10-CM

## 2023-10-24 DIAGNOSIS — F419 Anxiety disorder, unspecified: Secondary | ICD-10-CM | POA: Diagnosis present

## 2023-10-24 MED ORDER — LACTATED RINGERS IV BOLUS (SEPSIS)
1000.0000 mL | Freq: Once | INTRAVENOUS | Status: AC
  Administered 2023-10-25: 1000 mL via INTRAVENOUS

## 2023-10-24 NOTE — ED Triage Notes (Signed)
 Pt arrives from home via [facility], family stated that pt was found on floor of living room.  Per pt she did not fall, she states that she sat down in floor d/t increased weakness x 1 week. Pt A&Ox4.  NAD.  Denies pain, NAD.  Per EMS ST 118-120, Non diabetic w/ CBG of 343.  T 100.4 AX.

## 2023-10-25 DIAGNOSIS — R0989 Other specified symptoms and signs involving the circulatory and respiratory systems: Secondary | ICD-10-CM | POA: Diagnosis not present

## 2023-10-25 DIAGNOSIS — B182 Chronic viral hepatitis C: Secondary | ICD-10-CM | POA: Diagnosis present

## 2023-10-25 DIAGNOSIS — N281 Cyst of kidney, acquired: Secondary | ICD-10-CM | POA: Diagnosis not present

## 2023-10-25 DIAGNOSIS — N12 Tubulo-interstitial nephritis, not specified as acute or chronic: Secondary | ICD-10-CM | POA: Diagnosis present

## 2023-10-25 DIAGNOSIS — F411 Generalized anxiety disorder: Secondary | ICD-10-CM | POA: Diagnosis present

## 2023-10-25 DIAGNOSIS — I10 Essential (primary) hypertension: Secondary | ICD-10-CM | POA: Diagnosis not present

## 2023-10-25 DIAGNOSIS — F32A Depression, unspecified: Secondary | ICD-10-CM | POA: Diagnosis present

## 2023-10-25 DIAGNOSIS — I214 Non-ST elevation (NSTEMI) myocardial infarction: Secondary | ICD-10-CM | POA: Diagnosis not present

## 2023-10-25 DIAGNOSIS — R7989 Other specified abnormal findings of blood chemistry: Secondary | ICD-10-CM

## 2023-10-25 DIAGNOSIS — Z833 Family history of diabetes mellitus: Secondary | ICD-10-CM | POA: Diagnosis not present

## 2023-10-25 DIAGNOSIS — N390 Urinary tract infection, site not specified: Secondary | ICD-10-CM

## 2023-10-25 DIAGNOSIS — K5909 Other constipation: Secondary | ICD-10-CM | POA: Diagnosis present

## 2023-10-25 DIAGNOSIS — Z1152 Encounter for screening for COVID-19: Secondary | ICD-10-CM | POA: Diagnosis not present

## 2023-10-25 DIAGNOSIS — E876 Hypokalemia: Secondary | ICD-10-CM | POA: Diagnosis present

## 2023-10-25 DIAGNOSIS — D649 Anemia, unspecified: Secondary | ICD-10-CM | POA: Diagnosis present

## 2023-10-25 DIAGNOSIS — A419 Sepsis, unspecified organism: Secondary | ICD-10-CM | POA: Diagnosis not present

## 2023-10-25 DIAGNOSIS — R531 Weakness: Secondary | ICD-10-CM | POA: Diagnosis not present

## 2023-10-25 DIAGNOSIS — R5381 Other malaise: Secondary | ICD-10-CM | POA: Diagnosis present

## 2023-10-25 DIAGNOSIS — Z7982 Long term (current) use of aspirin: Secondary | ICD-10-CM | POA: Diagnosis not present

## 2023-10-25 DIAGNOSIS — E669 Obesity, unspecified: Secondary | ICD-10-CM | POA: Diagnosis present

## 2023-10-25 DIAGNOSIS — E872 Acidosis, unspecified: Secondary | ICD-10-CM | POA: Diagnosis present

## 2023-10-25 DIAGNOSIS — F17210 Nicotine dependence, cigarettes, uncomplicated: Secondary | ICD-10-CM | POA: Diagnosis present

## 2023-10-25 DIAGNOSIS — M6282 Rhabdomyolysis: Secondary | ICD-10-CM | POA: Diagnosis present

## 2023-10-25 DIAGNOSIS — I2489 Other forms of acute ischemic heart disease: Secondary | ICD-10-CM | POA: Diagnosis not present

## 2023-10-25 DIAGNOSIS — B9620 Unspecified Escherichia coli [E. coli] as the cause of diseases classified elsewhere: Secondary | ICD-10-CM | POA: Diagnosis not present

## 2023-10-25 DIAGNOSIS — A4151 Sepsis due to Escherichia coli [E. coli]: Secondary | ICD-10-CM | POA: Diagnosis present

## 2023-10-25 DIAGNOSIS — K219 Gastro-esophageal reflux disease without esophagitis: Secondary | ICD-10-CM | POA: Diagnosis present

## 2023-10-25 DIAGNOSIS — R6520 Severe sepsis without septic shock: Secondary | ICD-10-CM | POA: Diagnosis not present

## 2023-10-25 DIAGNOSIS — F0394 Unspecified dementia, unspecified severity, with anxiety: Secondary | ICD-10-CM | POA: Diagnosis present

## 2023-10-25 DIAGNOSIS — M810 Age-related osteoporosis without current pathological fracture: Secondary | ICD-10-CM | POA: Diagnosis present

## 2023-10-25 DIAGNOSIS — Z7983 Long term (current) use of bisphosphonates: Secondary | ICD-10-CM | POA: Diagnosis not present

## 2023-10-25 LAB — URINALYSIS, W/ REFLEX TO CULTURE (INFECTION SUSPECTED)
Bilirubin Urine: NEGATIVE
Glucose, UA: NEGATIVE mg/dL
Ketones, ur: NEGATIVE mg/dL
Nitrite: POSITIVE — AB
Protein, ur: 100 mg/dL — AB
RBC / HPF: >50 RBC/hpf (ref 0–5)
Specific Gravity, Urine: 1.017 (ref 1.005–1.030)
WBC, UA: >50 WBC/hpf (ref 0–5)
pH: 5 (ref 5.0–8.0)

## 2023-10-25 LAB — BASIC METABOLIC PANEL WITH GFR
Anion gap: 8 (ref 5–15)
BUN: 23 mg/dL (ref 8–23)
CO2: 24 mmol/L (ref 22–32)
Calcium: 8.6 mg/dL — ABNORMAL LOW (ref 8.9–10.3)
Chloride: 108 mmol/L (ref 98–111)
Creatinine, Ser: 0.76 mg/dL (ref 0.44–1.00)
GFR, Estimated: >60 mL/min (ref >60)
Glucose, Bld: 223 mg/dL — ABNORMAL HIGH (ref 70–99)
Potassium: 3.4 mmol/L — ABNORMAL LOW (ref 3.5–5.1)
Sodium: 140 mmol/L (ref 135–145)

## 2023-10-25 LAB — CBC
HCT: 30.6 % — ABNORMAL LOW (ref 36.0–46.0)
Hemoglobin: 10 g/dL — ABNORMAL LOW (ref 12.0–15.0)
MCH: 30 pg (ref 26.0–34.0)
MCHC: 32.7 g/dL (ref 30.0–36.0)
MCV: 91.9 fL (ref 80.0–100.0)
Platelets: 225 K/uL (ref 150–400)
RBC: 3.33 MIL/uL — ABNORMAL LOW (ref 3.87–5.11)
RDW: 12.2 % (ref 11.5–15.5)
WBC: 16.3 K/uL — ABNORMAL HIGH (ref 4.0–10.5)
nRBC: 0 % (ref 0.0–0.2)

## 2023-10-25 LAB — CBC WITH DIFFERENTIAL/PLATELET
Abs Immature Granulocytes: 0.14 K/uL — ABNORMAL HIGH (ref 0.00–0.07)
Basophils Absolute: 0.1 K/uL (ref 0.0–0.1)
Basophils Relative: 0 %
Eosinophils Absolute: 0 K/uL (ref 0.0–0.5)
Eosinophils Relative: 0 %
HCT: 36.4 % (ref 36.0–46.0)
Hemoglobin: 11.7 g/dL — ABNORMAL LOW (ref 12.0–15.0)
Immature Granulocytes: 1 %
Lymphocytes Relative: 2 %
Lymphs Abs: 0.3 K/uL — ABNORMAL LOW (ref 0.7–4.0)
MCH: 29.7 pg (ref 26.0–34.0)
MCHC: 32.1 g/dL (ref 30.0–36.0)
MCV: 92.4 fL (ref 80.0–100.0)
Monocytes Absolute: 0.7 K/uL (ref 0.1–1.0)
Monocytes Relative: 4 %
Neutro Abs: 15.5 K/uL — ABNORMAL HIGH (ref 1.7–7.7)
Neutrophils Relative %: 93 %
Platelets: 285 K/uL (ref 150–400)
RBC: 3.94 MIL/uL (ref 3.87–5.11)
RDW: 12.1 % (ref 11.5–15.5)
WBC: 16.7 K/uL — ABNORMAL HIGH (ref 4.0–10.5)
nRBC: 0 % (ref 0.0–0.2)

## 2023-10-25 LAB — LACTIC ACID, PLASMA
Lactic Acid, Venous: 1.9 mmol/L (ref 0.5–1.9)
Lactic Acid, Venous: 3.2 mmol/L (ref 0.5–1.9)
Lactic Acid, Venous: 3.3 mmol/L (ref 0.5–1.9)

## 2023-10-25 LAB — COMPREHENSIVE METABOLIC PANEL WITH GFR
ALT: 13 U/L (ref 0–44)
AST: 44 U/L — ABNORMAL HIGH (ref 15–41)
Albumin: 3.4 g/dL — ABNORMAL LOW (ref 3.5–5.0)
Alkaline Phosphatase: 44 U/L (ref 38–126)
Anion gap: 11 (ref 5–15)
BUN: 31 mg/dL — ABNORMAL HIGH (ref 8–23)
CO2: 22 mmol/L (ref 22–32)
Calcium: 9.2 mg/dL (ref 8.9–10.3)
Chloride: 107 mmol/L (ref 98–111)
Creatinine, Ser: 1.15 mg/dL — ABNORMAL HIGH (ref 0.44–1.00)
GFR, Estimated: 48 mL/min — ABNORMAL LOW (ref >60)
Glucose, Bld: 294 mg/dL — ABNORMAL HIGH (ref 70–99)
Potassium: 3.6 mmol/L (ref 3.5–5.1)
Sodium: 140 mmol/L (ref 135–145)
Total Bilirubin: 0.5 mg/dL (ref 0.0–1.2)
Total Protein: 6.9 g/dL (ref 6.5–8.1)

## 2023-10-25 LAB — APTT: aPTT: 28 s (ref 24–36)

## 2023-10-25 LAB — TROPONIN I (HIGH SENSITIVITY)
Troponin I (High Sensitivity): 658 ng/L (ref <18)
Troponin I (High Sensitivity): 978 ng/L (ref <18)

## 2023-10-25 LAB — VITAMIN B12: Vitamin B-12: 479 pg/mL (ref 180–914)

## 2023-10-25 LAB — RESP PANEL BY RT-PCR (RSV, FLU A&B, COVID)  RVPGX2
Influenza A by PCR: NEGATIVE
Influenza B by PCR: NEGATIVE
Resp Syncytial Virus by PCR: NEGATIVE
SARS Coronavirus 2 by RT PCR: NEGATIVE

## 2023-10-25 LAB — TSH: TSH: 0.562 u[IU]/mL (ref 0.350–4.500)

## 2023-10-25 LAB — CK: Total CK: 3987 U/L — ABNORMAL HIGH (ref 38–234)

## 2023-10-25 LAB — PROTIME-INR
INR: 1.2 (ref 0.8–1.2)
Prothrombin Time: 16.3 s — ABNORMAL HIGH (ref 11.4–15.2)

## 2023-10-25 LAB — HEPARIN LEVEL (UNFRACTIONATED)
Heparin Unfractionated: 0.32 [IU]/mL (ref 0.30–0.70)
Heparin Unfractionated: <0.1 [IU]/mL — ABNORMAL LOW (ref 0.30–0.70)

## 2023-10-25 MED ORDER — TRAZODONE HCL 100 MG PO TABS
100.0000 mg | ORAL_TABLET | Freq: Every day | ORAL | Status: DC
Start: 2023-10-25 — End: 2023-11-02
  Administered 2023-10-25 – 2023-11-01 (×8): 100 mg via ORAL
  Filled 2023-10-25 (×8): qty 1

## 2023-10-25 MED ORDER — ACETAMINOPHEN 325 MG PO TABS
650.0000 mg | ORAL_TABLET | ORAL | Status: DC | PRN
  Administered 2023-10-25 – 2023-11-02 (×7): 650 mg via ORAL
  Filled 2023-10-25 (×7): qty 2

## 2023-10-25 MED ORDER — ONDANSETRON HCL 4 MG/2ML IJ SOLN: 4.0000 mg | Freq: Four times a day (QID) | INTRAMUSCULAR | Status: DC | PRN

## 2023-10-25 MED ORDER — FLUOXETINE HCL 20 MG PO CAPS
60.0000 mg | ORAL_CAPSULE | Freq: Every day | ORAL | Status: DC
Start: 2023-10-25 — End: 2023-10-25
  Administered 2023-10-25: 60 mg via ORAL
  Filled 2023-10-25: qty 3

## 2023-10-25 MED ORDER — NICOTINE 21 MG/24HR TD PT24
21.0000 mg | MEDICATED_PATCH | Freq: Every day | TRANSDERMAL | Status: DC
  Administered 2023-10-27 – 2023-10-31 (×3): 21 mg via TRANSDERMAL
  Filled 2023-10-25 (×9): qty 1

## 2023-10-25 MED ORDER — LACTATED RINGERS IV BOLUS (SEPSIS)
1000.0000 mL | Freq: Once | INTRAVENOUS | Status: AC
  Administered 2023-10-25: 1000 mL via INTRAVENOUS

## 2023-10-25 MED ORDER — NITROGLYCERIN 0.4 MG SL SUBL: 0.4000 mg | SUBLINGUAL_TABLET | SUBLINGUAL | Status: DC | PRN

## 2023-10-25 MED ORDER — PANTOPRAZOLE SODIUM 40 MG PO TBEC
40.0000 mg | DELAYED_RELEASE_TABLET | Freq: Every day | ORAL | Status: DC
  Administered 2023-10-25 – 2023-11-02 (×9): 40 mg via ORAL
  Filled 2023-10-25 (×9): qty 1

## 2023-10-25 MED ORDER — SODIUM CHLORIDE 0.9 % IV SOLN
2.0000 g | INTRAVENOUS | Status: DC
  Administered 2023-10-25: 2 g via INTRAVENOUS
  Filled 2023-10-25: qty 20

## 2023-10-25 MED ORDER — QUETIAPINE FUMARATE 25 MG PO TABS
50.0000 mg | ORAL_TABLET | Freq: Every day | ORAL | Status: DC
Start: 2023-10-25 — End: 2023-11-02
  Administered 2023-10-25 – 2023-11-01 (×9): 50 mg via ORAL
  Filled 2023-10-25 (×9): qty 2

## 2023-10-25 MED ORDER — HEPARIN BOLUS VIA INFUSION
2100.0000 [IU] | Freq: Once | INTRAVENOUS | Status: AC
  Administered 2023-10-25: 2100 [IU] via INTRAVENOUS
  Filled 2023-10-25: qty 2100

## 2023-10-25 MED ORDER — LACTATED RINGERS IV SOLN
150.0000 mL/h | INTRAVENOUS | Status: DC
  Administered 2023-10-25 (×2): 150 mL/h via INTRAVENOUS

## 2023-10-25 MED ORDER — ASPIRIN 81 MG PO CHEW
324.0000 mg | CHEWABLE_TABLET | Freq: Once | ORAL | Status: AC
  Administered 2023-10-25: 324 mg via ORAL
  Filled 2023-10-25: qty 4

## 2023-10-25 MED ORDER — DIAZEPAM 5 MG PO TABS
5.0000 mg | ORAL_TABLET | Freq: Four times a day (QID) | ORAL | Status: DC | PRN
Start: 2023-10-25 — End: 2023-11-02
  Administered 2023-10-25 – 2023-10-31 (×11): 5 mg via ORAL
  Filled 2023-10-25 (×11): qty 1

## 2023-10-25 MED ORDER — LACTATED RINGERS IV BOLUS (SEPSIS)
250.0000 mL | Freq: Once | INTRAVENOUS | Status: AC
  Administered 2023-10-25: 250 mL via INTRAVENOUS

## 2023-10-25 MED ORDER — FIBER PO POWD: 2.0000 | Freq: Every day | ORAL | Status: DC

## 2023-10-25 MED ORDER — ASPIRIN 81 MG PO TBEC
81.0000 mg | DELAYED_RELEASE_TABLET | Freq: Every day | ORAL | Status: DC
  Administered 2023-10-26 – 2023-11-02 (×8): 81 mg via ORAL
  Filled 2023-10-25 (×8): qty 1

## 2023-10-25 MED ORDER — HEPARIN (PORCINE) 25000 UT/250ML-% IV SOLN
1450.0000 [IU]/h | INTRAVENOUS | Status: DC
  Administered 2023-10-25: 900 [IU]/h via INTRAVENOUS
  Administered 2023-10-25: 1100 [IU]/h via INTRAVENOUS
  Filled 2023-10-25 (×3): qty 250

## 2023-10-25 MED ORDER — PSYLLIUM 95 % PO PACK
1.0000 | PACK | Freq: Every day | ORAL | Status: DC
  Administered 2023-10-25 – 2023-11-02 (×6): 1 via ORAL
  Filled 2023-10-25 (×9): qty 1

## 2023-10-25 MED ORDER — LUBIPROSTONE 24 MCG PO CAPS
24.0000 ug | ORAL_CAPSULE | Freq: Two times a day (BID) | ORAL | Status: DC
  Administered 2023-10-25 – 2023-11-01 (×9): 24 ug via ORAL
  Filled 2023-10-25 (×18): qty 1

## 2023-10-25 MED ORDER — SODIUM CHLORIDE 0.9 % IV SOLN
2.0000 g | Freq: Once | INTRAVENOUS | Status: AC
  Administered 2023-10-25: 2 g via INTRAVENOUS
  Filled 2023-10-25: qty 20

## 2023-10-25 MED ORDER — HEPARIN BOLUS VIA INFUSION
4000.0000 [IU] | Freq: Once | INTRAVENOUS | Status: AC
  Administered 2023-10-25: 4000 [IU] via INTRAVENOUS
  Filled 2023-10-25: qty 4000

## 2023-10-25 MED ORDER — POTASSIUM CHLORIDE IN NACL 20-0.9 MEQ/L-% IV SOLN
INTRAVENOUS | Status: AC
  Filled 2023-10-25 (×3): qty 1000

## 2023-10-25 MED ORDER — LACTULOSE 10 GM/15ML PO SOLN: 20.0000 g | Freq: Every day | ORAL | Status: DC | PRN

## 2023-10-25 MED ORDER — MENTHOL 3 MG MT LOZG
1.0000 | LOZENGE | OROMUCOSAL | Status: DC | PRN
  Administered 2023-10-25 – 2023-10-26 (×4): 3 mg via ORAL
  Filled 2023-10-25 (×2): qty 9

## 2023-10-25 NOTE — Progress Notes (Signed)
 ANTICOAGULATION CONSULT NOTE  Pharmacy Consult for heparin infusion Indication: ACS/NSTEMI  Allergies  Allergen Reactions   Gabapentin Itching and Rash    blister  blisters   Oxcarbazepine Other (See Comments)    Double vision   Fluoxetine      Makes her feel weird and have tremors   Tramadol Hcl Other (See Comments)    Insomnia   Ultram  [Tramadol Hcl]     Insomnia   Codeine Itching and Rash    Patient Measurements: Height: 5' 5 (165.1 cm) Weight: 69.4 kg (153 lb) IBW/kg (Calculated) : 57 HEPARIN DW (KG): 69.4  Vital Signs: Temp: 97.6 F (36.4 C) (10/14 1853) Temp Source: Oral (10/14 1853) BP: 180/57 (10/14 1853) Pulse Rate: 76 (10/14 1853)  Labs: Recent Labs    10/24/23 2355 10/25/23 0140 10/25/23 0410 10/25/23 1014 10/25/23 1752  HGB 11.7*  --  10.0*  --   --   HCT 36.4  --  30.6*  --   --   PLT 285  --  225  --   --   APTT 28  --   --   --   --   LABPROT 16.3*  --   --   --   --   INR 1.2  --   --   --   --   HEPARINUNFRC  --   --   --  0.32 <0.10*  CREATININE 1.15*  --  0.76  --   --   CKTOTAL  --   --  3,987*  --   --   TROPONINIHS 658* 978*  --   --   --     Estimated Creatinine Clearance: 54.9 mL/min (by C-G formula based on SCr of 0.76 mg/dL).   Medical History: Past Medical History:  Diagnosis Date   Anxiety    can cause palpitations   Balance problems    had brain scan (10/02/19) / clearance given by Dr Gasper   Cataract    Complication of anesthesia    patient state, they have to give me alot to go out during colonoscopy   Depression    GERD (gastroesophageal reflux disease)    Glaucoma    Hepatitis    B w treatment/ been cleared (Dr Lyle records)   History of chicken pox    Hypertension    controlled on meds   Osteoporosis    Sepsis (HCC) 01/17/2016   Wears dentures    upper    Assessment: Pt is a 77 yo female presenting to ED after being found on floor after sitting down d/t increased weakness, found with elevated  Troponin I level, and being started on heparin for NSTEMI, possibly due to severe sepsis, but cannot rule out ACS. Patient was not on any AC prior to admission per chart review. Pharmacy has been consulted for heparin dosing.   Goal of Therapy:  Heparin level 0.3-0.7 units/ml Monitor platelets by anticoagulation protocol: Yes   Date Time HL Rate/Comment  10/14 1014 0.32 Therapeutic x1 @ 900 units/hr 10/14 1752 <0.10 SUBtherapeutic  Plan:  10/14 1752 HL <0.10 SUBtherapeutic -verified w/ ED RN that drip is running -Will bolus 2100 units - Will increase heparin infusion to 1100 units/hr - Will recheck HL in 8 hr after rate change - CBC daily while on heparin  Ervey Fallin A, PharmD Clinical Pharmacist 10/25/2023 7:00 PM

## 2023-10-25 NOTE — Progress Notes (Signed)
 Interim progress note not for billing.  Admitted earlier this morning by my colleague. I have seen and examined the patient, reviewed the chart, and agree with the assessment and plan unless stipulated otherwise.   Presents found down, with encephalopathy, appears to be septic from a uti, hemodynamically stable, lactate normalized, cultures are in process, will continue fluids and ceftriaxone . Also asymptomatic troponin elevation, cardiology following, tentative plan for now is continue heparin and f/u tte.

## 2023-10-25 NOTE — ED Notes (Signed)
 Pt assisted with bedpan due to weakness and pt linen changed, placed into hospital gown out of home clothes.

## 2023-10-25 NOTE — ED Provider Notes (Incomplete)
 [facility] Provider Note    Event Date/Time   First MD Initiated Contact with Patient 10/24/23 2330     (approximate)   History   Weakness   HPI Joan Baldwin is a 78 y.o. female who presents by EMS for evaluation of generalized weakness.  She was so weak today that her family found her on the floor of the living room but she says that she did not fall, she just sat down on the floor because of being increasingly weak for the last week and she could not get up.  She is alert and oriented.  She very adamantly states that she has had no pain at any point.  She specifically denies chest pain, shortness of breath, nausea, vomiting, abdominal pain, flank pain, dysuria, and increased urinary frequency.  She does not think that she has had a fever.  She said that she just feels wiped out with no energy and no strength, although she has no weakness or numbness in a specific arm or leg, just generalized fatigue and malaise throughout.  She reports no prior history of any heart issues and she has a primary care doctor but does not have a cardiologist because she has never needed one.    Physical Exam   Triage Vital Signs: ED Triage Vitals  Encounter Vitals Group     BP 10/24/23 2337 (!) 130/46     Girls Systolic BP Percentile --      Girls Diastolic BP Percentile --      Boys Systolic BP Percentile --      Boys Diastolic BP Percentile --      Pulse Rate 10/24/23 2337 (!) 108     Resp 10/24/23 2337 (!) 26     Temp 10/24/23 2337 98 F (36.7 C)     Temp Source 10/24/23 2337 Oral     SpO2 10/24/23 2337 98 %     Weight 10/24/23 2342 69.4 kg (153 lb)     Height 10/24/23 2342 1.651 m (5' 5)     Head Circumference --      Peak Flow --      Pain Score 10/24/23 2342 0     Pain Loc --      Pain Education --      Exclude from Growth Chart --     Most recent vital signs: Vitals:   10/25/23 0030 10/25/23 0033  BP: 128/60   Pulse: 98   Resp: 18   Temp:  (!)  100.8 F (38.2 C)  SpO2: 100%     General: Awake, alert, appears ill but nontoxic.  Pleasant and conversant, oriented x 3. CV:  Good peripheral perfusion.  Mild tachycardia, regular rhythm.  Normal heart sounds. Resp:  Normal effort though she is slightly tachypneic. Speaking easily and comfortably, no accessory muscle usage nor intercostal retractions.  Lungs are clear to auscultation bilaterally. Abd:  No distention.  No tenderness to palpation of the abdomen nor tenderness to percussion of her flanks.   ED Results / Procedures / Treatments   Labs (all labs ordered are listed, but only abnormal results are displayed) Labs Reviewed  LACTIC ACID, PLASMA - Abnormal; Notable for the following components:      Result Value   Lactic Acid, Venous 3.2 (*)    All other components within normal limits  COMPREHENSIVE METABOLIC PANEL WITH GFR - Abnormal; Notable for the following components:   Glucose, Bld 294 (*)    BUN 31 (*)  Creatinine, Ser 1.15 (*)    Albumin 3.4 (*)    AST 44 (*)    GFR, Estimated 48 (*)    All other components within normal limits  CBC WITH DIFFERENTIAL/PLATELET - Abnormal; Notable for the following components:   WBC 16.7 (*)    Hemoglobin 11.7 (*)    Neutro Abs 15.5 (*)    Lymphs Abs 0.3 (*)    Abs Immature Granulocytes 0.14 (*)    All other components within normal limits  PROTIME-INR - Abnormal; Notable for the following components:   Prothrombin Time 16.3 (*)    All other components within normal limits  URINALYSIS, W/ REFLEX TO CULTURE (INFECTION SUSPECTED) - Abnormal; Notable for the following components:   Color, Urine YELLOW (*)    APPearance CLOUDY (*)    Hgb urine dipstick LARGE (*)    Protein, ur 100 (*)    Nitrite POSITIVE (*)    Leukocytes,Ua LARGE (*)    Bacteria, UA MANY (*)    All other components within normal limits  TROPONIN I (HIGH SENSITIVITY) - Abnormal; Notable for the following components:   Troponin I (High Sensitivity) 658 (*)     All other components within normal limits  RESP PANEL BY RT-PCR (RSV, FLU A&B, COVID)  RVPGX2  CULTURE, BLOOD (ROUTINE X 2)  CULTURE, BLOOD (ROUTINE X 2)  URINE CULTURE  APTT  LACTIC ACID, PLASMA  LIPOPROTEIN A (LPA)  CBC  BASIC METABOLIC PANEL WITH GFR  TROPONIN I (HIGH SENSITIVITY)     EKG  ED ECG REPORT I, Darleene Dome, the attending physician, personally viewed and interpreted this ECG.  Date: 10/24/2023 EKG Time: 23:50 Rate: 106 Rhythm: sinus tachycardia (mild) QRS Axis: normal Intervals: normal ST/T Wave abnormalities: Non-specific ST segment / T-wave changes, but no clear evidence of acute ischemia. Narrative Interpretation: no definitive evidence of acute ischemia; does not meet STEMI criteria.    RADIOLOGY I independently viewed and interpreted the patient's chest x-ray and I see no evidence of pneumonia.  I also read the radiologist's report, which confirmed no acute findings.   PROCEDURES:  Critical Care performed: Yes, see critical care procedure note(s)  .Critical Care  Performed by: Dome Darleene, MD Authorized by: Dome Darleene, MD   Critical care provider statement:    Critical care time (minutes):  30   Critical care time was exclusive of:  Separately billable procedures and treating other patients   Critical care was necessary to treat or prevent imminent or life-threatening deterioration of the following conditions:  Sepsis and circulatory failure   Critical care was time spent personally by me on the following activities:  Development of treatment plan with patient or surrogate, evaluation of patient's response to treatment, examination of patient, obtaining history from patient or surrogate, ordering and performing treatments and interventions, ordering and review of laboratory studies, ordering and review of radiographic studies, pulse oximetry, re-evaluation of patient's condition and review of old charts .1-3 Lead EKG  Interpretation  Performed by: Dome Darleene, MD Authorized by: Dome Darleene, MD     Interpretation: abnormal     ECG rate:  102   ECG rate assessment: tachycardic     Rhythm: sinus tachycardia     Ectopy: none     Conduction: normal       IMPRESSION / MDM / ASSESSMENT AND PLAN / ED COURSE  I reviewed the triage vital signs and the nursing notes.  Differential diagnosis includes, but is not limited to, sepsis, UTI, pneumonia, intra-abdominal infection, obstructive kidney stone, ACS, CVA.  Patient's presentation is most consistent with acute presentation with potential threat to life or bodily function.  Labs/studies ordered: I ordered standard sepsis labs/studies including the following: respiratory viral panel PCR swab, blood cultures x2, pro time-INR, CMP, urinalysis, urine culture, lactic acid, CBC with differential, high-sensitivity troponin, lipase, 1-view chest x-ray, EKG.  Interventions/Medications given:  Medications  lactated ringers  bolus 1,000 mL (1,000 mLs Intravenous New Bag/Given 10/25/23 0117)    And  lactated ringers  bolus 250 mL (has no administration in time range)  heparin bolus via infusion 4,000 Units (has no administration in time range)  heparin ADULT infusion 100 units/mL (25000 units/250mL) (has no administration in time range)  diazepam  (VALIUM ) tablet 5 mg (has no administration in time range)  FLUoxetine  (PROZAC ) capsule 60 mg (has no administration in time range)  QUEtiapine  (SEROQUEL ) tablet 50 mg (has no administration in time range)  lactulose  (CHRONULAC ) 10 GM/15ML solution 20 g (has no administration in time range)  lubiprostone  (AMITIZA ) capsule 24 mcg (has no administration in time range)  pantoprazole  (PROTONIX ) EC tablet 40 mg (has no administration in time range)  aspirin  EC tablet 81 mg (has no administration in time range)  nitroGLYCERIN (NITROSTAT) SL tablet 0.4 mg (has no administration in time range)   acetaminophen  (TYLENOL ) tablet 650 mg (has no administration in time range)  ondansetron  (ZOFRAN ) injection 4 mg (has no administration in time range)  lactated ringers  infusion (has no administration in time range)  cefTRIAXone  (ROCEPHIN ) 2 g in sodium chloride  0.9 % 100 mL IVPB (has no administration in time range)  psyllium (HYDROCIL/METAMUCIL) 1 packet (has no administration in time range)  lactated ringers  bolus 1,000 mL (0 mLs Intravenous Stopped 10/25/23 0126)  cefTRIAXone  (ROCEPHIN ) 2 g in sodium chloride  0.9 % 100 mL IVPB (0 g Intravenous Stopped 10/25/23 0140)  aspirin  chewable tablet 324 mg (324 mg Oral Given 10/25/23 0124)    (Note:  hospital course my include additional interventions and/or labs/studies not listed above.)   Strongly suspect sepsis, initial oral temperature was normal but I ordered a rectal temperature and in and out catheterization.  Patient is tachycardic and slightly tachypneic and looks ill although she is nontoxic in appearance and fully alert and oriented.  She is having no chest pain or shortness of breath which is reassuring.  I anticipate she will likely have a urinary tract infection.  I ordered an initial 1 L LR IV bolus and possible sepsis lab work as well as a troponin.   The patient is on the cardiac monitor to evaluate for evidence of arrhythmia and/or significant heart rate changes.   Clinical Course as of 10/25/23 0223  Tue Oct 25, 2023  0042 Urinalysis, w/ Reflex to Culture (Infection Suspected) -Urine, Catheterized(!) Grossly positive urinalysis.  The patient is tachycardic and febrile and meets criteria for sepsis.  I am initiating a code sepsis protocol including 30 mL/kg of IV fluids LR and empiric treatment for community-acquired urinary tract infection with ceftriaxone  2 g IV. [CF]  0051 Lactic Acid, Venous(!!): 3.2 [CF]  0100 Strongly suspect that the patient is having demand ischemia/NSTEMI as a result of her severe sepsis.  She  reiterated multiple times that she has not had any chest pain and she has a reassuring EKG.  I am ordering heparin per protocol for NSTEMI as a precaution but I suspect that addressing the sepsis will also address the  demand ischemia/NSTEMI.  I strongly doubt she has an obstructive stone given that she has had no pain and I do not see an indication for additional imaging at this time.  Patient understands the need to stay in the hospital for treatment.  I also updated her daughter-in-law who is at bedside and agrees with the plan. [CF]  0101 Resp panel by RT-PCR (RSV, Flu A&B, Covid) Urine, Catheterized negative [CF]  0102 I am consulting the hospitalist team for admission.   [CF]  0104 I consulted by phone with the admitting hospitalist, and they will admit the patient - Dr. Cleatus. [CF]  0210 I completed a sepsis reassessment.  Patient remains hemodynamically stable.  Ordered third lactic acid as per protocol based on [facility] recommendations.  Updated hospitalist by secure text. [CF]    Clinical Course User Index [CF] Gordan Huxley, MD     FINAL CLINICAL IMPRESSION(S) / ED DIAGNOSES   Final diagnoses:  Severe sepsis (HCC)  Urinary tract infection without hematuria, site unspecified  NSTEMI (non-ST elevated myocardial infarction) (HCC)  Demand ischemia (HCC)  Generalized weakness     Rx / DC Orders   ED Discharge Orders     None        Note:  This document was prepared using Dragon voice recognition software and may include unintentional dictation errors.   Gordan Huxley, MD 10/25/23 9858    Gordan Huxley, MD 10/25/23 [phone]

## 2023-10-25 NOTE — Progress Notes (Signed)
 CODE SEPSIS - PHARMACY COMMUNICATION  **Broad Spectrum Antibiotics should be administered within 1 hour of Sepsis diagnosis**  Time Code Sepsis Called/Page Received: 9956  Antibiotics Ordered: Ceftriaxone   Time of 1st antibiotic administration: 0100  Rankin CANDIE Dills, PharmD, [facility] 10/25/2023 12:49 AM

## 2023-10-25 NOTE — ED Notes (Signed)
 CCMD notified of pt placed on cardiac monitor

## 2023-10-25 NOTE — ED Notes (Signed)
 Pt requesting 'nighttime medications' Offered Seroquel  as ordered, pt stating 'it's two pills not just that one. Just call the doctor'

## 2023-10-25 NOTE — ED Notes (Signed)
 EDP Gordan made aware of pt elevated Lactic and Troponin levels.

## 2023-10-25 NOTE — Assessment & Plan Note (Signed)
 Last seen by GI in 2020, unsure whether treated on

## 2023-10-25 NOTE — Progress Notes (Addendum)
 ANTICOAGULATION CONSULT NOTE  Pharmacy Consult for heparin infusion Indication: ACS/NSTEMI  Allergies  Allergen Reactions   Gabapentin Itching and Rash    blister  blisters   Oxcarbazepine Other (See Comments)    Double vision   Fluoxetine      Makes her feel weird and have tremors   Tramadol Hcl Other (See Comments)    Insomnia   Ultram  [Tramadol Hcl]     Insomnia   Codeine Itching and Rash    Patient Measurements: Height: 5' 5 (165.1 cm) Weight: 69.4 kg (153 lb) IBW/kg (Calculated) : 57 HEPARIN DW (KG): 69.4  Vital Signs: Temp: 97.7 F (36.5 C) (10/14 0730) Temp Source: Oral (10/14 0730) BP: 118/57 (10/14 0900) Pulse Rate: 65 (10/14 0730)  Labs: Recent Labs    10/24/23 2355 10/25/23 0140 10/25/23 0410 10/25/23 1014  HGB 11.7*  --  10.0*  --   HCT 36.4  --  30.6*  --   PLT 285  --  225  --   APTT 28  --   --   --   LABPROT 16.3*  --   --   --   INR 1.2  --   --   --   HEPARINUNFRC  --   --   --  0.32  CREATININE 1.15*  --  0.76  --   CKTOTAL  --   --  3,987*  --   TROPONINIHS 658* 978*  --   --     Estimated Creatinine Clearance: 54.9 mL/min (by C-G formula based on SCr of 0.76 mg/dL).   Medical History: Past Medical History:  Diagnosis Date   Anxiety    can cause palpitations   Balance problems    had brain scan (10/02/19) / clearance given by Dr Gasper   Cataract    Complication of anesthesia    patient state, they have to give me alot to go out during colonoscopy   Depression    GERD (gastroesophageal reflux disease)    Glaucoma    Hepatitis    B w treatment/ been cleared (Dr Lyle records)   History of chicken pox    Hypertension    controlled on meds   Osteoporosis    Sepsis (HCC) 01/17/2016   Wears dentures    upper    Assessment: Pt is a 78 yo female presenting to ED after being found on floor after sitting down d/t increased weakness, found with elevated Troponin I level, and being started on heparin for NSTEMI, possibly  due to severe sepsis, but cannot rule out ACS. Patient was not on any AC prior to admission per chart review. Pharmacy has been consulted for heparin dosing.   Goal of Therapy:  Heparin level 0.3-0.7 units/ml Monitor platelets by anticoagulation protocol: Yes   Date Time HL Rate/Comment  10/14 1014 0.32 Therapeutic x1 @ 900 units/hr  Plan:  - HL is therapeutic - Will continue heparin infusion at 900 units/hr - Will check HL in 8 hr then daily after x2 consecutive therapeutic heparin levels  - CBC daily while on heparin  Annabella LOISE Banks, PharmD Clinical Pharmacist 10/25/2023 11:35 AM

## 2023-10-25 NOTE — ED Notes (Signed)
 Cleatus MD notified that patient still has not urinated despite fluids given. No new orders at this time.

## 2023-10-25 NOTE — Progress Notes (Signed)
 ANTICOAGULATION CONSULT NOTE  Pharmacy Consult for heparin infusion Indication: ACS/nSTEMI  Allergies  Allergen Reactions   Gabapentin Itching and Rash    blister  blisters   Oxcarbazepine Other (See Comments)    Double vision   Fluoxetine      Makes her feel weird and have tremors   Tramadol Hcl Other (See Comments)    Insomnia   Ultram  [Tramadol Hcl]     Insomnia   Codeine Itching and Rash    Patient Measurements: Height: 5' 5 (165.1 cm) Weight: 69.4 kg (153 lb) IBW/kg (Calculated) : 57 HEPARIN DW (KG): 69.4  Vital Signs: Temp: 100.8 F (38.2 C) (10/14 0033) Temp Source: Rectal (10/14 0033) BP: 128/60 (10/14 0030) Pulse Rate: 98 (10/14 0030)  Labs: Recent Labs    10/24/23 2355  HGB 11.7*  HCT 36.4  PLT 285  LABPROT 16.3*  INR 1.2  CREATININE 1.15*  TROPONINIHS 658*    Estimated Creatinine Clearance: 38.2 mL/min (A) (by C-G formula based on SCr of 1.15 mg/dL (H)).   Medical History: Past Medical History:  Diagnosis Date   Anxiety    can cause palpitations   Balance problems    had brain scan (10/02/19) / clearance given by Dr Gasper   Cataract    Complication of anesthesia    patient state, they have to give me alot to go out during colonoscopy   Depression    GERD (gastroesophageal reflux disease)    Glaucoma    Hepatitis    B w treatment/ been cleared (Dr Lyle records)   History of chicken pox    Hypertension    controlled on meds   Osteoporosis    Sepsis (HCC) 01/17/2016   Wears dentures    upper    Assessment: Pt is a 78 yo female presenting to ED after being found on floor after sitting down d/t increased weakness, found with elevated Troponin I level, and being started on heparin for NSTEMI, possibly due to severe sepsis, but cannot rule out ACS.  Goal of Therapy:  Heparin level 0.3-0.7 units/ml Monitor platelets by anticoagulation protocol: Yes   Plan:  Bolus 4000 units x 1 Start heparin infusion at 900 units/hr Will  check HL in 8 hr after start of infusion CBC daily while on heparin  Carmine Carrozza S. Nasha Diss, PharmD, [facility] 10/25/2023 1:17 AM

## 2023-10-25 NOTE — ED Notes (Signed)
Shift report received, assumed care of patient at this time 

## 2023-10-25 NOTE — ED Notes (Signed)
 Assumed care of pt from [facility].

## 2023-10-25 NOTE — Assessment & Plan Note (Signed)
 Continue diazepam  and fluoxetine 

## 2023-10-25 NOTE — Assessment & Plan Note (Addendum)
 Suspecting demand ischemia No history of CAD.  Does have history of tobacco use Patient denies chest pain, EKG nonacute Continue to trend troponins Continue heparin started in the ED Will get echo in the a.m. to evaluate for segmental wall motion abnormality Cardiology consult

## 2023-10-25 NOTE — ED Notes (Signed)
 Pt has had 2500 mL of LR and has drank water with no urine output. Cleatus MD notified. No new orders.

## 2023-10-25 NOTE — Assessment & Plan Note (Signed)
 Sepsis criteria include fever, tachycardia and tachypnea, leukocytosis with lactic acidosis and abnormal UA Continue sepsis fluids Continue Rocephin  Follow cultures

## 2023-10-25 NOTE — H&P (Signed)
 History and Physical    Patient: Joan Baldwin FMW:[id number] DOB: 23-Feb-1945 DOA: 10/24/2023 DOS: the patient was seen and examined on 10/25/2023 PCP: Gasper Nancyann BRAVO, MD  Patient coming from: Home  Chief Complaint:  Chief Complaint  Patient presents with   Weakness    HPI: Joan Baldwin is a 78 y.o. female with medical history significant for HTN, depression and anxiety, chronic hepatitis C, chronic severe constipation being admitted with sepsis due to possible UTI after presenting with a 1 week history of weakness.  Per EMS family found patient on the floor of the living room.  Patient denies falling but stated she was so weak she had to sit down on the floor.  She denied pain.  Denied nausea, vomiting or abdominal pain, cough, chest pain, fever or chills, change in bowel habits from baseline In the ED Tmax 100.8, tachycardic to 108 tachypneic to 26 with normal blood pressures, O2 sat 100% on room air.Labs notable for WBC 16,000 and lactic acid 3.2 with UA strongly positive for UTI with positive nitrites and large leukocyte esterase with many bacteria. Respiratory viral panel was negative Troponin 658 Glucose 294, creatinine 1.15 (most recent baseline 1.09 about 3 months ago) AST 44 with otherwise normal LFTs Hemoglobin 11.7 down from baseline of 13.2 about 3 months prior, With normal platelets EKG with sinus tachycardia at 106 Chest x-ray nonacute Patient treated with sepsis fluids started on Rocephin  Also started on a heparin infusion due to elevated troponin in the 600s Admission requested     Review of Systems: As mentioned in the history of present illness. All other systems reviewed and are negative.  Past Medical History:  Diagnosis Date   Anxiety    can cause palpitations   Balance problems    had brain scan (10/02/19) / clearance given by Dr Gasper   Cataract    Complication of anesthesia    patient state, they have to give me alot to go out during  colonoscopy   Depression    GERD (gastroesophageal reflux disease)    Glaucoma    Hepatitis    B w treatment/ been cleared (Dr Lyle records)   History of chicken pox    Hypertension    controlled on meds   Osteoporosis    Sepsis (HCC) 01/17/2016   Wears dentures    upper   Past Surgical History:  Procedure Laterality Date   ABDOMINAL HYSTERECTOMY     vaginal. Still has both ovaries   back surgeries     x 2   CATARACT EXTRACTION W/PHACO Left 10/09/2019   Procedure: CATARACT EXTRACTION PHACO AND INTRAOCULAR LENS PLACEMENT (IOC) LEFT;  Surgeon: Jaye Fallow, MD;  Location: [facility];  Service: Ophthalmology;  Laterality: Left;  6.16 0:46.0   CATARACT EXTRACTION W/PHACO Right 10/30/2019   Procedure: CATARACT EXTRACTION PHACO AND INTRAOCULAR LENS PLACEMENT (IOC) RIGHT 6.31 00:46.1;  Surgeon: Jaye Fallow, MD;  Location: [facility];  Service: Ophthalmology;  Laterality: Right;   COLONOSCOPY  2007   done in [place] per patient. Normal except for moderate hemorrhoids   MOUTH SURGERY  2011   STOMACH SURGERY     stapled for obesity/later reversed   Tailbone surgery     removal of coccyx   Social History:  reports that she quit smoking about 22 years ago. Her smoking use included cigarettes. She started smoking about 52 years ago. She has a 30 pack-year smoking history. She has been exposed to tobacco smoke. She has  never used smokeless tobacco. She reports that she does not currently use alcohol. She reports that she does not use drugs.  Allergies  Allergen Reactions   Gabapentin Itching and Rash    blister  blisters   Oxcarbazepine Other (See Comments)    Double vision   Fluoxetine      Makes her feel weird and have tremors   Tramadol Hcl Other (See Comments)    Insomnia   Ultram  [Tramadol Hcl]     Insomnia   Codeine Itching and Rash    Family History  Problem Relation Age of Onset   Emphysema Mother    Kidney failure Sister    Heart  Problems Brother    Diabetes Brother    Skin cancer Brother    Breast cancer Neg Hx     Prior to Admission medications   Medication Sig Start Date End Date Taking? Authorizing Provider  alendronate  (FOSAMAX ) 70 MG tablet TAKE 1 TABLET BY MOUTH EVERY 7 DAYS. TAKE WITH A FULL GLASS OF WATER ON EMPTY STOMACH 05/11/23   Gasper Nancyann BRAVO, MD  amLODipine  (NORVASC ) 5 MG tablet Take 1 tablet by mouth once daily 05/24/23   Gasper Nancyann BRAVO, MD  Calcium  Carbonate-Vitamin D  (CALCIUM  600/VITAMIN D  PO) Take 1 tablet by mouth daily.     [provider]  diazepam  (VALIUM ) 10 MG tablet TAKE 1/2 TO 1 (ONE-HALF TO ONE) TABLET BY MOUTH 4 TIMES DAILY AS NEEDED FOR ANXIETY 09/28/23   Gasper Nancyann BRAVO, MD  Fiber POWD Take 2 scoop by mouth daily.    [provider]  FLUoxetine  (PROZAC ) 20 MG capsule TAKE 1 CAPSULE BY MOUTH ONCE DAILY ALONG  WITH  40MG   CAPSULE  DAILY 08/19/23   Gasper Nancyann BRAVO, MD  lactulose  (CHRONULAC ) 10 GM/15ML solution Take 45 mLs (30 g total) by mouth 3 (three) times daily. 02/14/22   Kommor, Madison, MD  lactulose  (CHRONULAC ) 10 GM/15ML solution Take 30 mLs (20 g total) by mouth daily as needed for moderate constipation. 08/12/23   Gasper Nancyann BRAVO, MD  latanoprost  (XALATAN ) 0.005 % ophthalmic solution Place 1 drop into both eyes at bedtime.    [provider]  lidocaine  (XYLOCAINE ) 2 % solution USE AS DIRECTED IN MOUTH OR THROAT EVERY 4-6 HOURS AS NEEDED FOR MOUTH PAIN 07/30/22   Gasper Nancyann BRAVO, MD  lisinopril  (ZESTRIL ) 10 MG tablet Take 1 tablet (10 mg total) by mouth daily. 07/20/22   Gasper Nancyann BRAVO, MD  lubiprostone  (AMITIZA ) 24 MCG capsule Take 1 capsule (24 mcg total) by mouth 2 (two) times daily with a meal. 08/18/23   Gasper Nancyann BRAVO, MD  Multiple Vitamins-Minerals (CENTRUM SILVER) tablet  12/08/20   [provider]  Omega-3 Fatty Acids (FISH OIL) 1000 MG CAPS Take 1 capsule by mouth daily. 08/04/09   [provider]  omeprazole  (PRILOSEC) 20 MG  capsule Take 1 capsule by mouth once daily 01/15/23   Gasper Nancyann BRAVO, MD  QUEtiapine  (SEROQUEL ) 50 MG tablet TAKE 1 TABLET BY MOUTH AT BEDTIME 07/17/23   Gasper Nancyann BRAVO, MD  terbinafine  (LAMISIL ) 250 MG tablet Take 1 tablet by mouth once daily 07/09/23   Gasper Nancyann BRAVO, MD  timolol  (TIMOPTIC ) 0.5 % ophthalmic solution     [provider]  traZODone  (DESYREL ) 100 MG tablet TAKE 1 TABLET BY MOUTH AT BEDTIME 02/03/23   Gasper Nancyann BRAVO, MD    Physical Exam: Vitals:   10/24/23 2342 10/25/23 0026 10/25/23 0030 10/25/23 0033  BP:  ROLLEN)  125/55 128/60   Pulse:  (!) 102 98   Resp:  (!) 25 18   Temp:    (!) 100.8 F (38.2 C)  TempSrc:    Rectal  SpO2:  100% 100%   Weight: 69.4 kg     Height: 5' 5 (1.651 m)      Physical Exam Vitals and nursing note reviewed.  Constitutional:      General: She is not in acute distress. HENT:     Head: Normocephalic and atraumatic.  Cardiovascular:     Rate and Rhythm: Regular rhythm. Tachycardia present.     Heart sounds: Normal heart sounds.  Pulmonary:     Effort: Tachypnea present.     Breath sounds: Normal breath sounds.  Abdominal:     Palpations: Abdomen is soft.     Tenderness: There is no abdominal tenderness.  Neurological:     Mental Status: Mental status is at baseline.     Labs on Admission: I have personally reviewed following labs and imaging studies  CBC: Recent Labs  Lab 10/24/23 2355  WBC 16.7*  NEUTROABS 15.5*  HGB 11.7*  HCT 36.4  MCV 92.4  PLT 285   Basic Metabolic Panel: Recent Labs  Lab 10/24/23 2355  NA 140  K 3.6  CL 107  CO2 22  GLUCOSE 294*  BUN 31*  CREATININE 1.15*  CALCIUM  9.2   GFR: Estimated Creatinine Clearance: 38.2 mL/min (A) (by C-G formula based on SCr of 1.15 mg/dL (H)). Liver Function Tests: Recent Labs  Lab 10/24/23 2355  AST 44*  ALT 13  ALKPHOS 44  BILITOT 0.5  PROT 6.9  ALBUMIN 3.4*   No results for input(s): LIPASE, AMYLASE in the last 168 hours. No results  for input(s): AMMONIA in the last 168 hours. Coagulation Profile: Recent Labs  Lab 10/24/23 2355  INR 1.2   Cardiac Enzymes: No results for input(s): CKTOTAL, CKMB, CKMBINDEX, TROPONINI in the last 168 hours. BNP (last 3 results) No results for input(s): PROBNP in the last 8760 hours. HbA1C: No results for input(s): HGBA1C in the last 72 hours. CBG: No results for input(s): GLUCAP in the last 168 hours. Lipid Profile: No results for input(s): CHOL, HDL, LDLCALC, TRIG, CHOLHDL, LDLDIRECT in the last 72 hours. Thyroid  Function Tests: No results for input(s): TSH, T4TOTAL, FREET4, T3FREE, THYROIDAB in the last 72 hours. Anemia Panel: No results for input(s): VITAMINB12, FOLATE, FERRITIN, TIBC, IRON, RETICCTPCT in the last 72 hours. Urine analysis:    Component Value Date/Time   COLORURINE YELLOW (A) 10/25/2023 0018   APPEARANCEUR CLOUDY (A) 10/25/2023 0018   LABSPEC 1.017 10/25/2023 0018   PHURINE 5.0 10/25/2023 0018   GLUCOSEU NEGATIVE 10/25/2023 0018   HGBUR LARGE (A) 10/25/2023 0018   BILIRUBINUR NEGATIVE 10/25/2023 0018   KETONESUR NEGATIVE 10/25/2023 0018   PROTEINUR 100 (A) 10/25/2023 0018   NITRITE POSITIVE (A) 10/25/2023 0018   LEUKOCYTESUR LARGE (A) 10/25/2023 0018    Radiological Exams on Admission: DG Chest Port 1 View Result Date: 10/25/2023 EXAM: 1 VIEW(S) XRAY OF THE CHEST 10/25/2023 12:01:37 AM COMPARISON: 11/17/2022 CLINICAL HISTORY: Questionable sepsis - evaluate for abnormality. weakness FINDINGS: LUNGS AND PLEURA: Low lung volumes. No focal pulmonary opacity. No pulmonary edema. No pleural effusion. No pneumothorax. HEART AND MEDIASTINUM: No acute abnormality of the cardiac and mediastinal silhouettes. BONES AND SOFT TISSUES: Left upper quadrant surgical clips noted. No acute osseous abnormality. IMPRESSION: 1. No acute cardiopulmonary abnormality. Electronically signed by: Pinkie Pebbles MD 10/25/2023 12:09  AM EDT  RP Workstation: [id number]   Data Reviewed for HPI: Relevant notes from primary care and specialist visits, past discharge summaries as available in EHR, including Care Everywhere. Prior diagnostic testing as pertinent to current admission diagnoses Updated medications and problem lists for reconciliation ED course, including vitals, labs, imaging, treatment and response to treatment Triage notes, nursing and pharmacy notes and ED provider's notes Notable results as noted above in HPI      Assessment and Plan: * Sepsis secondary to UTI (HCC) Sepsis criteria include fever, tachycardia and tachypnea, leukocytosis with lactic acidosis and abnormal UA Continue sepsis fluids Continue Rocephin  Follow cultures  Elevated troponin Suspecting demand ischemia No history of CAD.  Does have history of tobacco use Patient denies chest pain, EKG nonacute Continue to trend troponins Continue heparin started in the ED Will get echo in the a.m. to evaluate for segmental wall motion abnormality Cardiology consult  Chronic constipation Continue daily psyllium Continue lactulose  as needed  Chronic hepatitis C virus genotype 1a infection (HCC) Last seen by GI in 2020, unsure whether treated on  Essential (primary) hypertension Hold amlodipine  and lisinopril  in the setting of sepsis and resume as appropriate  Anxiety Continue diazepam  and fluoxetine      DVT prophylaxis: Lovenox   Consults: cardiology, [facility]  Advance Care Planning:   Code Status: Prior   Family Communication: none  Disposition Plan: Back to previous home environment  Severity of Illness: The appropriate patient status for this patient is OBSERVATION. Observation status is judged to be reasonable and necessary in order to provide the required intensity of service to ensure the patient's safety. The patient's presenting symptoms, physical exam findings, and initial radiographic and laboratory data in the context  of their medical condition is felt to place them at decreased risk for further clinical deterioration. Furthermore, it is anticipated that the patient will be medically stable for discharge from the hospital within 2 midnights of admission.   Author: Delayne LULLA Solian, MD 10/25/2023 1:24 AM  For on call review [email or web address].

## 2023-10-25 NOTE — Assessment & Plan Note (Signed)
 Hold amlodipine  and lisinopril  in the setting of sepsis and resume as appropriate

## 2023-10-25 NOTE — Sepsis Progress Note (Addendum)
 Elink monitoring for the code sepsis protocol.   9579: Notified bedside nurse of need to draw 3rd lactic acid.

## 2023-10-25 NOTE — Assessment & Plan Note (Signed)
 Continue daily psyllium Continue lactulose  as needed

## 2023-10-25 NOTE — Consult Note (Signed)
 Cardiology Consultation   Patient ID: Joan NGHIEM MRN: [id number]; DOB: 1945/11/10  Admit date: 10/24/2023 Date of Consult: 10/25/2023  PCP:  Gasper Joan BRAVO, MD   [facility] Providers Cardiologist:  None        Patient Profile: Joan Baldwin is a 78 y.o. female with a hx of hypertension, depression, anxiety, chronic hepatitis C, and chronic constipation who is being seen 10/25/2023 for the evaluation of elevated troponin at the request of Dr. Cleatus.  History of Present Illness: Joan Baldwin reports decreased energy over the past several days.  She reports that she has been napping in the afternoons which is unusual for her.  Yesterday, she reports she did not have energy to do anything.  Her family found her on the floor of her living room, although she reports she did not fall.  She could not get up.  She denies any chest pain, shortness of breath, lightheadedness, dizziness, palpitations, and lower extremity swelling.  They called EMS to bring her to the emergency department for further evaluation.  In the ED, BP 130/46, HR 108 bpm, RR 26, and SpO2 98%.  Initial temperature normal but 100.8 F on recheck.  Pertinent labs include BUN 31, creatinine 1.15, WBC 16.7, neutrophils 15.5.  Lactic acid 3.2 > 3.3 > 1.9.  Troponin 658 > 978.  CK 3987.  UA positive for infection.  Cultures pending.  Chest x-ray negative for acute abnormality.  Patient was meeting sepsis criteria and subsequently started on IV fluids and empiric treatment for UTI.  Continue started on IV heparin due to elevated troponin.  Cardiology was asked to consult for further evaluation of elevated troponin.  At time of cardiology consult, patient mental status has improved.  She is alert and oriented x 3.  She continues to deny any chest pain.  She denies having any personal history of heart disease and has never seen a cardiologist.  She does have ongoing tobacco use of ~1 pack/day.  Denies illicit drug and  alcohol use.   Past Medical History:  Diagnosis Date   Anxiety    can cause palpitations   Balance problems    had brain scan (10/02/19) / clearance given by Dr Gasper   Cataract    Complication of anesthesia    patient state, they have to give me alot to go out during colonoscopy   Depression    GERD (gastroesophageal reflux disease)    Glaucoma    Hepatitis    B w treatment/ been cleared (Dr Lyle records)   History of chicken pox    Hypertension    controlled on meds   Osteoporosis    Sepsis (HCC) 01/17/2016   Wears dentures    upper    Past Surgical History:  Procedure Laterality Date   ABDOMINAL HYSTERECTOMY     vaginal. Still has both ovaries   back surgeries     x 2   CATARACT EXTRACTION W/PHACO Left 10/09/2019   Procedure: CATARACT EXTRACTION PHACO AND INTRAOCULAR LENS PLACEMENT (IOC) LEFT;  Surgeon: Jaye Fallow, MD;  Location: [facility];  Service: Ophthalmology;  Laterality: Left;  6.16 0:46.0   CATARACT EXTRACTION W/PHACO Right 10/30/2019   Procedure: CATARACT EXTRACTION PHACO AND INTRAOCULAR LENS PLACEMENT (IOC) RIGHT 6.31 00:46.1;  Surgeon: Jaye Fallow, MD;  Location: [facility];  Service: Ophthalmology;  Laterality: Right;   COLONOSCOPY  2007   done in [place] per patient. Normal except for moderate hemorrhoids   MOUTH SURGERY  2011   STOMACH SURGERY     stapled for obesity/later reversed   Tailbone surgery     removal of coccyx       Scheduled Meds:  [START ON 10/26/2023] aspirin  EC  81 mg Oral Daily   lubiprostone   24 mcg Oral BID WC   nicotine  21 mg Transdermal Daily   pantoprazole   40 mg Oral Daily   psyllium  1 packet Oral Daily   QUEtiapine   50 mg Oral QHS   Continuous Infusions:  0.9 % NaCl with KCl 20 mEq / L 100 mL/hr at 10/25/23 1003   cefTRIAXone  (ROCEPHIN )  IV     heparin 900 Units/hr (10/25/23 0143)   PRN Meds: acetaminophen , diazepam , lactulose , menthol, nitroGLYCERIN, ondansetron  (ZOFRAN )  IV  Allergies:    Allergies  Allergen Reactions   Gabapentin Itching and Rash    blister  blisters   Oxcarbazepine Other (See Comments)    Double vision   Fluoxetine      Makes her feel weird and have tremors   Tramadol Hcl Other (See Comments)    Insomnia   Ultram  [Tramadol Hcl]     Insomnia   Codeine Itching and Rash    Social History:   Social History   Socioeconomic History   Marital status: Widowed    Spouse name: Not on file   Number of children: 3   Years of education: Not on file   Highest education level: 12th grade  Occupational History   Occupation: Retired  Tobacco Use   Smoking status: Former    Current packs/day: 0.00    Average packs/day: 1 pack/day for 30.0 years (30.0 ttl pk-yrs)    Types: Cigarettes    Start date: 01/12/1971    Quit date: 01/11/2001    Years since quitting: 22.8    Passive exposure: Current   Smokeless tobacco: Never  Vaping Use   Vaping status: Never Used  Substance and Sexual Activity   Alcohol use: Not Currently    Comment: drinks 2 wine coolers a day   Drug use: No   Sexual activity: Not on file  Other Topics Concern   Not on file  Social History Narrative   Not on file   Social Drivers of Health   Financial Resource Strain: Low Risk  (06/22/2023)   Overall Financial Resource Strain (CARDIA)    Difficulty of Paying Living Expenses: Not hard at all  Food Insecurity: No Food Insecurity (06/22/2023)   Hunger Vital Sign    Worried About Running Out of Food in the Last Year: Never true    Ran Out of Food in the Last Year: Never true  Transportation Needs: No Transportation Needs (06/22/2023)   PRAPARE - Administrator, Civil Service (Medical): No    Lack of Transportation (Non-Medical): No  Physical Activity: Insufficiently Active (06/22/2023)   Exercise Vital Sign    Days of Exercise per Week: 7 days    Minutes of Exercise per Session: 20 min  Stress: No Stress Concern Present (06/22/2023)   [employer] - Occupational Stress Questionnaire    Feeling of Stress : Only a little  Social Connections: Moderately Isolated (06/22/2023)   Social Connection and Isolation Panel    Frequency of Communication with Friends and Family: More than three times a week    Frequency of Social Gatherings with Friends and Family: Never    Attends Religious Services: 1 to 4 times per year    Active  Member of Clubs or Organizations: No    Attends Banker Meetings: Never    Marital Status: Widowed  Intimate Partner Violence: Not At Risk (06/22/2023)   Humiliation, Afraid, Rape, and Kick questionnaire    Fear of Current or Ex-Partner: No    Emotionally Abused: No    Physically Abused: No    Sexually Abused: No    Family History:    Family History  Problem Relation Age of Onset   Emphysema Mother    Kidney failure Sister    Heart Problems Brother    Diabetes Brother    Skin cancer Brother    Breast cancer Neg Hx      ROS:  Please see the history of present illness.   Physical Exam/Data: Vitals:   10/25/23 0800 10/25/23 0830 10/25/23 0900 10/25/23 1241  BP: (!) 117/56 (!) 116/54 (!) 118/57 (!) 131/56  Pulse:    76  Resp: (!) 21 20 (!) 21 (!) 23  Temp:    98 F (36.7 C)  TempSrc:    Oral  SpO2:    94%  Weight:      Height:        Intake/Output Summary (Last 24 hours) at 10/25/2023 1503 Last data filed at 10/25/2023 0417 Gross per 24 hour  Intake 3500 ml  Output 300 ml  Net 3200 ml      10/24/2023   11:42 PM 08/12/2023   11:33 AM 06/17/2023    2:13 PM  Last 3 Weights  Weight (lbs) 153 lb 147 lb 138 lb  Weight (kg) 69.4 kg 66.679 kg 62.596 kg     Body mass index is 25.46 kg/m.  General:  Well nourished, well developed, in no acute distress HEENT: normal Neck: no JVD Vascular: No carotid bruits; Distal pulses 2+ bilaterally Cardiac:  normal S1, S2; RRR; no murmur Lungs:  clear to auscultation bilaterally, no wheezing, rhonchi or rales  Abd: soft,  nontender, no hepatomegaly  Ext: no edema Skin: warm and dry  Psych:  Normal affect   EKG:  The EKG was personally reviewed and demonstrates:  Sinus tachycardia, rate 106 bpm Telemetry:  Telemetry was personally reviewed and demonstrates:  Sinus rhythm with PVCs  Relevant CV Studies:  Echo pending  Laboratory Data: High Sensitivity Troponin:   Recent Labs  Lab 10/24/23 2355 10/25/23 0140  TROPONINIHS 658* 978*     Chemistry Recent Labs  Lab 10/24/23 2355 10/25/23 0410  NA 140 140  K 3.6 3.4*  CL 107 108  CO2 22 24  GLUCOSE 294* 223*  BUN 31* 23  CREATININE 1.15* 0.76  CALCIUM  9.2 8.6*  GFRNONAA 48* >60  ANIONGAP 11 8    Recent Labs  Lab 10/24/23 2355  PROT 6.9  ALBUMIN 3.4*  AST 44*  ALT 13  ALKPHOS 44  BILITOT 0.5   Lipids No results for input(s): CHOL, TRIG, HDL, LABVLDL, LDLCALC, CHOLHDL in the last 168 hours.  Hematology Recent Labs  Lab 10/24/23 2355 10/25/23 0410  WBC 16.7* 16.3*  RBC 3.94 3.33*  HGB 11.7* 10.0*  HCT 36.4 30.6*  MCV 92.4 91.9  MCH 29.7 30.0  MCHC 32.1 32.7  RDW 12.1 12.2  PLT 285 225   Thyroid   Recent Labs  Lab 10/25/23 1014  TSH 0.562    BNPNo results for input(s): BNP, PROBNP in the last 168 hours.  DDimer No results for input(s): DDIMER in the last 168 hours.  Radiology/Studies:  [facility] Chest Port 1 View Result Date:  10/25/2023 IMPRESSION: 1. No acute cardiopulmonary abnormality. Electronically signed by: Pinkie Pebbles MD 10/25/2023 12:09 AM EDT RP Workstation: [id number]   Assessment and Plan:  Sepsis due to UTI - Patient presenting with altered mental status, found down by family - UA grossly positive for infection - Ongoing management with antibiotics and IV fluids per IM  Elevated troponin - Denies chest pain - Troponin 658 > 978 - EKG without acute ischemic changes - Suspect demand ischemia in the setting of above - Started on heparin gtt, continue for now - Obtain echo with  further recommendations pending results  Hypertension - Continue to hold PTA antihypertensives in the setting of sepsis    For questions or updates, please contact [facility] Please consult [email or web address] for contact info under      Signed, Lesley LITTIE Maffucci, PA-C  10/25/2023 3:03 PM

## 2023-10-26 ENCOUNTER — Inpatient Hospital Stay

## 2023-10-26 ENCOUNTER — Inpatient Hospital Stay: Admit: 2023-10-26 | Discharge: 2023-10-26 | Disposition: A | Attending: Internal Medicine

## 2023-10-26 DIAGNOSIS — N281 Cyst of kidney, acquired: Secondary | ICD-10-CM | POA: Diagnosis not present

## 2023-10-26 DIAGNOSIS — N390 Urinary tract infection, site not specified: Secondary | ICD-10-CM | POA: Diagnosis not present

## 2023-10-26 DIAGNOSIS — I2489 Other forms of acute ischemic heart disease: Secondary | ICD-10-CM | POA: Diagnosis not present

## 2023-10-26 DIAGNOSIS — A419 Sepsis, unspecified organism: Principal | ICD-10-CM | POA: Diagnosis present

## 2023-10-26 DIAGNOSIS — I214 Non-ST elevation (NSTEMI) myocardial infarction: Secondary | ICD-10-CM

## 2023-10-26 DIAGNOSIS — R7989 Other specified abnormal findings of blood chemistry: Secondary | ICD-10-CM

## 2023-10-26 DIAGNOSIS — R6520 Severe sepsis without septic shock: Secondary | ICD-10-CM

## 2023-10-26 DIAGNOSIS — R531 Weakness: Secondary | ICD-10-CM | POA: Diagnosis not present

## 2023-10-26 LAB — CBC
HCT: 30.7 % — ABNORMAL LOW (ref 36.0–46.0)
Hemoglobin: 9.9 g/dL — ABNORMAL LOW (ref 12.0–15.0)
MCH: 29.6 pg (ref 26.0–34.0)
MCHC: 32.2 g/dL (ref 30.0–36.0)
MCV: 91.6 fL (ref 80.0–100.0)
Platelets: 199 K/uL (ref 150–400)
RBC: 3.35 MIL/uL — ABNORMAL LOW (ref 3.87–5.11)
RDW: 12.1 % (ref 11.5–15.5)
WBC: 7.6 K/uL (ref 4.0–10.5)
nRBC: 0 % (ref 0.0–0.2)

## 2023-10-26 LAB — ECHOCARDIOGRAM COMPLETE
AR max vel: 2.72 cm2
AV Area VTI: 2.68 cm2
AV Area mean vel: 2.73 cm2
AV Mean grad: 6 mmHg
AV Peak grad: 8.9 mmHg
Ao pk vel: 1.49 m/s
Area-P 1/2: 2.79 cm2
Height: 65 in
MV VTI: 2.27 cm2
S' Lateral: 1.9 cm
Weight: 2447.99 [oz_av]

## 2023-10-26 LAB — CKMB (ARMC ONLY): CK, MB: 4.9 ng/mL (ref 0.5–5.0)

## 2023-10-26 LAB — BASIC METABOLIC PANEL WITH GFR
Anion gap: 13 (ref 5–15)
BUN: 11 mg/dL (ref 8–23)
CO2: 20 mmol/L — ABNORMAL LOW (ref 22–32)
Calcium: 7.8 mg/dL — ABNORMAL LOW (ref 8.9–10.3)
Chloride: 110 mmol/L (ref 98–111)
Creatinine, Ser: 0.78 mg/dL (ref 0.44–1.00)
GFR, Estimated: >60 mL/min (ref >60)
Glucose, Bld: 122 mg/dL — ABNORMAL HIGH (ref 70–99)
Potassium: 3.2 mmol/L — ABNORMAL LOW (ref 3.5–5.1)
Sodium: 143 mmol/L (ref 135–145)

## 2023-10-26 LAB — HEPARIN LEVEL (UNFRACTIONATED)
Heparin Unfractionated: 0.24 [IU]/mL — ABNORMAL LOW (ref 0.30–0.70)
Heparin Unfractionated: <0.1 [IU]/mL — ABNORMAL LOW (ref 0.30–0.70)

## 2023-10-26 LAB — MAGNESIUM: Magnesium: 1.8 mg/dL (ref 1.7–2.4)

## 2023-10-26 LAB — LIPOPROTEIN A (LPA): Lipoprotein (a): 53.1 nmol/L — ABNORMAL HIGH (ref <75.0)

## 2023-10-26 LAB — CK: Total CK: 3795 U/L — ABNORMAL HIGH (ref 38–234)

## 2023-10-26 MED ORDER — SODIUM CHLORIDE 0.9 % IV SOLN
1.0000 g | INTRAVENOUS | Status: DC
  Administered 2023-10-26: 1 g via INTRAVENOUS
  Filled 2023-10-26: qty 10

## 2023-10-26 MED ORDER — LISINOPRIL 10 MG PO TABS
10.0000 mg | ORAL_TABLET | Freq: Every day | ORAL | Status: DC
Start: 2023-10-26 — End: 2023-11-02
  Administered 2023-10-26 – 2023-11-02 (×8): 10 mg via ORAL
  Filled 2023-10-26 (×7): qty 1

## 2023-10-26 MED ORDER — HEPARIN BOLUS VIA INFUSION
2000.0000 [IU] | Freq: Once | INTRAVENOUS | Status: AC
  Administered 2023-10-26: 2000 [IU] via INTRAVENOUS
  Filled 2023-10-26: qty 2000

## 2023-10-26 MED ORDER — AMLODIPINE BESYLATE 5 MG PO TABS
5.0000 mg | ORAL_TABLET | Freq: Every day | ORAL | Status: DC
  Administered 2023-10-26 – 2023-11-02 (×8): 5 mg via ORAL
  Filled 2023-10-26 (×8): qty 1

## 2023-10-26 MED ORDER — IOHEXOL 300 MG/ML  SOLN
100.0000 mL | Freq: Once | INTRAMUSCULAR | Status: AC | PRN
  Administered 2023-10-26: 100 mL via INTRAVENOUS

## 2023-10-26 MED ORDER — HEPARIN BOLUS VIA INFUSION
1000.0000 [IU] | Freq: Once | INTRAVENOUS | Status: AC
  Administered 2023-10-26: 1000 [IU] via INTRAVENOUS
  Filled 2023-10-26: qty 1000

## 2023-10-26 MED ORDER — POTASSIUM CHLORIDE IN NACL 20-0.9 MEQ/L-% IV SOLN
INTRAVENOUS | Status: DC
  Filled 2023-10-26: qty 1000

## 2023-10-26 MED ORDER — MAGNESIUM SULFATE 2 GM/50ML IV SOLN
2.0000 g | Freq: Once | INTRAVENOUS | Status: AC
  Administered 2023-10-26: 2 g via INTRAVENOUS
  Filled 2023-10-26: qty 50

## 2023-10-26 NOTE — ED Notes (Signed)
 Patient had large loose stool, cleaned; new depends, new chux and new gown applied.

## 2023-10-26 NOTE — Progress Notes (Signed)
*  PRELIMINARY RESULTS* Echocardiogram 2D Echocardiogram has been performed.  Joan Baldwin 10/26/2023, 8:34 AM

## 2023-10-26 NOTE — ED Notes (Signed)
 Hep level 1230

## 2023-10-26 NOTE — Progress Notes (Addendum)
 PROGRESS NOTE    [name]  FMW:[id number] DOB: [birth year] DOA: 10/24/2023 PCP: Joan Nancyann BRAVO, MD     Brief Narrative:   Joan Baldwin is a 78 y.o. female with medical history significant for HTN, depression and anxiety, chronic hepatitis C, chronic severe constipation being admitted with sepsis due to possible UTI after presenting with a 1 week history of weakness.  Per EMS family found patient on the floor of the living room.  Patient denies falling but stated she was so weak she had to sit down on the floor.  She denied pain.  Denied nausea, vomiting or abdominal pain, cough, chest pain, fever or chills, change in bowel habits from baseline In the ED Tmax 100.8, tachycardic to 108 tachypneic to 26 with normal blood pressures, O2 sat 100% on room air.Labs notable for WBC 16,000 and lactic acid 3.2 with UA strongly positive for UTI with positive nitrites and large leukocyte esterase with many bacteria. Respiratory viral panel was negative Troponin 658 Glucose 294, creatinine 1.15 (most recent baseline 1.09 about 3 months ago) AST 44 with otherwise normal LFTs Hemoglobin 11.7 down from baseline of 13.2 about 3 months prior, With normal platelets EKG with sinus tachycardia at 106 Chest x-ray nonacute Patient treated with sepsis fluids started on Rocephin  Also started on a heparin infusion due to elevated troponin in the 600s   Assessment & Plan:   Principal Problem:   Sepsis secondary to UTI [facility]) Active Problems:   Elevated troponin   Anxiety   Essential (primary) hypertension   Chronic hepatitis C virus genotype 1a infection (HCC)   Chronic constipation   Sepsis due to urinary tract infection (HCC)  # E. Coli UTI Culture growing e coli. With fever overnight. Denies abd pain - continue ceftriaxone  - follow BC  - follow UC sensitivities - CT today to eval for infectious complications given persistent fever  # Sepsis By fever, tachycardia. Improving -  continue fluids and abx  # Tropinemia Likely demand. On heparin - cardiology following - TTE pending - continuing heparin for now per cardiology  # Rhabdomyolysis Elevated ck may be from being found down, also may be 2/2 above cardiac ischemia. No aki or elevated k. Ck down-trending today - continue fluids  # Hypokalemia - replete - f/u mg and replete if low  # HCV Doesn't appear ever treated - outpt f/u  # Chronic constipation - home amitiza , psyllium  # HTN BPs soft in setting of sepsis - home meds on hold  # Dementia - home seroquel   # GAD - home valium  prn  # Debility PT consult pending   DVT prophylaxis: heparin IV Code Status: full Family Communication: daughter in law at bedside 10/15  Level of care: Telemetry Cardiac Status is: Inpatient Remains inpatient appropriate because: severity of illness    Consultants:  cardiology  Procedures: none  Antimicrobials:  ceftriaxone     Subjective: No complaints other than trouble sleeping last night  Objective: Vitals:   10/26/23 0330 10/26/23 0500 10/26/23 0515 10/26/23 0854  BP: (!) 124/45 (!) 122/50  (!) 154/58  Pulse: 71 69 82 90  Resp: (!) 29 (!) 28 18 18   Temp:   97.9 F (36.6 C) 98.3 F (36.8 C)  TempSrc:   Oral Oral  SpO2: 94% 94% 97% 96%  Weight:      Height:        Intake/Output Summary (Last 24 hours) at 10/26/2023 1015 Last data filed at 10/26/2023 1005 Gross per 24  hour  Intake 2248.56 ml  Output --  Net 2248.56 ml   Filed Weights   10/24/23 2342  Weight: 69.4 kg    Examination:  General exam: Appears calm and comfortable  Respiratory system: Clear to auscultation. Respiratory effort normal. Cardiovascular system: S1 & S2 heard, RRR. No JVD, murmurs, rubs, gallops or clicks.   Gastrointestinal system: Abdomen is nondistended, soft and nontender. No cva tenderness Central nervous system: Alert and oriented to self, moving all 4 Extremities: Symmetric 5 x 5 power. No  edema Skin: No rashes, lesions or ulcers Psychiatry: calm but confused    Data Reviewed: I have personally reviewed following labs and imaging studies  CBC: Recent Labs  Lab 10/24/23 2355 10/25/23 0410 10/26/23 0318  WBC 16.7* 16.3* 7.6  NEUTROABS 15.5*  --   --   HGB 11.7* 10.0* 9.9*  HCT 36.4 30.6* 30.7*  MCV 92.4 91.9 91.6  PLT 285 225 199   Basic Metabolic Panel: Recent Labs  Lab 10/24/23 2355 10/25/23 0410 10/26/23 0318  NA 140 140 143  K 3.6 3.4* 3.2*  CL 107 108 110  CO2 22 24 20*  GLUCOSE 294* 223* 122*  BUN 31* 23 11  CREATININE 1.15* 0.76 0.78  CALCIUM  9.2 8.6* 7.8*   GFR: Estimated Creatinine Clearance: 54.9 mL/min (by C-G formula based on SCr of 0.78 mg/dL). Liver Function Tests: Recent Labs  Lab 10/24/23 2355  AST 44*  ALT 13  ALKPHOS 44  BILITOT 0.5  PROT 6.9  ALBUMIN 3.4*   No results for input(s): LIPASE, AMYLASE in the last 168 hours. No results for input(s): AMMONIA in the last 168 hours. Coagulation Profile: Recent Labs  Lab 10/24/23 2355  INR 1.2   Cardiac Enzymes: Recent Labs  Lab 10/25/23 0410 10/26/23 0318  CKTOTAL 3,987* 3,795*  CKMB  --  4.9   BNP (last 3 results) No results for input(s): PROBNP in the last 8760 hours. HbA1C: No results for input(s): HGBA1C in the last 72 hours. CBG: No results for input(s): GLUCAP in the last 168 hours. Lipid Profile: No results for input(s): CHOL, HDL, LDLCALC, TRIG, CHOLHDL, LDLDIRECT in the last 72 hours. Thyroid  Function Tests: Recent Labs    10/25/23 1014  TSH 0.562   Anemia Panel: Recent Labs    10/24/23 2355  VITAMINB12 479   Urine analysis:    Component Value Date/Time   COLORURINE YELLOW (A) 10/25/2023 0018   APPEARANCEUR CLOUDY (A) 10/25/2023 0018   LABSPEC 1.017 10/25/2023 0018   PHURINE 5.0 10/25/2023 0018   GLUCOSEU NEGATIVE 10/25/2023 0018   HGBUR LARGE (A) 10/25/2023 0018   BILIRUBINUR NEGATIVE 10/25/2023 0018   KETONESUR  NEGATIVE 10/25/2023 0018   PROTEINUR 100 (A) 10/25/2023 0018   NITRITE POSITIVE (A) 10/25/2023 0018   LEUKOCYTESUR LARGE (A) 10/25/2023 0018   Sepsis Labs: @LABRCNTIP (procalcitonin:4,lacticidven:4)  ) Recent Results (from the past 240 hours)  Blood Culture (routine x 2)     Status: None (Preliminary result)   Collection Time: 10/24/23 11:55 PM   Specimen: BLOOD  Result Value Ref Range Status   Specimen Description BLOOD RIGHT FOREARM  Final   Special Requests   Final    BOTTLES DRAWN AEROBIC AND ANAEROBIC Blood Culture adequate volume   Culture   Final    NO GROWTH 1 DAY Performed at [facility], [address] [place], [place] [address]    Report Status PENDING  Incomplete  Resp panel by RT-PCR (RSV, Flu A&B, Covid) Urine, Catheterized  Status: None   Collection Time: 10/24/23 11:55 PM   Specimen: Urine, Catheterized; Nasal Swab  Result Value Ref Range Status   SARS Coronavirus 2 by RT PCR NEGATIVE NEGATIVE Final    Comment: (NOTE) SARS-CoV-2 target nucleic acids are NOT DETECTED.  The SARS-CoV-2 RNA is generally detectable in upper respiratory specimens during the acute phase of infection. The lowest concentration of SARS-CoV-2 viral copies this assay can detect is 138 copies/mL. A negative result does not preclude SARS-Cov-2 infection and should not be used as the sole basis for treatment or other patient management decisions. A negative result may occur with  improper specimen collection/handling, submission of specimen other than nasopharyngeal swab, presence of viral mutation(s) within the areas targeted by this assay, and inadequate number of viral copies(<138 copies/mL). A negative result must be combined with clinical observations, patient history, and epidemiological information. The expected result is Negative.  Fact Sheet for Patients:  BloggerCourse.com  Fact Sheet for Healthcare Providers:   SeriousBroker.it  This test is no t yet approved or cleared by the [place]  FDA and  has been authorized for detection and/or diagnosis of SARS-CoV-2 by FDA under an Emergency Use Authorization (EUA). This EUA will remain  in effect (meaning this test can be used) for the duration of the COVID-19 declaration under Section 564(b)(1) of the Act, 21 U.S.C.section 360bbb-3(b)(1), unless the authorization is terminated  or revoked sooner.       Influenza A by PCR NEGATIVE NEGATIVE Final   Influenza B by PCR NEGATIVE NEGATIVE Final    Comment: (NOTE) The Xpert Xpress SARS-CoV-2/FLU/RSV plus assay is intended as an aid in the diagnosis of influenza from Nasopharyngeal swab specimens and should not be used as a sole basis for treatment. Nasal washings and aspirates are unacceptable for Xpert Xpress SARS-CoV-2/FLU/RSV testing.  Fact Sheet for Patients: BloggerCourse.com  Fact Sheet for Healthcare Providers: SeriousBroker.it  This test is not yet approved or cleared by the [place]  FDA and has been authorized for detection and/or diagnosis of SARS-CoV-2 by FDA under an Emergency Use Authorization (EUA). This EUA will remain in effect (meaning this test can be used) for the duration of the COVID-19 declaration under Section 564(b)(1) of the Act, 21 U.S.C. section 360bbb-3(b)(1), unless the authorization is terminated or revoked.     Resp Syncytial Virus by PCR NEGATIVE NEGATIVE Final    Comment: (NOTE) Fact Sheet for Patients: BloggerCourse.com  Fact Sheet for Healthcare Providers: SeriousBroker.it  This test is not yet approved or cleared by the [place]  FDA and has been authorized for detection and/or diagnosis of SARS-CoV-2 by FDA under an Emergency Use Authorization (EUA). This EUA will remain in effect (meaning this test can be used) for  the duration of the COVID-19 declaration under Section 564(b)(1) of the Act, 21 U.S.C. section 360bbb-3(b)(1), unless the authorization is terminated or revoked.  Performed at [facility], [address], [place], [place] [address]   Urine Culture     Status: Abnormal (Preliminary result)   Collection Time: 10/25/23 12:18 AM   Specimen: Urine, Random  Result Value Ref Range Status   Specimen Description   Final    URINE, RANDOM Performed at [facility], [address], [place], [place] [address]    Special Requests   Final    NONE Reflexed from [phone] Performed at [facility], [address] [place], [place] [address]    Culture (A)  Final    >=100,000 COLONIES/mL GRAM NEGATIVE RODS SUSCEPTIBILITIES  TO FOLLOW Performed at [facility], [address] [place], [place] [address]    Report Status PENDING  Incomplete  Blood Culture (routine x 2)     Status: None (Preliminary result)   Collection Time: 10/25/23 12:48 AM   Specimen: BLOOD  Result Value Ref Range Status   Specimen Description BLOOD BLOOD RIGHT ARM  Final   Special Requests   Final    BOTTLES DRAWN AEROBIC AND ANAEROBIC Blood Culture results may not be optimal due to an inadequate volume of blood received in culture bottles   Culture   Final    NO GROWTH 1 DAY Performed at [facility], [address] [place], [place] [address]    Report Status PENDING  Incomplete         Radiology Studies: DG Chest Port 1 View Result Date: 10/25/2023 EXAM: 1 VIEW(S) XRAY OF THE CHEST 10/25/2023 12:01:37 AM COMPARISON: 11/17/2022 CLINICAL HISTORY: Questionable sepsis - evaluate for abnormality. weakness FINDINGS: LUNGS AND PLEURA: Low lung volumes. No focal pulmonary opacity. No pulmonary edema. No pleural effusion. No pneumothorax. HEART AND MEDIASTINUM: No acute abnormality of the cardiac and mediastinal silhouettes. BONES AND SOFT TISSUES: Left upper quadrant surgical clips  noted. No acute osseous abnormality. IMPRESSION: 1. No acute cardiopulmonary abnormality. Electronically signed by: Pinkie Pebbles MD 10/25/2023 12:09 AM EDT RP Workstation: [id number]        Scheduled Meds:  aspirin  EC  81 mg Oral Daily   lubiprostone   24 mcg Oral BID WC   nicotine  21 mg Transdermal Daily   pantoprazole   40 mg Oral Daily   psyllium  1 packet Oral Daily   QUEtiapine   50 mg Oral QHS   traZODone   100 mg Oral QHS   Continuous Infusions:  cefTRIAXone  (ROCEPHIN )  IV Stopped (10/25/23 2355)   heparin 1,250 Units/hr (10/26/23 0510)     LOS: 1 day     Devaughn KATHEE Ban, MD [facility]   If 7PM-7AM, please contact night-coverage [email or web address] Password [email or web address] 10/26/2023, 10:15 AM

## 2023-10-26 NOTE — ED Notes (Signed)
Assisted pt on and off of bedpan.

## 2023-10-26 NOTE — Progress Notes (Signed)
 PT Cancellation Note  Patient Details Name: Joan Baldwin MRN: [id number] DOB: 1945-12-09   Cancelled Treatment:    Reason Eval/Treat Not Completed: Medical issues which prohibited therapy PT hold evaluation at this time due to persistent elevated troponin levels. PT to assess at later time.    [name] [name] 10/26/2023, 1:07 PM

## 2023-10-26 NOTE — ED Notes (Signed)
 Full linen change and clean gown put on pt. Soiled linen disposed of appropriately. Pt sitting up in bed, call light within reach.

## 2023-10-26 NOTE — Progress Notes (Signed)
 ANTICOAGULATION CONSULT NOTE  Pharmacy Consult for heparin infusion Indication: ACS/NSTEMI  Patient Measurements: Height: 5' 5 (165.1 cm) Weight: 69.4 kg (153 lb) IBW/kg (Calculated) : 57 HEPARIN DW (KG): 69.4  Labs: Recent Labs    10/24/23 2355 10/25/23 0140 10/25/23 0410 10/25/23 1014 10/25/23 1752 10/26/23 0318 10/26/23 1246  HGB 11.7*  --  10.0*  --   --  9.9*  --   HCT 36.4  --  30.6*  --   --  30.7*  --   PLT 285  --  225  --   --  199  --   APTT 28  --   --   --   --   --   --   LABPROT 16.3*  --   --   --   --   --   --   INR 1.2  --   --   --   --   --   --   HEPARINUNFRC  --   --   --    < > <0.10* 0.24* <0.10*  CREATININE 1.15*  --  0.76  --   --  0.78  --   CKTOTAL  --   --  6,012*  --   --  3,795*  --   CKMB  --   --   --   --   --  4.9  --   TROPONINIHS 658* 978*  --   --   --   --   --    < > = values in this interval not displayed.    Estimated Creatinine Clearance: 54.9 mL/min (by C-G formula based on SCr of 0.78 mg/dL).   Medical History: Past Medical History:  Diagnosis Date   Anxiety    can cause palpitations   Balance problems    had brain scan (10/02/19) / clearance given by Dr Gasper   Cataract    Complication of anesthesia    patient state, they have to give me alot to go out during colonoscopy   Depression    GERD (gastroesophageal reflux disease)    Glaucoma    Hepatitis    B w treatment/ been cleared (Dr Lyle records)   History of chicken pox    Hypertension    controlled on meds   Osteoporosis    Sepsis (HCC) 01/17/2016   Wears dentures    upper    Assessment: Pt is a 78 yo female presenting to ED after being found on floor after sitting down d/t increased weakness, found with elevated Troponin I level, and being started on heparin for NSTEMI, possibly due to severe sepsis, but cannot rule out ACS. Patient was not on any AC prior to admission per chart review. Pharmacy has been consulted for heparin dosing.   Goal of  Therapy:  Heparin level 0.3-0.7 units/ml Monitor platelets by anticoagulation protocol: Yes   Date Time HL Rate/Comment  10/14 1014 0.32 Therapeutic x1 @ 900 units/hr 10/14 1752 <0.10 SUBtherapeutic 10/15 0318 0.24 Subtherapeutic 10/15 1246 < 0.10 Subtherapeutic  Plan:  --Heparin 2000 unit IV bolus and increase heparin infusion rate to 1450 units/hr --Re-check HL 8 hours from rate change --Daily CBC per protocol while on IV heparin  Joan Baldwin 10/26/2023 2:12 PM

## 2023-10-26 NOTE — Progress Notes (Signed)
 Rounding Note   Patient Name: Joan Baldwin Date of Encounter: 10/26/2023  [facility] Cardiologist: Lonni Hanson, MD   Subjective Patient reports ongoing weakness and fatigue. No chest pain or shortness of breath. Her hemoglobin is trending down.  Remains on heparin infusion denies chest pain or shortness of breath No family at the bedside  Scheduled Meds:  aspirin  EC  81 mg Oral Daily   lubiprostone   24 mcg Oral BID WC   nicotine  21 mg Transdermal Daily   pantoprazole   40 mg Oral Daily   psyllium  1 packet Oral Daily   QUEtiapine   50 mg Oral QHS   traZODone   100 mg Oral QHS   Continuous Infusions:  0.9 % NaCl with KCl 20 mEq / L 100 mL/hr at 10/26/23 1135   cefTRIAXone  (ROCEPHIN )  IV Stopped (10/25/23 2355)   heparin 1,250 Units/hr (10/26/23 0510)   PRN Meds: acetaminophen , diazepam , menthol, nitroGLYCERIN, ondansetron  (ZOFRAN ) IV   Vital Signs  Vitals:   10/26/23 0500 10/26/23 0515 10/26/23 0854 10/26/23 1030  BP: (!) 122/50  (!) 154/58 (!) 160/70  Pulse: 69 82 90 87  Resp: (!) 28 18 18 20   Temp:  97.9 F (36.6 C) 98.3 F (36.8 C)   TempSrc:  Oral Oral   SpO2: 94% 97% 96% 96%  Weight:      Height:        Intake/Output Summary (Last 24 hours) at 10/26/2023 1145 Last data filed at 10/26/2023 1005 Gross per 24 hour  Intake 2248.56 ml  Output --  Net 2248.56 ml      10/24/2023   11:42 PM 08/12/2023   11:33 AM 06/17/2023    2:13 PM  Last 3 Weights  Weight (lbs) 153 lb 147 lb 138 lb  Weight (kg) 69.4 kg 66.679 kg 62.596 kg      Telemetry Normal sinus rhythm- Personally Reviewed  Physical Exam  GEN: No acute distress.  Appears frail, lying supine in bed Neck: No JVD Cardiac: RRR, no murmurs, rubs, or gallops.  Respiratory: Clear to auscultation bilaterally. GI: Soft, nontender, non-distended  MS: No edema; No deformity. Neuro:  Nonfocal  Psych: Normal affect   Labs High Sensitivity Troponin:   Recent Labs  Lab 10/24/23 2355  10/25/23 0140  TROPONINIHS 658* 978*     Chemistry Recent Labs  Lab 10/24/23 2355 10/25/23 0410 10/26/23 0318  NA 140 140 143  K 3.6 3.4* 3.2*  CL 107 108 110  CO2 22 24 20*  GLUCOSE 294* 223* 122*  BUN 31* 23 11  CREATININE 1.15* 0.76 0.78  CALCIUM  9.2 8.6* 7.8*  PROT 6.9  --   --   ALBUMIN 3.4*  --   --   AST 44*  --   --   ALT 13  --   --   ALKPHOS 44  --   --   BILITOT 0.5  --   --   GFRNONAA 48* >60 >60  ANIONGAP 11 8 13     Lipids No results for input(s): CHOL, TRIG, HDL, LABVLDL, LDLCALC, CHOLHDL in the last 168 hours.  Hematology Recent Labs  Lab 10/24/23 2355 10/25/23 0410 10/26/23 0318  WBC 16.7* 16.3* 7.6  RBC 3.94 3.33* 3.35*  HGB 11.7* 10.0* 9.9*  HCT 36.4 30.6* 30.7*  MCV 92.4 91.9 91.6  MCH 29.7 30.0 29.6  MCHC 32.1 32.7 32.2  RDW 12.1 12.2 12.1  PLT 285 225 199   Thyroid   Recent Labs  Lab 10/25/23 1014  TSH 0.562    BNPNo results for input(s): BNP, PROBNP in the last 168 hours.  DDimer No results for input(s): DDIMER in the last 168 hours.   Radiology  CT ABDOMEN PELVIS W CONTRAST Result Date: 10/26/2023 IMPRESSION: 1. Multiple simple-appearing cysts in the left kidney with an area of diminished attenuation and mild adjacent fatty stranding. Findings may represent focal pyelonephritis 2. Mild diffuse thickening of the wall of the sigmoid colon. Electronically signed by: Evalene Coho MD 10/26/2023 11:21 AM EDT RP Workstation: [id number]   DG Chest Port 1 View Result Date: 10/25/2023 IMPRESSION: 1. No acute cardiopulmonary abnormality. Electronically signed by: Pinkie Pebbles MD 10/25/2023 12:09 AM EDT RP Workstation: [id number]    Cardiac Studies  10/26/2023 Echo complete   Patient Profile   78 y.o. female  with a hx of hypertension, depression, anxiety, chronic hepatitis C, and chronic constipation who was admitted 10/14 after being found down by family who is being seen for the management of elevated  troponin.   Assessment & Plan  Non-STEMI Troponin up to 978, has not been checked since October 14 In the setting of UTI sepsis Denies symptoms of chest pain concerning for angina, appears comfortable -On heparin IV infusion, plan for 48 hours Given frailty, age, sepsis, no cardiac symptoms, normal ejection fraction, not a good candidate for ischemic workup at this time -Will plan on full recovery from her UTI sepsis, outpatient follow-up in clinic and discussion whether she would like ischemic workup  Sepsis due to UTI Presenting with altered mental status, found down by family, component of rhabdo with elevated CK -Prelim urine culture with E. Coli - On antibiotics Improving mentation  Essential hypertension Blood pressure elevated 150 up to 160 systolic As outpatient taking amlodipine  5 daily lisinopril  10 daily Order placed to restart outpatient medications  For questions or updates, please contact [facility] Please consult [email or web address] for contact info under     Signed, Velinda Lunger, MD, Ph.D [facility]

## 2023-10-26 NOTE — ED Notes (Addendum)
 This tech placed pt on bedpan. This tech and NT [place] assisted pt off of bedpan and provided peri care. Pt was repositioned on bed and provided with 2 warm blankets.

## 2023-10-26 NOTE — Progress Notes (Signed)
 ANTICOAGULATION CONSULT NOTE  Pharmacy Consult for heparin infusion Indication: ACS/NSTEMI  Allergies  Allergen Reactions   Gabapentin Itching and Rash    blister  blisters   Oxcarbazepine Other (See Comments)    Double vision   Fluoxetine      Makes her feel weird and have tremors   Tramadol Hcl Other (See Comments)    Insomnia   Ultram  [Tramadol Hcl]     Insomnia   Codeine Itching and Rash    Patient Measurements: Height: 5' 5 (165.1 cm) Weight: 69.4 kg (153 lb) IBW/kg (Calculated) : 57 HEPARIN DW (KG): 69.4  Vital Signs: Temp: 99.2 F (37.3 C) (10/15 0051) Temp Source: Oral (10/15 0051) BP: 124/45 (10/15 0330) Pulse Rate: 71 (10/15 0330)  Labs: Recent Labs    10/24/23 2355 10/25/23 0140 10/25/23 0410 10/25/23 1014 10/25/23 1752 10/26/23 0318  HGB 11.7*  --  10.0*  --   --  9.9*  HCT 36.4  --  30.6*  --   --  30.7*  PLT 285  --  225  --   --  199  APTT 28  --   --   --   --   --   LABPROT 16.3*  --   --   --   --   --   INR 1.2  --   --   --   --   --   HEPARINUNFRC  --   --   --  0.32 <0.10* 0.24*  CREATININE 1.15*  --  0.76  --   --   --   CKTOTAL  --   --  3,987*  --   --   --   TROPONINIHS 658* 978*  --   --   --   --     Estimated Creatinine Clearance: 54.9 mL/min (by C-G formula based on SCr of 0.76 mg/dL).   Medical History: Past Medical History:  Diagnosis Date   Anxiety    can cause palpitations   Balance problems    had brain scan (10/02/19) / clearance given by Dr Gasper   Cataract    Complication of anesthesia    patient state, they have to give me alot to go out during colonoscopy   Depression    GERD (gastroesophageal reflux disease)    Glaucoma    Hepatitis    B w treatment/ been cleared (Dr Lyle records)   History of chicken pox    Hypertension    controlled on meds   Osteoporosis    Sepsis (HCC) 01/17/2016   Wears dentures    upper    Assessment: Pt is a 78 yo female presenting to ED after being found on floor  after sitting down d/t increased weakness, found with elevated Troponin I level, and being started on heparin for NSTEMI, possibly due to severe sepsis, but cannot rule out ACS. Patient was not on any AC prior to admission per chart review. Pharmacy has been consulted for heparin dosing.   Goal of Therapy:  Heparin level 0.3-0.7 units/ml Monitor platelets by anticoagulation protocol: Yes   Date Time HL Rate/Comment  10/14 1014 0.32 Therapeutic x1 @ 900 units/hr 10/14 1752 <0.10 SUBtherapeutic 10/15 0318 0.24 Subtherapeutic  Plan:  - Bolus 1000 units x 1 - Will increase heparin infusion to 1250 units/hr - Will recheck HL in 8 hr after rate change - CBC daily while on heparin  Rankin CANDIE Dills, PharmD, [facility] 10/26/2023 4:07 AM

## 2023-10-26 NOTE — Progress Notes (Signed)
*  PRELIMINARY RESULTS* Echocardiogram 2D Echocardiogram has been performed.  Floydene Harder 10/26/2023, 8:34 AM

## 2023-10-26 NOTE — ED Notes (Signed)
 Pt had a bowel movement, pt changed into clean brief. Soiled brief disposed of appropriately. Pt sitting up in bed, call light within reach.

## 2023-10-27 DIAGNOSIS — I2489 Other forms of acute ischemic heart disease: Secondary | ICD-10-CM | POA: Diagnosis not present

## 2023-10-27 DIAGNOSIS — A419 Sepsis, unspecified organism: Secondary | ICD-10-CM | POA: Diagnosis not present

## 2023-10-27 DIAGNOSIS — F419 Anxiety disorder, unspecified: Secondary | ICD-10-CM

## 2023-10-27 DIAGNOSIS — R531 Weakness: Secondary | ICD-10-CM | POA: Diagnosis not present

## 2023-10-27 DIAGNOSIS — I214 Non-ST elevation (NSTEMI) myocardial infarction: Secondary | ICD-10-CM | POA: Diagnosis not present

## 2023-10-27 LAB — BLOOD CULTURE ID PANEL (REFLEXED) - BCID2

## 2023-10-27 LAB — CREATININE, SERUM
Creatinine, Ser: 0.81 mg/dL (ref 0.44–1.00)
GFR, Estimated: >60 mL/min (ref >60)

## 2023-10-27 LAB — BASIC METABOLIC PANEL WITH GFR
Anion gap: 13 (ref 5–15)
BUN: 9 mg/dL (ref 8–23)
CO2: 22 mmol/L (ref 22–32)
Calcium: 7.6 mg/dL — ABNORMAL LOW (ref 8.9–10.3)
Chloride: 107 mmol/L (ref 98–111)
Creatinine, Ser: 0.92 mg/dL (ref 0.44–1.00)
GFR, Estimated: >60 mL/min (ref >60)
Glucose, Bld: 137 mg/dL — ABNORMAL HIGH (ref 70–99)
Potassium: 3.4 mmol/L — ABNORMAL LOW (ref 3.5–5.1)
Sodium: 142 mmol/L (ref 135–145)

## 2023-10-27 LAB — CBC
HCT: 30.4 % — ABNORMAL LOW (ref 36.0–46.0)
HCT: 31 % — ABNORMAL LOW (ref 36.0–46.0)
Hemoglobin: 9.5 g/dL — ABNORMAL LOW (ref 12.0–15.0)
Hemoglobin: 9.9 g/dL — ABNORMAL LOW (ref 12.0–15.0)
MCH: 29.3 pg (ref 26.0–34.0)
MCH: 29.3 pg (ref 26.0–34.0)
MCHC: 31.3 g/dL (ref 30.0–36.0)
MCHC: 31.9 g/dL (ref 30.0–36.0)
MCV: 93.8 fL (ref 80.0–100.0)
MCV: 91.7 fL (ref 80.0–100.0)
Platelets: 208 K/uL (ref 150–400)
Platelets: 204 K/uL (ref 150–400)
RBC: 3.24 MIL/uL — ABNORMAL LOW (ref 3.87–5.11)
RBC: 3.38 MIL/uL — ABNORMAL LOW (ref 3.87–5.11)
RDW: 11.9 % (ref 11.5–15.5)
RDW: 12 % (ref 11.5–15.5)
WBC: 6.1 K/uL (ref 4.0–10.5)
WBC: 5.6 K/uL (ref 4.0–10.5)
nRBC: 0 % (ref 0.0–0.2)
nRBC: 0 % (ref 0.0–0.2)

## 2023-10-27 LAB — URINE CULTURE: Culture: >=100000 — AB

## 2023-10-27 LAB — HEPARIN LEVEL (UNFRACTIONATED)
Heparin Unfractionated: <0.1 [IU]/mL — ABNORMAL LOW (ref 0.30–0.70)
Heparin Unfractionated: 0.47 [IU]/mL (ref 0.30–0.70)

## 2023-10-27 MED ORDER — HEPARIN SODIUM (PORCINE) 5000 UNIT/ML IJ SOLN
5000.0000 [IU] | Freq: Three times a day (TID) | INTRAMUSCULAR | Status: DC
  Filled 2023-10-27: qty 1

## 2023-10-27 MED ORDER — HEPARIN SODIUM (PORCINE) 5000 UNIT/ML IJ SOLN
5000.0000 [IU] | Freq: Three times a day (TID) | INTRAMUSCULAR | Status: DC
  Administered 2023-10-27 – 2023-11-02 (×18): 5000 [IU] via SUBCUTANEOUS
  Filled 2023-10-27 (×16): qty 1

## 2023-10-27 MED ORDER — LISINOPRIL 10 MG PO TABS: 10.0000 mg | ORAL_TABLET | Freq: Every day | ORAL | 0 refills | Status: DC

## 2023-10-27 MED ORDER — SODIUM CHLORIDE 0.9 % IV SOLN
2.0000 g | INTRAVENOUS | Status: DC
  Administered 2023-10-27 – 2023-10-30 (×4): 2 g via INTRAVENOUS
  Filled 2023-10-27 (×5): qty 20

## 2023-10-27 MED ORDER — POTASSIUM CHLORIDE CRYS ER 20 MEQ PO TBCR
40.0000 meq | EXTENDED_RELEASE_TABLET | Freq: Two times a day (BID) | ORAL | Status: AC
  Administered 2023-10-27 (×2): 40 meq via ORAL
  Filled 2023-10-27 (×2): qty 2

## 2023-10-27 MED ORDER — CEPHALEXIN 500 MG PO CAPS: 500.0000 mg | ORAL_CAPSULE | Freq: Two times a day (BID) | ORAL | 0 refills | Status: DC

## 2023-10-27 NOTE — Evaluation (Signed)
 Physical Therapy Evaluation Patient Details Name: Joan Baldwin MRN: [id number] DOB: 17-Dec-1945 Today's Date: 10/27/2023  History of Present Illness  Joan Baldwin is a 78 y.o. female with medical history significant for HTN, depression and anxiety, chronic hepatitis C, chronic severe constipation being admitted with sepsis due to possible UTI after presenting with a 1 week history of weakness.  Per EMS family found patient on the floor of the living room.  Patient denies falling but stated she was so weak she had to sit down on the floor.  She denied pain.  Denied nausea, vomiting or abdominal pain, cough, chest pain, fever or chills, change in bowel habits from baseline  In the ED Tmax 100.8, tachycardic to 108 tachypneic to 26 with normal blood pressures, O2 sat 100% on room air.Labs notable for WBC 16,000 and lactic acid 3.2 with UA strongly positive for UTI with positive nitrites and large leukocyte esterase with many bacteria.  Respiratory viral panel was negative  Clinical Impression  Patient seen for initial PT evaluation due to decline in functional status  Patient is A&O x 4. Baseline mobility reported as independent, currently requiring modA for bed mobility and TotalA after complete LOB after taking 3 steps with RW at bedside. Pt demonstrated significant weakness with all OOB mobility. Pt lives with son and daughter and law who the pt mentioned is available to provide assistance if need. Pt has 3 steps to enter home with railing on L side.     Clinical impression: patient presents with severe mobility limitations secondary to progressive weakness and medical complications. Recommend skilled acute level PT to address safety, mobility, and discharge planning. PT recommends SNF at d/c.       If plan is discharge home, recommend the following: Two people to help with walking and/or transfers;Two people to help with bathing/dressing/bathroom   Can travel by private vehicle   No     Equipment Recommendations    Recommendations for Other Services       Functional Status Assessment Patient has had a recent decline in their functional status and/or demonstrates limited ability to make significant improvements in function in a reasonable and predictable amount of time     Precautions / Restrictions Precautions Precautions: Fall Restrictions Weight Bearing Restrictions Per Provider Order: No      Mobility  Bed Mobility Overal bed mobility: Needs Assistance Bed Mobility: Rolling, Sidelying to Sit Rolling: Mod assist Sidelying to sit: Mod assist       General bed mobility comments: vc for hand placement and movement sequence    Transfers Overall transfer level: Needs assistance Equipment used: 1 person hand held assist Transfers: Sit to/from Stand Sit to Stand: Mod assist                Ambulation/Gait Ambulation/Gait assistance: Max assist, Total assist Gait Distance (Feet): 3 Feet Assistive device: Rolling walker (2 wheels) Gait Pattern/deviations: Step-to pattern, Drifts right/left, Trunk flexed, Narrow base of support, Shuffle, Knees buckling, Decreased stride length       General Gait Details: attempted gait with RW; Pt. mod assist to start and regressed to Total with complete LOB (pt. did not hit the floor, was caught by therapist); pt needed total physical assistance back to bed  Stairs            Wheelchair Mobility     Tilt Bed    Modified Rankin (Stroke Patients Only)       Balance Overall balance assessment: Needs assistance Sitting-balance  support: Feet supported Sitting balance-Leahy Scale: Fair       Standing balance-Leahy Scale: Poor Standing balance comment: bilateral lower extremity trembling during static standing                             Pertinent Vitals/Pain Pain Assessment Pain Assessment: No/denies pain    Home Living Family/patient expects to be discharged to:: Private  residence Living Arrangements: Children (daugther and law and son) Available Help at Discharge: Family;Available 24 hours/day;Available PRN/intermittently Type of Home: Mobile home Home Access: Stairs to enter Entrance Stairs-Rails: Left;Can reach both Entrance Stairs-Number of Steps: 3   Home Layout: One level Home Equipment: Shower seat - built in;Grab bars - tub/shower;Rolling Environmental consultant (2 wheels);Cane - single point      Prior Function Prior Level of Function : Independent/Modified Independent             Mobility Comments: PLOF independent       Extremity/Trunk Assessment        Lower Extremity Assessment Lower Extremity Assessment: Generalized weakness    Cervical / Trunk Assessment Cervical / Trunk Assessment: Normal  Communication   Communication Communication: No apparent difficulties    Cognition Arousal: Alert Behavior During Therapy: WFL for tasks assessed/performed   PT - Cognitive impairments: No apparent impairments                         Following commands: Intact       Cueing Cueing Techniques: Verbal cues     General Comments      Exercises     Assessment/Plan    PT Assessment Patient needs continued PT services  PT Problem List Decreased strength;Decreased activity tolerance;Decreased mobility       PT Treatment Interventions Functional mobility training;Therapeutic activities;Therapeutic exercise;Neuromuscular re-education;Patient/family education    PT Goals (Current goals can be found in the Care Plan section)  Acute Rehab PT Goals Patient Stated Goal: pt. wants to get stronger PT Goal Formulation: With patient Time For Goal Achievement: 11/10/23 Potential to Achieve Goals: Fair    Frequency Min 3X/week     Co-evaluation               AM-PAC PT 6 Clicks Mobility  Outcome Measure Help needed turning from your back to your side while in a flat bed without using bedrails?: A Little Help needed moving  from lying on your back to sitting on the side of a flat bed without using bedrails?: A Little Help needed moving to and from a bed to a chair (including a wheelchair)?: A Lot Help needed standing up from a chair using your arms (e.g., wheelchair or bedside chair)?: A Lot Help needed to walk in hospital room?: Total Help needed climbing 3-5 steps with a railing? : Total 6 Click Score: 12    End of Session Equipment Utilized During Treatment: Gait belt Activity Tolerance: Patient limited by fatigue Patient left: in bed;with bed alarm set Nurse Communication: Mobility status PT Visit Diagnosis: Unsteadiness on feet (R26.81);Muscle weakness (generalized) (M62.81)    Time: 0140-0210 PT Time Calculation (min) (ACUTE ONLY): 30 min   Charges:   PT Evaluation $PT Eval Moderate Complexity: 1 Mod   PT General Charges $$ ACUTE PT VISIT: 1 Visit         Joan Baldwin DPT, PT    Joan Baldwin 10/27/2023, 2:31 PM

## 2023-10-27 NOTE — TOC Initial Note (Signed)
 Transition of Care [facility]) - Initial/Assessment Note    Patient Details  Name: Joan Baldwin MRN: [id number] Date of Birth: January 12, 1946  Transition of [facility]) CM/SW Contact:    [place] L Keyleen Cerrato, LCSW Phone Number: 10/27/2023, 2:42 PM  Clinical Narrative:                    TOC received fax from [employer] regarding patient appeal. Patient is in the ED and can not appeal discharge. Documentation was sent to [employer] as a response to the information they requested.   CSW sent secure chat to attending who advised that patient is not medically ready for discharge.      Patient Goals and CMS Choice            Expected Discharge Plan and Services                                              Prior Living Arrangements/Services                       Activities of Daily Living      Permission Sought/Granted                  Emotional Assessment              Admission diagnosis:  No admission diagnoses are documented for this encounter. Patient Active Problem List   Diagnosis Date Noted   Severe sepsis (HCC) 10/26/2023   Demand ischemia (HCC) 10/26/2023   Generalized weakness 10/26/2023   NSTEMI (non-ST elevated myocardial infarction) (HCC) 10/26/2023   Urinary tract infection without hematuria 10/26/2023   Sepsis secondary to UTI (HCC) 10/25/2023   Elevated troponin 10/25/2023   Chronic constipation 10/25/2023   MDD (major depressive disorder), recurrent episode, with atypical features 02/10/2021   Major depression, recurrent, chronic 02/06/2021   Closed fracture of shaft of ulna 11/17/2020   Chronic hepatitis C virus genotype 1a infection (HCC) 06/01/2019   Hepatitis C virus infection without hepatic coma 09/03/2018   Chronic dental pain 08/30/2018   History of adenomatous polyp of colon 12/29/2016   History of smoking 30 or more pack years 05/26/2016   Glaucoma 11/22/2014   Skin lesion 11/22/2014   Osteoporosis 02/13/2013    Pre-diabetes 08/10/2009   Essential (primary) hypertension 05/06/2006   Anxiety 01/11/2001   Insomnia 01/11/2001   GERD (gastroesophageal reflux disease) 01/11/2001   PCP:  Gasper Nancyann BRAVO, MD Pharmacy:   [facility] [address] [place] - [address] [place] [address] Phone: [phone] Fax: [phone]  [facility] [address], [place] - [address] [place] [address] Phone: [phone] Fax: [phone]     Social Drivers of Health (SDOH) Social History: SDOH Screenings   Food Insecurity: No Food Insecurity (06/22/2023)  Housing: Unknown (06/22/2023)  Transportation Needs: No Transportation Needs (06/22/2023)  Utilities: Not At Risk (06/22/2023)  Alcohol Screen: Low Risk  (06/22/2023)  Depression (PHQ2-9): Low Risk  (06/22/2023)  Recent Concern: Depression (PHQ2-9) - Medium Risk (06/17/2023)  Financial Resource Strain: Low Risk  (06/22/2023)  Physical Activity: Insufficiently Active (06/22/2023)  Social Connections: Moderately Isolated (06/22/2023)  Stress: No Stress Concern Present (06/22/2023)  Tobacco Use: Medium Risk (10/24/2023)  Health Literacy: Adequate Health Literacy (06/22/2023)   SDOH Interventions:  Readmission Risk Interventions     No data to display

## 2023-10-27 NOTE — TOC Initial Note (Addendum)
 Transition of Care [facility]) - Initial/Assessment Note    Patient Details  Name: Joan Baldwin MRN: [id number] Date of Birth: 1945-10-04  Transition of Care [facility]) CM/SW Contact:    [place] L Brentney Goldbach, LCSW Phone Number: 10/27/2023, 3:34 PM  Clinical Narrative:                    TOC received fax from [employer] regarding patient appeal. Patient is in the ED and can not appeal discharge. Documentation was sent to [employer] as a response to the information they requested.   CSW sent secure chat to attending who advised that patient is not medically ready for discharge.   3:46pm: CSW met with patient. No family at bedside. CSW provided patient with a copy of her appeal letter from [facility]. CSW advised that discharge from the ED can not be appealed. Patient asked CSW to share this information with her daughter. She also asked CSW to communicate with daughter that patient would like her to come to the hospital.   4:06pm: CSW spoke with Ms. Jackolyn, daughter in law. She stated that patient gave her what seems to be the [facility] letter. She stated patient was panicking and advised her to appeal discharge. Ms. Jackolyn stated that there is some confusion because patient was not advised that she was being discharged.      Patient Goals and CMS Choice            Expected Discharge Plan and Services         Expected Discharge Date: 10/27/23                                    Prior Living Arrangements/Services                       Activities of Daily Living      Permission Sought/Granted                  Emotional Assessment              Admission diagnosis:  Sepsis secondary to UTI (HCC) [A41.9, N39.0] Sepsis due to urinary tract infection (HCC) [A41.9, N39.0] Patient Active Problem List   Diagnosis Date Noted   Severe sepsis (HCC) 10/26/2023   Demand ischemia (HCC) 10/26/2023   Generalized weakness 10/26/2023   NSTEMI (non-ST elevated myocardial infarction)  (HCC) 10/26/2023   Urinary tract infection without hematuria 10/26/2023   Sepsis secondary to UTI (HCC) 10/25/2023   Elevated troponin 10/25/2023   Chronic constipation 10/25/2023   MDD (major depressive disorder), recurrent episode, with atypical features 02/10/2021   Major depression, recurrent, chronic 02/06/2021   Closed fracture of shaft of ulna 11/17/2020   Chronic hepatitis C virus genotype 1a infection (HCC) 06/01/2019   Hepatitis C virus infection without hepatic coma 09/03/2018   Chronic dental pain 08/30/2018   History of adenomatous polyp of colon 12/29/2016   History of smoking 30 or more pack years 05/26/2016   Glaucoma 11/22/2014   Skin lesion 11/22/2014   Osteoporosis 02/13/2013   Pre-diabetes 08/10/2009   Essential (primary) hypertension 05/06/2006   Anxiety 01/11/2001   Insomnia 01/11/2001   GERD (gastroesophageal reflux disease) 01/11/2001   PCP:  Gasper Nancyann BRAVO, MD Pharmacy:   [facility] [address], [place] - [address] [place] [address] Phone: [phone] Fax: [phone]  [facility] [phone] -  [place], [place] - [address] [place] [address] Phone: [phone] Fax: [phone]     Social Drivers of Health (SDOH) Social History: SDOH Screenings   Food Insecurity: No Food Insecurity (06/22/2023)  Housing: Unknown (06/22/2023)  Transportation Needs: No Transportation Needs (06/22/2023)  Utilities: Not At Risk (06/22/2023)  Alcohol Screen: Low Risk  (06/22/2023)  Depression (PHQ2-9): Low Risk  (06/22/2023)  Recent Concern: Depression (PHQ2-9) - Medium Risk (06/17/2023)  Financial Resource Strain: Low Risk  (06/22/2023)  Physical Activity: Insufficiently Active (06/22/2023)  Social Connections: Moderately Isolated (06/22/2023)  Stress: No Stress Concern Present (06/22/2023)  Tobacco Use: Medium Risk (10/24/2023)  Health Literacy: Adequate Health Literacy (06/22/2023)   SDOH Interventions:      Readmission Risk Interventions     No data to display

## 2023-10-27 NOTE — ED Notes (Signed)
 Assisted pt to the side of the bed and set up breakfast tray and emptied pure wick canister, pt is eating breakfast with no needs at this time

## 2023-10-27 NOTE — ED Notes (Signed)
 This tech cleaned the pt and placed new purewick

## 2023-10-27 NOTE — Discharge Summary (Incomplete)
 Physician Discharge Summary  Patient: Joan Baldwin FMW:[id number] DOB: [birth year]   Code Status: Full Code Admit date: 10/24/2023 Discharge date: 10/27/2023 Disposition: Home, No home health services recommended PCP: Gasper Nancyann BRAVO, MD  Recommendations for Outpatient Follow-up:  Follow up with PCP within 1-2 weeks Regarding general hospital follow up and preventative care Recommend *** ***  Discharge Diagnoses:  Principal Problem:   Sepsis secondary to UTI [facility]) Active Problems:   Elevated troponin   Anxiety   Essential (primary) hypertension   Chronic hepatitis C virus genotype 1a infection (HCC)   Chronic constipation   Severe sepsis (HCC)   Demand ischemia (HCC)   Generalized weakness   NSTEMI (non-ST elevated myocardial infarction) (HCC)   Urinary tract infection without hematuria  Brief Hospital Course Summary: ***  All other chronic conditions were treated with home medications.    Discharge Condition: {DISCHARGE CONDITION:19696}, improved Recommended discharge diet: {Discharge Ipzu:695039817}  Consultations: ***  Procedures/Studies: ***   Allergies as of 10/27/2023       Reactions   Gabapentin Itching, Rash   blister blisters   Oxcarbazepine Other (See Comments)   Double vision   Fluoxetine     Makes her feel weird and have tremors   Tramadol Hcl Other (See Comments)   Insomnia   Ultram  [tramadol Hcl]    Insomnia   Codeine Itching, Rash        Medication List     STOP taking these medications    FLUoxetine  20 MG capsule Commonly known as: PROZAC    lactulose  10 GM/15ML solution Commonly known as: CHRONULAC    lidocaine  2 % solution Commonly known as: XYLOCAINE    terbinafine  250 MG tablet Commonly known as: LAMISIL        TAKE these medications    alendronate  70 MG tablet Commonly known as: FOSAMAX  TAKE 1 TABLET BY MOUTH EVERY 7 DAYS. TAKE WITH A FULL GLASS OF WATER ON EMPTY STOMACH   amLODipine  5 MG tablet Commonly  known as: NORVASC  Take 1 tablet by mouth once daily   Calcium  Carb-Cholecalciferol 600-12.5 MG-MCG Caps Take 1 tablet by mouth daily.   Centrum Silver tablet   cephALEXin 500 MG capsule Commonly known as: KEFLEX Take 1 capsule (500 mg total) by mouth 2 (two) times daily for 2 days.   diazepam  10 MG tablet Commonly known as: VALIUM  TAKE 1/2 TO 1 (ONE-HALF TO ONE) TABLET BY MOUTH 4 TIMES DAILY AS NEEDED FOR ANXIETY What changed:  how much to take how to take this when to take this   Fiber Powd Take 2 scoop by mouth daily.   lisinopril  10 MG tablet Commonly known as: ZESTRIL  Take 1 tablet (10 mg total) by mouth daily.   lubiprostone  24 MCG capsule Commonly known as: AMITIZA  Take 1 capsule (24 mcg total) by mouth 2 (two) times daily with a meal.   omeprazole  20 MG capsule Commonly known as: PRILOSEC Take 1 capsule by mouth once daily   QUEtiapine  50 MG tablet Commonly known as: SEROQUEL  TAKE 1 TABLET BY MOUTH AT BEDTIME   traZODone  100 MG tablet Commonly known as: DESYREL  TAKE 1 TABLET BY MOUTH AT BEDTIME         Subjective   Pt reports ***  All questions and concerns were addressed at time of discharge.  Objective  Blood pressure (!) 128/59, pulse 76, temperature 98 F (36.7 C), temperature source Oral, resp. rate (!) 21, height 5' 5 (1.651 m), weight 69.4 kg, SpO2 100%.   General: Pt is  alert, awake, not in acute distress Cardiovascular: RRR, S1/S2 +, no rubs, no gallops Respiratory: CTA bilaterally, no wheezing, no rhonchi Abdominal: Soft, NT, ND, bowel sounds + Extremities: no edema, no cyanosis  The results of significant diagnostics from this hospitalization (including imaging, microbiology, ancillary and laboratory) are listed below for reference.   Imaging studies: ECHOCARDIOGRAM COMPLETE Result Date: 10/26/2023    ECHOCARDIOGRAM REPORT   Patient Name:   Joan Baldwin Date of Exam: 10/26/2023 Medical Rec #:  [id number]         Height:        65.0 in Accession #:    [id number]        Weight:       153.0 lb Date of Birth:  1945/04/07         BSA:          1.765 m Patient Age:    78 years          BP:           122/50 mmHg Patient Gender: F                 HR:           82 bpm. Exam Location:  [facility] Procedure: 2D Echo, Cardiac Doppler and Color Doppler (Both Spectral and Color            Flow Doppler were utilized during procedure). Indications:     Elevated troponin  History:         Patient has no prior history of Echocardiogram examinations.                  Risk Factors:Hypertension. Anxiety.  Sonographer:     Christopher Furnace Referring Phys:  [id number] DELAYNE LULLA SOLIAN Diagnosing Phys: Evalene Lunger MD IMPRESSIONS  1. Left ventricular ejection fraction, by estimation, is 60 to 65%. The left ventricle has normal function. The left ventricle has no regional wall motion abnormalities. There is mild left ventricular hypertrophy. Left ventricular diastolic parameters are consistent with Grade I diastolic dysfunction (impaired relaxation).  2. Right ventricular systolic function is normal. The right ventricular size is normal. There is normal pulmonary artery systolic pressure. The estimated right ventricular systolic pressure is 18.4 mmHg.  3. The mitral valve is normal in structure. No evidence of mitral valve regurgitation. No evidence of mitral stenosis.  4. The aortic valve is normal in structure. Aortic valve regurgitation is not visualized. Aortic valve sclerosis is present, with no evidence of aortic valve stenosis.  5. The inferior vena cava is normal in size with greater than 50% respiratory variability, suggesting right atrial pressure of 3 mmHg. FINDINGS  Left Ventricle: Left ventricular ejection fraction, by estimation, is 60 to 65%. The left ventricle has normal function. The left ventricle has no regional wall motion abnormalities. Strain was performed and the global longitudinal strain is indeterminate. The left ventricular internal cavity size was  normal in size. There is mild left ventricular hypertrophy. Left ventricular diastolic parameters are consistent with Grade I diastolic dysfunction (impaired relaxation). Right Ventricle: The right ventricular size is normal. No increase in right ventricular wall thickness. Right ventricular systolic function is normal. There is normal pulmonary artery systolic pressure. The tricuspid regurgitant velocity is 1.83 m/s, and  with an assumed right atrial pressure of 5 mmHg, the estimated right ventricular systolic pressure is 18.4 mmHg. Left Atrium: Left atrial size was normal in size. Right Atrium: Right atrial size was normal in size. Pericardium: There  is no evidence of pericardial effusion. Mitral Valve: The mitral valve is normal in structure. No evidence of mitral valve regurgitation. No evidence of mitral valve stenosis. MV peak gradient, 6.9 mmHg. The mean mitral valve gradient is 4.0 mmHg. Tricuspid Valve: The tricuspid valve is normal in structure. Tricuspid valve regurgitation is mild . No evidence of tricuspid stenosis. Aortic Valve: The aortic valve is normal in structure. Aortic valve regurgitation is not visualized. Aortic valve sclerosis is present, with no evidence of aortic valve stenosis. Aortic valve mean gradient measures 6.0 mmHg. Aortic valve peak gradient measures 8.9 mmHg. Aortic valve area, by VTI measures 2.68 cm. Pulmonic Valve: The pulmonic valve was normal in structure. Pulmonic valve regurgitation is not visualized. No evidence of pulmonic stenosis. Aorta: The aortic root is normal in size and structure. Venous: The inferior vena cava is normal in size with greater than 50% respiratory variability, suggesting right atrial pressure of 3 mmHg. IAS/Shunts: No atrial level shunt detected by color flow Doppler. Additional Comments: 3D was performed not requiring image post processing on an independent workstation and was indeterminate.  LEFT VENTRICLE PLAX 2D LVIDd:         3.00 cm    Diastology LVIDs:         1.90 cm   LV e' medial:    6.85 cm/s LV PW:         0.80 cm   LV E/e' medial:  13.9 LV IVS:        1.00 cm   LV e' lateral:   7.83 cm/s LVOT diam:     2.00 cm   LV E/e' lateral: 12.1 LV SV:         75 LV SV Index:   43 LVOT Area:     3.14 cm  RIGHT VENTRICLE RV Basal diam:  3.30 cm RV Mid diam:    3.30 cm RV S prime:     12.40 cm/s TAPSE (M-mode): 1.3 cm LEFT ATRIUM             Index        RIGHT ATRIUM          Index LA diam:        3.40 cm 1.93 cm/m   RA Area:     9.70 cm LA Vol (A2C):   29.2 ml 16.54 ml/m  RA Volume:   18.30 ml 10.37 ml/m LA Vol (A4C):   30.9 ml 17.50 ml/m LA Biplane Vol: 31.5 ml 17.84 ml/m  AORTIC VALVE AV Area (Vmax):    2.72 cm AV Area (Vmean):   2.73 cm AV Area (VTI):     2.68 cm AV Vmax:           149.00 cm/s AV Vmean:          113.000 cm/s AV VTI:            0.281 m AV Peak Grad:      8.9 mmHg AV Mean Grad:      6.0 mmHg LVOT Vmax:         129.00 cm/s LVOT Vmean:        98.100 cm/s LVOT VTI:          0.240 m LVOT/AV VTI ratio: 0.85  AORTA Ao Root diam: 2.30 cm MITRAL VALVE                TRICUSPID VALVE MV Area (PHT): 2.79 cm     TR Peak grad:   13.4 mmHg MV  Area VTI:   2.27 cm     TR Vmax:        183.00 cm/s MV Peak grad:  6.9 mmHg MV Mean grad:  4.0 mmHg     SHUNTS MV Vmax:       1.31 m/s     Systemic VTI:  0.24 m MV Vmean:      94.2 cm/s    Systemic Diam: 2.00 cm MV Decel Time: 272 msec MV E velocity: 94.90 cm/s MV A velocity: 125.00 cm/s MV E/A ratio:  0.76 Evalene Lunger MD Electronically signed by Evalene Lunger MD Signature Date/Time: 10/26/2023/1:19:24 PM    Final    CT ABDOMEN PELVIS W CONTRAST Result Date: 10/26/2023 EXAM: CT ABDOMEN AND PELVIS WITH CONTRAST 10/26/2023 10:58:37 AM TECHNIQUE: CT of the abdomen and pelvis was performed with the administration of 100 mL of iohexol  (OMNIPAQUE ) 300 MG/ML solution. Multiplanar reformatted images are provided for review. Automated exposure control, iterative reconstruction, and/or weight-based  adjustment of the mA/kV was utilized to reduce the radiation dose to as low as reasonably achievable. COMPARISON: CT of the abdomen and pelvis dated 02/14/2022. CLINICAL HISTORY: Complicated UTI. Patient arrives from home via [facility], family stated that patient was found on floor of living room. Per patient she did not fall, she states that she sat down on floor due to increased weakness for 1 week. Patient A\T\Ox4. NAD. Denies pain. Per EMS ST 118-120, non diabetic with CBG of 343. T 100.4 AX. FINDINGS: LOWER CHEST: There is mild bronchiectasis within the lower lobes bilaterally. There is also mild dependent atelectasis slightly worse on the right. There is a trace right-sided pleural effusion. LIVER: The liver is unremarkable. GALLBLADDER AND BILE DUCTS: Gallbladder is unremarkable. No biliary ductal dilatation. SPLEEN: Surgical clips are seen along the ventral surface of the spleen. PANCREAS: No acute abnormality. ADRENAL GLANDS: No acute abnormality. KIDNEYS, URETERS AND BLADDER: There are multiple simple-appearing cysts present within the left kidney. There is an area of diminished attenuation present anteriorly within the superior pole and there is mild adjacent fatty stranding. There are small simple cysts within the right kidney, which is otherwise unremarkable. Per consensus, no follow-up is needed for simple Bosniak type 1 and 2 renal cysts, unless the patient has a malignancy history or risk factors. No stones in the kidneys or ureters. No hydronephrosis. No perinephric or periureteral stranding. Urinary bladder is unremarkable. GI AND BOWEL: The patient is status post gastric surgery. There is mild diffuse thickening of the wall of the sigmoid colon. The small bowel is unremarkable. There is no bowel obstruction. PERITONEUM AND RETROPERITONEUM: No ascites. No free air. VASCULATURE: The abdominal aorta is normal in caliber, but demonstrates dense calcific atheromatous disease. LYMPH NODES: No  lymphadenopathy. REPRODUCTIVE ORGANS: The patient is status post hysterectomy and probable right salpingo-oophorectomy. The left ovary is unremarkable. BONES AND SOFT TISSUES: No acute osseous abnormality. No focal soft tissue abnormality. IMPRESSION: 1. Multiple simple-appearing cysts in the left kidney with an area of diminished attenuation and mild adjacent fatty stranding. Findings may represent focal pyelonephritis 2. Mild diffuse thickening of the wall of the sigmoid colon. Electronically signed by: Evalene Coho MD 10/26/2023 11:21 AM EDT RP Workstation: [id number]   DG Chest Port 1 View Result Date: 10/25/2023 EXAM: 1 VIEW(S) XRAY OF THE CHEST 10/25/2023 12:01:37 AM COMPARISON: 11/17/2022 CLINICAL HISTORY: Questionable sepsis - evaluate for abnormality. weakness FINDINGS: LUNGS AND PLEURA: Low lung volumes. No focal pulmonary opacity. No pulmonary edema. No pleural effusion. No pneumothorax. HEART AND  MEDIASTINUM: No acute abnormality of the cardiac and mediastinal silhouettes. BONES AND SOFT TISSUES: Left upper quadrant surgical clips noted. No acute osseous abnormality. IMPRESSION: 1. No acute cardiopulmonary abnormality. Electronically signed by: Pinkie Pebbles MD 10/25/2023 12:09 AM EDT RP Workstation: [id number]    Labs: Basic Metabolic Panel: Recent Labs  Lab 10/24/23 2355 10/25/23 0410 10/26/23 0318 10/26/23 1246 10/27/23 0026 10/27/23 1045  NA 140 140 143  --  142  --   K 3.6 3.4* 3.2*  --  3.4*  --   CL 107 108 110  --  107  --   CO2 22 24 20*  --  22  --   GLUCOSE 294* 223* 122*  --  137*  --   BUN 31* 23 11  --  9  --   CREATININE 1.15* 0.76 0.78  --  0.92 0.81  CALCIUM  9.2 8.6* 7.8*  --  7.6*  --   MG  --   --   --  1.8  --   --    CBC: Recent Labs  Lab 10/24/23 2355 10/25/23 0410 10/26/23 0318 10/27/23 0026 10/27/23 1045  WBC 16.7* 16.3* 7.6 6.1 5.6  NEUTROABS 15.5*  --   --   --   --   HGB 11.7* 10.0* 9.9* 9.5* 9.9*  HCT 36.4 30.6* 30.7* 30.4* 31.0*   MCV 92.4 91.9 91.6 93.8 91.7  PLT 285 225 199 208 204   Microbiology: Results for orders placed or performed during the hospital encounter of 10/24/23  Blood Culture (routine x 2)     Status: None (Preliminary result)   Collection Time: 10/24/23 11:55 PM   Specimen: BLOOD  Result Value Ref Range Status   Specimen Description BLOOD RIGHT FOREARM  Final   Special Requests   Final    BOTTLES DRAWN AEROBIC AND ANAEROBIC Blood Culture adequate volume   Culture  Setup Time   Final    GRAM NEGATIVE RODS ANAEROBIC BOTTLE ONLY Organism ID to follow    Culture   Final    NO GROWTH 2 DAYS Performed at [facility], [address] [place], [place] [address]    Report Status PENDING  Incomplete  Resp panel by RT-PCR (RSV, Flu A&B, Covid) Urine, Catheterized     Status: None   Collection Time: 10/24/23 11:55 PM   Specimen: Urine, Catheterized; Nasal Swab  Result Value Ref Range Status   SARS Coronavirus 2 by RT PCR NEGATIVE NEGATIVE Final    Comment: (NOTE) SARS-CoV-2 target nucleic acids are NOT DETECTED.  The SARS-CoV-2 RNA is generally detectable in upper respiratory specimens during the acute phase of infection. The lowest concentration of SARS-CoV-2 viral copies this assay can detect is 138 copies/mL. A negative result does not preclude SARS-Cov-2 infection and should not be used as the sole basis for treatment or other patient management decisions. A negative result may occur with  improper specimen collection/handling, submission of specimen other than nasopharyngeal swab, presence of viral mutation(s) within the areas targeted by this assay, and inadequate number of viral copies(<138 copies/mL). A negative result must be combined with clinical observations, patient history, and epidemiological information. The expected result is Negative.  Fact Sheet for Patients:  BloggerCourse.com  Fact Sheet for Healthcare Providers:   SeriousBroker.it  This test is no t yet approved or cleared by the [place]  FDA and  has been authorized for detection and/or diagnosis of SARS-CoV-2 by FDA under an Emergency Use Authorization (EUA). This EUA will remain  in effect (meaning this test can be used) for the duration of the COVID-19 declaration under Section 564(b)(1) of the Act, 21 U.S.C.section 360bbb-3(b)(1), unless the authorization is terminated  or revoked sooner.       Influenza A by PCR NEGATIVE NEGATIVE Final   Influenza B by PCR NEGATIVE NEGATIVE Final    Comment: (NOTE) The Xpert Xpress SARS-CoV-2/FLU/RSV plus assay is intended as an aid in the diagnosis of influenza from Nasopharyngeal swab specimens and should not be used as a sole basis for treatment. Nasal washings and aspirates are unacceptable for Xpert Xpress SARS-CoV-2/FLU/RSV testing.  Fact Sheet for Patients: BloggerCourse.com  Fact Sheet for Healthcare Providers: SeriousBroker.it  This test is not yet approved or cleared by the [place]  FDA and has been authorized for detection and/or diagnosis of SARS-CoV-2 by FDA under an Emergency Use Authorization (EUA). This EUA will remain in effect (meaning this test can be used) for the duration of the COVID-19 declaration under Section 564(b)(1) of the Act, 21 U.S.C. section 360bbb-3(b)(1), unless the authorization is terminated or revoked.     Resp Syncytial Virus by PCR NEGATIVE NEGATIVE Final    Comment: (NOTE) Fact Sheet for Patients: BloggerCourse.com  Fact Sheet for Healthcare Providers: SeriousBroker.it  This test is not yet approved or cleared by the [place]  FDA and has been authorized for detection and/or diagnosis of SARS-CoV-2 by FDA under an Emergency Use Authorization (EUA). This EUA will remain in effect (meaning this test can be used) for  the duration of the COVID-19 declaration under Section 564(b)(1) of the Act, 21 U.S.C. section 360bbb-3(b)(1), unless the authorization is terminated or revoked.  Performed at [facility], [address], [place], [place] [address]   Urine Culture     Status: Abnormal   Collection Time: 10/25/23 12:18 AM   Specimen: Urine, Random  Result Value Ref Range Status   Specimen Description   Final    URINE, RANDOM Performed at [facility], [address] [place], [place] [address]    Special Requests   Final    NONE Reflexed from [phone] Performed at [facility], [address] [place], [place] [address]    Culture >=100,000 COLONIES/mL ESCHERICHIA COLI (A)  Final   Report Status 10/27/2023 FINAL  Final   Organism ID, Bacteria ESCHERICHIA COLI (A)  Final      Susceptibility   Escherichia coli - MIC*    AMPICILLIN >=32 RESISTANT Resistant     CEFAZOLIN (URINE) Value in next row Sensitive      4 SENSITIVEThis is a modified FDA-approved test that has been validated and its performance characteristics determined by the reporting laboratory.  This laboratory is certified under the Clinical Laboratory Improvement Amendments CLIA as qualified to perform high complexity clinical laboratory testing.    CEFEPIME Value in next row Sensitive      4 SENSITIVEThis is a modified FDA-approved test that has been validated and its performance characteristics determined by the reporting laboratory.  This laboratory is certified under the Clinical Laboratory Improvement Amendments CLIA as qualified to perform high complexity clinical laboratory testing.    ERTAPENEM Value in next row Sensitive      4 SENSITIVEThis is a modified FDA-approved test that has been validated and its performance characteristics determined by the reporting laboratory.  This laboratory is certified under the Clinical Laboratory Improvement Amendments CLIA as qualified to perform high complexity clinical  laboratory testing.    CEFTRIAXONE  Value in next row Sensitive  4 SENSITIVEThis is a modified FDA-approved test that has been validated and its performance characteristics determined by the reporting laboratory.  This laboratory is certified under the Clinical Laboratory Improvement Amendments CLIA as qualified to perform high complexity clinical laboratory testing.    CIPROFLOXACIN Value in next row Sensitive      4 SENSITIVEThis is a modified FDA-approved test that has been validated and its performance characteristics determined by the reporting laboratory.  This laboratory is certified under the Clinical Laboratory Improvement Amendments CLIA as qualified to perform high complexity clinical laboratory testing.    GENTAMICIN Value in next row Sensitive      4 SENSITIVEThis is a modified FDA-approved test that has been validated and its performance characteristics determined by the reporting laboratory.  This laboratory is certified under the Clinical Laboratory Improvement Amendments CLIA as qualified to perform high complexity clinical laboratory testing.    NITROFURANTOIN Value in next row Sensitive      4 SENSITIVEThis is a modified FDA-approved test that has been validated and its performance characteristics determined by the reporting laboratory.  This laboratory is certified under the Clinical Laboratory Improvement Amendments CLIA as qualified to perform high complexity clinical laboratory testing.    TRIMETH/SULFA Value in next row Sensitive      4 SENSITIVEThis is a modified FDA-approved test that has been validated and its performance characteristics determined by the reporting laboratory.  This laboratory is certified under the Clinical Laboratory Improvement Amendments CLIA as qualified to perform high complexity clinical laboratory testing.    AMPICILLIN/SULBACTAM Value in next row Resistant      4 SENSITIVEThis is a modified FDA-approved test that has been validated and its  performance characteristics determined by the reporting laboratory.  This laboratory is certified under the Clinical Laboratory Improvement Amendments CLIA as qualified to perform high complexity clinical laboratory testing.    PIP/TAZO Value in next row Sensitive      <=4 SENSITIVEThis is a modified FDA-approved test that has been validated and its performance characteristics determined by the reporting laboratory.  This laboratory is certified under the Clinical Laboratory Improvement Amendments CLIA as qualified to perform high complexity clinical laboratory testing.    MEROPENEM Value in next row Sensitive      <=4 SENSITIVEThis is a modified FDA-approved test that has been validated and its performance characteristics determined by the reporting laboratory.  This laboratory is certified under the Clinical Laboratory Improvement Amendments CLIA as qualified to perform high complexity clinical laboratory testing.    * >=100,000 COLONIES/mL ESCHERICHIA COLI  Blood Culture (routine x 2)     Status: None (Preliminary result)   Collection Time: 10/25/23 12:48 AM   Specimen: BLOOD  Result Value Ref Range Status   Specimen Description BLOOD BLOOD RIGHT ARM  Final   Special Requests   Final    BOTTLES DRAWN AEROBIC AND ANAEROBIC Blood Culture results may not be optimal due to an inadequate volume of blood received in culture bottles   Culture   Final    NO GROWTH 2 DAYS Performed at [facility], [address] [place], [place] [address]    Report Status PENDING  Incomplete    Time coordinating discharge: Over 30 minutes  Marien LITTIE Piety, MD  [facility] 10/27/2023, 12:39 PM

## 2023-10-27 NOTE — Progress Notes (Signed)
 ANTICOAGULATION CONSULT NOTE  Pharmacy Consult for heparin infusion Indication: ACS/NSTEMI  Patient Measurements: Height: 5' 5 (165.1 cm) Weight: 69.4 kg (153 lb) IBW/kg (Calculated) : 57 HEPARIN DW (KG): 69.4  Labs: Recent Labs    10/24/23 2355 10/25/23 0140 10/25/23 0410 10/25/23 1014 10/26/23 0318 10/26/23 1246 10/27/23 0026  HGB 11.7*  --  10.0*  --  9.9*  --  9.5*  HCT 36.4  --  30.6*  --  30.7*  --  30.4*  PLT 285  --  225  --  199  --  208  APTT 28  --   --   --   --   --   --   LABPROT 16.3*  --   --   --   --   --   --   INR 1.2  --   --   --   --   --   --   HEPARINUNFRC  --   --   --    < > 0.24* <0.10* <0.10*  CREATININE 1.15*  --  0.76  --  0.78  --  0.92  CKTOTAL  --   --  6,012*  --  3,795*  --   --   CKMB  --   --   --   --  4.9  --   --   TROPONINIHS 658* 978*  --   --   --   --   --    < > = values in this interval not displayed.    Estimated Creatinine Clearance: 47.7 mL/min (by C-G formula based on SCr of 0.92 mg/dL).   Medical History: Past Medical History:  Diagnosis Date   Anxiety    can cause palpitations   Balance problems    had brain scan (10/02/19) / clearance given by Dr Gasper   Cataract    Complication of anesthesia    patient state, they have to give me alot to go out during colonoscopy   Depression    GERD (gastroesophageal reflux disease)    Glaucoma    Hepatitis    B w treatment/ been cleared (Dr Lyle records)   History of chicken pox    Hypertension    controlled on meds   Osteoporosis    Sepsis (HCC) 01/17/2016   Wears dentures    upper    Assessment: Pt is a 78 yo female presenting to ED after being found on floor after sitting down d/t increased weakness, found with elevated Troponin I level, and being started on heparin for NSTEMI, possibly due to severe sepsis, but cannot rule out ACS. Patient was not on any AC prior to admission per chart review. Pharmacy has been consulted for heparin dosing.   Goal of  Therapy:  Heparin level 0.3-0.7 units/ml Monitor platelets by anticoagulation protocol: Yes   Date Time HL Rate/Comment  10/14 1014 0.32 Therapeutic x1 @ 900 units/hr 10/14 1752 <0.10 SUBtherapeutic 10/15 0318 0.24 Subtherapeutic 10/15 1246 < 0.10 Subtherapeutic 10/16 0026 < 0.10 Subtherapeutic - pharmacy notified by RN that heparin line had infiltrated from unknown length of time.  New line placed and heparin restarted ~0030.  Continued with current rate and rechecked HL with AM labs. 10/16 0453 0.47 Therapeutic x 1  Plan:  --Continue heparin infusion rate at 1450 units/hr --Re-check HL 8 hours to confirm --Daily CBC per protocol while on IV heparin  Rankin CANDIE Dills, PharmD, [facility] 10/27/2023 2:19 AM

## 2023-10-27 NOTE — Progress Notes (Signed)
 PROGRESS NOTE OANH DEVIVO  FMW:[id number] DOB: 10/16/1945 DOA: 10/24/2023 PCP: Gasper Nancyann BRAVO, MD  Brief Narrative:  Joan Baldwin is a 78 y.o. female with medical history significant for HTN, depression and anxiety, chronic hepatitis C, chronic severe constipation. She was brought in after being found on ground from weakness but denies injuries or LOC.  In the ED Tmax 100.8, tachycardic to 108 tachypneic to 26 with normal blood pressures, O2 sat 100% on room air. Labs notable for WBC 16,000 and lactic acid 3.2 with UA strongly positive for UTI with positive nitrites and large leukocyte esterase with many bacteria. Respiratory viral panel was negative Troponin 658. Glucose 294, creatinine 1.15 (most recent baseline 1.09 about 3 months ago) AST 44 with otherwise normal LFTs Hemoglobin 11.7 down from baseline of 13.2 about 3 months prior, With normal platelets EKG with sinus tachycardia at 106 Chest x-ray nonacute Patient treated with sepsis fluids started on Rocephin  for UTI Also started on a heparin infusion due to elevated troponin in the 600s- Cardiology was consulted and recommended 48hr heparin gtt and no intervention needed at this time. She is chest pain free.   10/27/23- BxCx return positive for Ecoli, same as UxCx PT eval recommending SNF  Assessment & Plan:   Principal Problem:   Sepsis secondary to UTI [facility]) Active Problems:   Elevated troponin   Anxiety   Essential (primary) hypertension   Chronic hepatitis C virus genotype 1a infection (HCC)   Chronic constipation   Severe sepsis (HCC)   Demand ischemia (HCC)   Generalized weakness   NSTEMI (non-ST elevated myocardial infarction) (HCC)   Urinary tract infection without hematuria  Sepsis  E. Coli UTI with pyelonephritis  E. Coli bacteremia- Cx frowing E. Coli, sensitivities pending. CT abdomen shows multiple cysts in L kidney consistent with pyelonephritis.  - continue ceftriaxone  - monitor fever  curve  Elevated troponins- Likely demand. TTE g1dd, EF 60-65%. Denies chest pain - completed 48 heparin gtt per cardiology recommendations  - cardiology following, and signed off. appreciate your recs  # Rhabdomyolysis Elevated ck may be from being found down, also may be 2/2 above cardiac ischemia. No aki or elevated k. Ck down-trending - Encourage PO hydration   # Hypokalemia - replete - f/u mg and replete if low  # HCV Doesn't appear ever treated - outpt f/u  # Chronic constipation - home amitiza , psyllium  # HTN BPs soft in setting of sepsis - home meds on hold  # Dementia - home seroquel   # GAD - home valium  prn  # Debility PT recommending SNF  DVT prophylaxis: heparin IV Code Status: full Family Communication: daughter in law at bedside 10/15  Level of care: Telemetry Cardiac Status is: Inpatient Remains inpatient appropriate because: severity of illness  Consultants:  cardiology  Procedures: none  Antimicrobials:  ceftriaxone    Subjective: Pt reports feeling better. Denies chest pain. Endorses good UOP with mild discomfort.   Objective: Vitals:   10/27/23 0557 10/27/23 0630 10/27/23 0700 10/27/23 0730  BP: (!) 122/55 (!) 129/52 (!) 127/53 (!) 126/52  Pulse: 82 76 72 71  Resp: 18 (!) 33 (!) 22 (!) 29  Temp: 98.6 F (37 C)     TempSrc: Oral     SpO2: 98% 94% 97% 95%  Weight:      Height:        Intake/Output Summary (Last 24 hours) at 10/27/2023 0816 Last data filed at 10/26/2023 2010 Gross per 24 hour  Intake 2001.22  ml  Output 200 ml  Net 1801.22 ml   Filed Weights   10/24/23 2342  Weight: 69.4 kg    Examination: General exam: Appears calm and comfortable  Respiratory system: Clear to auscultation. Respiratory effort normal. Cardiovascular system: S1 & S2 heard, RRR. No JVD, murmurs, rubs, gallops or clicks.   Gastrointestinal system: Abdomen is nondistended, soft and nontender. No suprapubic tenderness Central nervous  system: Alert and oriented to self, moving all 4 Extremities: Symmetric 5 x 5 power. No edema Skin: No rashes, lesions or ulcers Psychiatry: calm but confused  Data Reviewed: I have personally reviewed following labs and imaging studies  CBC: Recent Labs  Lab 10/24/23 2355 10/25/23 0410 10/26/23 0318 10/27/23 0026  WBC 16.7* 16.3* 7.6 6.1  NEUTROABS 15.5*  --   --   --   HGB 11.7* 10.0* 9.9* 9.5*  HCT 36.4 30.6* 30.7* 30.4*  MCV 92.4 91.9 91.6 93.8  PLT 285 225 199 208   Basic Metabolic Panel: Recent Labs  Lab 10/24/23 2355 10/25/23 0410 10/26/23 0318 10/26/23 1246 10/27/23 0026  NA 140 140 143  --  142  K 3.6 3.4* 3.2*  --  3.4*  CL 107 108 110  --  107  CO2 22 24 20*  --  22  GLUCOSE 294* 223* 122*  --  137*  BUN 31* 23 11  --  9  CREATININE 1.15* 0.76 0.78  --  0.92  CALCIUM  9.2 8.6* 7.8*  --  7.6*  MG  --   --   --  1.8  --     LOS: 2 days   Joan LITTIE Piety, MD [facility]   If 7PM-7AM, please contact night-coverage [email or web address] Password [email or web address] 10/27/2023, 8:16 AM

## 2023-10-27 NOTE — Progress Notes (Signed)
 Rounding Note   Patient Name: Joan Baldwin Date of Encounter: 10/27/2023  [facility] Cardiologist: Lonni Hanson, MD   Subjective Reports I have not slept in two nights. No chest pain or dyspnea. Hgb down trending from 11.7 at admission to 9.5 currently. Potassium remains low at 3.4, repletion ordered. BP stable.   Scheduled Meds:  amLODipine   5 mg Oral Daily   aspirin  EC  81 mg Oral Daily   lisinopril   10 mg Oral Daily   lubiprostone   24 mcg Oral BID WC   nicotine  21 mg Transdermal Daily   pantoprazole   40 mg Oral Daily   potassium chloride  40 mEq Oral BID   psyllium  1 packet Oral Daily   QUEtiapine   50 mg Oral QHS   traZODone   100 mg Oral QHS   Continuous Infusions:  cefTRIAXone  (ROCEPHIN )  IV Stopped (10/27/23 0011)   heparin 1,450 Units/hr (10/26/23 1448)   PRN Meds: acetaminophen , diazepam , menthol, nitroGLYCERIN, ondansetron  (ZOFRAN ) IV   Vital Signs  Vitals:   10/27/23 0557 10/27/23 0630 10/27/23 0700 10/27/23 0730  BP: (!) 122/55 (!) 129/52 (!) 127/53 (!) 126/52  Pulse: 82 76 72 71  Resp: 18 (!) 33 (!) 22 (!) 29  Temp: 98.6 F (37 C)     TempSrc: Oral     SpO2: 98% 94% 97% 95%  Weight:      Height:        Intake/Output Summary (Last 24 hours) at 10/27/2023 0821 Last data filed at 10/26/2023 2010 Gross per 24 hour  Intake 2001.22 ml  Output 200 ml  Net 1801.22 ml      10/24/2023   11:42 PM 08/12/2023   11:33 AM 06/17/2023    2:13 PM  Last 3 Weights  Weight (lbs) 153 lb 147 lb 138 lb  Weight (kg) 69.4 kg 66.679 kg 62.596 kg      Telemetry Normal sinus rhythm, 70s bpm - Personally Reviewed  Physical Exam  GEN: No acute distress.  Appears frail, lying supine in bed Neck: No JVD Cardiac: RRR, no murmurs, rubs, or gallops.  Respiratory: Clear to auscultation bilaterally. GI: Soft, nontender, non-distended  MS: No edema; No deformity. Neuro:  Nonfocal  Psych: Normal affect   Labs High Sensitivity Troponin:   Recent  Labs  Lab 10/24/23 2355 10/25/23 0140  TROPONINIHS 658* 978*     Chemistry Recent Labs  Lab 10/24/23 2355 10/25/23 0410 10/26/23 0318 10/26/23 1246 10/27/23 0026  NA 140 140 143  --  142  K 3.6 3.4* 3.2*  --  3.4*  CL 107 108 110  --  107  CO2 22 24 20*  --  22  GLUCOSE 294* 223* 122*  --  137*  BUN 31* 23 11  --  9  CREATININE 1.15* 0.76 0.78  --  0.92  CALCIUM  9.2 8.6* 7.8*  --  7.6*  MG  --   --   --  1.8  --   PROT 6.9  --   --   --   --   ALBUMIN 3.4*  --   --   --   --   AST 44*  --   --   --   --   ALT 13  --   --   --   --   ALKPHOS 44  --   --   --   --   BILITOT 0.5  --   --   --   --  GFRNONAA 48* >60 >60  --  >60  ANIONGAP 11 8 13   --  13    Lipids No results for input(s): CHOL, TRIG, HDL, LABVLDL, LDLCALC, CHOLHDL in the last 168 hours.  Hematology Recent Labs  Lab 10/25/23 0410 10/26/23 0318 10/27/23 0026  WBC 16.3* 7.6 6.1  RBC 3.33* 3.35* 3.24*  HGB 10.0* 9.9* 9.5*  HCT 30.6* 30.7* 30.4*  MCV 91.9 91.6 93.8  MCH 30.0 29.6 29.3  MCHC 32.7 32.2 31.3  RDW 12.2 12.1 11.9  PLT 225 199 208   Thyroid   Recent Labs  Lab 10/25/23 1014  TSH 0.562    BNPNo results for input(s): BNP, PROBNP in the last 168 hours.  DDimer No results for input(s): DDIMER in the last 168 hours.   Radiology  CT ABDOMEN PELVIS W CONTRAST Result Date: 10/26/2023 IMPRESSION: 1. Multiple simple-appearing cysts in the left kidney with an area of diminished attenuation and mild adjacent fatty stranding. Findings may represent focal pyelonephritis 2. Mild diffuse thickening of the wall of the sigmoid colon. Electronically signed by: Evalene Coho MD 10/26/2023 11:21 AM EDT RP Workstation: [id number]   DG Chest Port 1 View Result Date: 10/25/2023 IMPRESSION: 1. No acute cardiopulmonary abnormality. Electronically signed by: Pinkie Pebbles MD 10/25/2023 12:09 AM EDT RP Workstation: [id number]    Cardiac Studies  2D echo 10/26/2023: 1. Left  ventricular ejection fraction, by estimation, is 60 to 65%. The  left ventricle has normal function. The left ventricle has no regional  wall motion abnormalities. There is mild left ventricular hypertrophy.  Left ventricular diastolic parameters  are consistent with Grade I diastolic dysfunction (impaired relaxation).   2. Right ventricular systolic function is normal. The right ventricular  size is normal. There is normal pulmonary artery systolic pressure. The  estimated right ventricular systolic pressure is 18.4 mmHg.   3. The mitral valve is normal in structure. No evidence of mitral valve  regurgitation. No evidence of mitral stenosis.   4. The aortic valve is normal in structure. Aortic valve regurgitation is  not visualized. Aortic valve sclerosis is present, with no evidence of  aortic valve stenosis.   5. The inferior vena cava is normal in size with greater than 50%  respiratory variability, suggesting right atrial pressure of 3 mmHg.    Patient Profile   78 y.o. female  with a hx of hypertension, depression, anxiety, chronic hepatitis C, and chronic constipation who was admitted 10/14 after being found down by family who is being seen for the management of elevated troponin.   Assessment & Plan  Non-STEMI -Troponin up to 978 -In the setting of UTI sepsis -Denies symptoms of chest pain concerning for angina -Echo with preserved LV systolic function  -Treated with IV heparin, will stop with down trending Hgb and has completed 48 hours -Given frailty, age, sepsis, no cardiac symptoms, normal ejection fraction, not a good candidate for ischemic workup at this time -Will plan on full recovery from her UTI sepsis, outpatient follow-up in clinic and discussion whether she would like ischemic workup  Sepsis due to UTI -Presenting with altered mental status, found down by family, component of rhabdo with elevated CK -Per IM  Essential hypertension -Blood pressure currently  controlled -Amlodipine  5 mg and lisinopril  10 mg  Anemia -Denies symptoms of bleeding -Hgb trending from 11.7 to 9.5 -Stop heparin gtt as above -Per IM  Hypokalemia: -Repletion odered  For questions or updates, please contact [facility] Please consult [email or web address] for contact info under  Signed, Velinda Lunger, MD, Ph.D [facility]

## 2023-10-28 DIAGNOSIS — B9620 Unspecified Escherichia coli [E. coli] as the cause of diseases classified elsewhere: Secondary | ICD-10-CM | POA: Diagnosis not present

## 2023-10-28 DIAGNOSIS — N390 Urinary tract infection, site not specified: Secondary | ICD-10-CM | POA: Diagnosis not present

## 2023-10-28 DIAGNOSIS — A419 Sepsis, unspecified organism: Secondary | ICD-10-CM | POA: Diagnosis not present

## 2023-10-28 LAB — BASIC METABOLIC PANEL WITH GFR
Anion gap: 7 (ref 5–15)
BUN: 11 mg/dL (ref 8–23)
CO2: 24 mmol/L (ref 22–32)
Calcium: 8.1 mg/dL — ABNORMAL LOW (ref 8.9–10.3)
Chloride: 111 mmol/L (ref 98–111)
Creatinine, Ser: 1.08 mg/dL — ABNORMAL HIGH (ref 0.44–1.00)
GFR, Estimated: 52 mL/min — ABNORMAL LOW (ref >60)
Glucose, Bld: 105 mg/dL — ABNORMAL HIGH (ref 70–99)
Potassium: 5.3 mmol/L — ABNORMAL HIGH (ref 3.5–5.1)
Sodium: 142 mmol/L (ref 135–145)

## 2023-10-28 LAB — CBC
HCT: 34 % — ABNORMAL LOW (ref 36.0–46.0)
Hemoglobin: 10.7 g/dL — ABNORMAL LOW (ref 12.0–15.0)
MCH: 29.2 pg (ref 26.0–34.0)
MCHC: 31.5 g/dL (ref 30.0–36.0)
MCV: 92.6 fL (ref 80.0–100.0)
Platelets: 274 K/uL (ref 150–400)
RBC: 3.67 MIL/uL — ABNORMAL LOW (ref 3.87–5.11)
RDW: 11.9 % (ref 11.5–15.5)
WBC: 8.2 K/uL (ref 4.0–10.5)
nRBC: 0 % (ref 0.0–0.2)

## 2023-10-28 NOTE — Progress Notes (Signed)
 Calling out multiple times saying she is cold but also that people keep coming in waking her up and can't get sleep. Oral temp checked and it is 98.6. Room temp increased. Two extra blankets provided. One regular and one from the blanket warmer. Pt still not satisfied with interventions. The room temp increased again and maxed out to 80 degrees via thermostat. Explained to pt that she has had an increased body temp earlier and these interventions may cause her temp to increase again. States.SABRASABRAFever be damned, I am cold

## 2023-10-28 NOTE — NC FL2 (Signed)
 [place]  MEDICAID FL2 LEVEL OF CARE FORM     IDENTIFICATION  Patient Name: Joan Baldwin Birthdate: 10/03/1945 Sex: female Admission Date (Current Location): 10/24/2023  [place] and [place] Number:  Chiropodist and Address:  [facility], [address] [place], [place] [address]      Provider Number: [id number]  Attending Physician Name and Address:  Lenon Marien CROME, MD  Relative Name and Phone Number:  Hosey Sprague [phone]    Current Level of Care: Hospital Recommended Level of Care: Skilled Nursing Facility Prior Approval Number:    Date Approved/Denied:   PASRR Number: Pending Review  Discharge Plan: SNF    Current Diagnoses: Patient Active Problem List   Diagnosis Date Noted   Severe sepsis (HCC) 10/26/2023   Demand ischemia (HCC) 10/26/2023   Generalized weakness 10/26/2023   NSTEMI (non-ST elevated myocardial infarction) (HCC) 10/26/2023   Urinary tract infection without hematuria 10/26/2023   Sepsis secondary to UTI (HCC) 10/25/2023   Elevated troponin 10/25/2023   Chronic constipation 10/25/2023   MDD (major depressive disorder), recurrent episode, with atypical features 02/10/2021   Major depression, recurrent, chronic 02/06/2021   Closed fracture of shaft of ulna 11/17/2020   Chronic hepatitis C virus genotype 1a infection (HCC) 06/01/2019   Hepatitis C virus infection without hepatic coma 09/03/2018   Chronic dental pain 08/30/2018   History of adenomatous polyp of colon 12/29/2016   History of smoking 30 or more pack years 05/26/2016   Glaucoma 11/22/2014   Skin lesion 11/22/2014   Osteoporosis 02/13/2013   Pre-diabetes 08/10/2009   Essential (primary) hypertension 05/06/2006   Anxiety 01/11/2001   Insomnia 01/11/2001   GERD (gastroesophageal reflux disease) 01/11/2001    Orientation RESPIRATION BLADDER Height & Weight     Self, Time, Situation, Place  Normal Continent Weight: 153 lb (69.4  kg) Height:  5' 5 (165.1 cm)  BEHAVIORAL SYMPTOMS/MOOD NEUROLOGICAL BOWEL NUTRITION STATUS      Continent Diet (DIET DYS 3 Fluid consistency: Thin: Dysphagia starting at 10/14 0953)  AMBULATORY STATUS COMMUNICATION OF NEEDS Skin   Limited Assist Verbally Normal                       Personal Care Assistance Level of Assistance  Bathing, Feeding, Dressing Bathing Assistance: Limited assistance Feeding assistance: Limited assistance Dressing Assistance: Limited assistance     Functional Limitations Info             SPECIAL CARE FACTORS FREQUENCY  PT (By licensed PT), OT (By licensed OT)     PT Frequency: 5x OT Frequency: 5x            Contractures Contractures Info: Not present    Additional Factors Info                  Current Medications (10/28/2023):  This is the current hospital active medication list Current Facility-Administered Medications  Medication Dose Route Frequency Provider Last Rate Last Admin   acetaminophen  (TYLENOL ) tablet 650 mg  650 mg Oral Q4H PRN Duncan, Hazel V, MD   650 mg at 10/28/23 0558   amLODipine  (NORVASC ) tablet 5 mg  5 mg Oral Daily Gollan, Timothy J, MD   5 mg at 10/28/23 0940   aspirin  EC tablet 81 mg  81 mg Oral Daily Duncan, Hazel V, MD   81 mg at 10/28/23 0940   cefTRIAXone  (ROCEPHIN ) 2 g in sodium chloride  0.9 % 100 mL IVPB  2 g Intravenous Q24H Zeigler, Dustin G, RPH 200 mL/hr at 10/28/23 1318 2 g at 10/28/23 1318   diazepam  (VALIUM ) tablet 5 mg  5 mg Oral Q6H PRN Duncan, Hazel V, MD   5 mg at 10/28/23 0558   heparin injection 5,000 Units  5,000 Units Subcutaneous Q8H Lenon Marien CROME, MD   5,000 Units at 10/28/23 1318   lisinopril  (ZESTRIL ) tablet 10 mg  10 mg Oral Daily Gollan, Timothy J, MD   10 mg at 10/28/23 0940   lubiprostone  (AMITIZA ) capsule 24 mcg  24 mcg Oral BID WC Duncan, Hazel V, MD   24 mcg at 10/28/23 0948   menthol (CEPACOL) lozenge 3 mg  1 lozenge Oral PRN Kandis Devaughn Sayres, MD   3 mg at 10/26/23  0931   nicotine (NICODERM CQ - dosed in mg/24 hours) patch 21 mg  21 mg Transdermal Daily Kandis Devaughn Sayres, MD   21 mg at 10/27/23 1029   nitroGLYCERIN (NITROSTAT) SL tablet 0.4 mg  0.4 mg Sublingual Q5 Min x 3 PRN Cleatus Delayne GAILS, MD       ondansetron  (ZOFRAN ) injection 4 mg  4 mg Intravenous Q6H PRN Duncan, Hazel V, MD       pantoprazole  (PROTONIX ) EC tablet 40 mg  40 mg Oral Daily Duncan, Hazel V, MD   40 mg at 10/28/23 0940   psyllium (HYDROCIL/METAMUCIL) 1 packet  1 packet Oral Daily Dail Rankin RAMAN, RPH   1 packet at 10/27/23 1029   QUEtiapine  (SEROQUEL ) tablet 50 mg  50 mg Oral QHS Duncan, Hazel V, MD   50 mg at 10/27/23 2105   traZODone  (DESYREL ) tablet 100 mg  100 mg Oral QHS Duncan, Hazel V, MD   100 mg at 10/27/23 2105     Discharge Medications: Please see discharge summary for a list of discharge medications.  Relevant Imaging Results:  Relevant Lab Results:   Additional Information [id number]  Alfonso Rummer, LCSW

## 2023-10-28 NOTE — Progress Notes (Signed)
 PROGRESS NOTE Joan Baldwin  FMW:[id number] DOB: 06-17-1945 DOA: 10/24/2023 PCP: Joan Nancyann BRAVO, MD  Brief Narrative:  Joan Baldwin is a 78 y.o. female with medical history significant for HTN, depression and anxiety, chronic hepatitis C, chronic severe constipation. She was brought in after being found on ground from weakness but denies injuries or LOC.  ED course: Tmax 100.8, tachycardic to 108 tachypneic to 26 with normal blood pressures, O2 sat 100% on room air. Labs notable for WBC 16,000 and lactic acid 3.2 with UA strongly positive for UTI with positive nitrites and large leukocyte esterase with many bacteria. RVP negative.Troponin 658. Glucose 294, creatinine 1.15 (most recent baseline 1.09 about 3 months ago) AST 44 with otherwise normal LFTs. Hemoglobin 11.7 EKG with sinus tachycardia at 106 Chest x-ray nonacute  They were admitted for treatment of sepsis secondary to UTI. Cultures showed Ecoli in urine and blood. She has been treated with IV CTX.   Additionally- started on a heparin infusion due to elevated troponin in the 600s- Cardiology was consulted and recommended 48hr heparin gtt and no intervention needed at this time. She is chest pain free.   10/28/23- BxCx return positive for Ecoli, same as UxCx PT eval recommending SNF. Patient is medically ready for dc to SNF per PT/OT recs when available.   Assessment & Plan:   Principal Problem:   Sepsis secondary to UTI [facility]) Active Problems:   Elevated troponin   Anxiety   Essential (primary) hypertension   Chronic hepatitis C virus genotype 1a infection (HCC)   Chronic constipation   Severe sepsis (HCC)   Demand ischemia (HCC)   Generalized weakness   NSTEMI (non-ST elevated myocardial infarction) (HCC)   Urinary tract infection without hematuria  Sepsis  E. Coli UTI with pyelonephritis  E. Coli bacteremia- Cx frowing E. Coli, resistant to ampicillin and bactrim. CT abdomen shows multiple cysts in L kidney  consistent with pyelonephritis.  - continue ceftriaxone  - monitor fever curve  Elevated troponins- Likely demand. TTE g1dd, EF 60-65%. Denies chest pain - completed 48 heparin gtt per cardiology recommendations  - cardiology following, and signed off. appreciate your recs  # Rhabdomyolysis Elevated ck may be from being found down, also may be 2/2 above cardiac ischemia. No aki or elevated k. Ck down-trending - Encourage PO hydration   # Hypokalemia - replete - f/u mg and replete if low  # HCV Doesn't appear ever treated - outpt f/u  # Chronic constipation - home amitiza , psyllium  # HTN BPs soft in setting of sepsis - home meds on hold  # Dementia - home seroquel   # GAD - home valium  prn  # Debility PT recommending SNF  DVT prophylaxis: heparin IV Code Status: full Family Communication: daughter in law at bedside 10/15  Level of care: Telemetry Cardiac Status is: Inpatient Remains inpatient appropriate because: severity of illness  Consultants:  Cardiology   Procedures: None  Subjective: Pt reports feeling well. Denies chest pain. She initially declined SNF but at the insistence of her family members is now agreeable to go to SNF.   Objective: Vitals:   10/27/23 2217 10/28/23 0422 10/28/23 0545 10/28/23 0733  BP: 136/68  (!) 142/53 (!) 117/48  Pulse: 89  73 66  Resp: 20  18 18   Temp: (!) 101.8 F (38.8 C) 98.6 F (37 C) 98.4 F (36.9 C) 97.9 F (36.6 C)  TempSrc:  Oral    SpO2: 98%  99% 96%  Weight:  Height:        Intake/Output Summary (Last 24 hours) at 10/28/2023 0751 Last data filed at 10/27/2023 1608 Gross per 24 hour  Intake 2534.43 ml  Output 600 ml  Net 1934.43 ml   Filed Weights   10/24/23 2342  Weight: 69.4 kg    Examination: General exam: Appears calm and comfortable  Respiratory system: Respiratory effort normal. Cardiovascular system: well perfused Gastrointestinal system: Abdomen is nondistended, soft and  nontender. No suprapubic tenderness Central nervous system: Alert and oriented to self, moving all 4 Extremities: Symmetric 5 x 5 power. No edema Skin: No rashes, lesions or ulcers Psychiatry: calm, normal mood  Data Reviewed: I have personally reviewed following labs and imaging studies  CBC: Recent Labs  Lab 10/24/23 2355 10/25/23 0410 10/26/23 0318 10/27/23 0026 10/27/23 1045 10/28/23 0337  WBC 16.7* 16.3* 7.6 6.1 5.6 8.2  NEUTROABS 15.5*  --   --   --   --   --   HGB 11.7* 10.0* 9.9* 9.5* 9.9* 10.7*  HCT 36.4 30.6* 30.7* 30.4* 31.0* 34.0*  MCV 92.4 91.9 91.6 93.8 91.7 92.6  PLT 285 225 199 208 204 274   Basic Metabolic Panel: Recent Labs  Lab 10/24/23 2355 10/25/23 0410 10/26/23 0318 10/26/23 1246 10/27/23 0026 10/27/23 1045 10/28/23 0337  NA 140 140 143  --  142  --  142  K 3.6 3.4* 3.2*  --  3.4*  --  5.3*  CL 107 108 110  --  107  --  111  CO2 22 24 20*  --  22  --  24  GLUCOSE 294* 223* 122*  --  137*  --  105*  BUN 31* 23 11  --  9  --  11  CREATININE 1.15* 0.76 0.78  --  0.92 0.81 1.08*  CALCIUM  9.2 8.6* 7.8*  --  7.6*  --  8.1*  MG  --   --   --  1.8  --   --   --     LOS: 3 days   Joan LITTIE Piety, MD [facility]   If 7PM-7AM, please contact night-coverage [email or web address] Password [email or web address] 10/28/2023, 7:51 AM

## 2023-10-28 NOTE — Care Management Important Message (Signed)
 Important Message  Patient Details  Name: Joan Baldwin MRN: [id number] Date of Birth: [birth year]   Important Message Given:  Yes - Medicare IM     [name] 10/28/2023, 4:38 PM

## 2023-10-29 DIAGNOSIS — A419 Sepsis, unspecified organism: Secondary | ICD-10-CM | POA: Diagnosis not present

## 2023-10-29 DIAGNOSIS — N390 Urinary tract infection, site not specified: Secondary | ICD-10-CM | POA: Diagnosis not present

## 2023-10-29 LAB — BASIC METABOLIC PANEL WITH GFR
Anion gap: 11 (ref 5–15)
BUN: 16 mg/dL (ref 8–23)
CO2: 25 mmol/L (ref 22–32)
Calcium: 8.5 mg/dL — ABNORMAL LOW (ref 8.9–10.3)
Chloride: 107 mmol/L (ref 98–111)
Creatinine, Ser: 0.67 mg/dL (ref 0.44–1.00)
GFR, Estimated: >60 mL/min (ref >60)
Glucose, Bld: 107 mg/dL — ABNORMAL HIGH (ref 70–99)
Potassium: 3.9 mmol/L (ref 3.5–5.1)
Sodium: 143 mmol/L (ref 135–145)

## 2023-10-29 NOTE — Progress Notes (Signed)
 Progress Note   Patient: Joan Baldwin FMW:[id number] DOB: [birth year] DOA: 10/24/2023     4 DOS: the patient was seen and examined on 10/29/2023   Brief hospital course:  LAKETRA BOWDISH is a 78 y.o. female with medical history significant for HTN, depression and anxiety, chronic hepatitis C, chronic severe constipation. She was brought in after being found on ground from weakness but denies injuries or LOC.  ED course: Tmax 100.8, tachycardic to 108 tachypneic to 26 with normal blood pressures, O2 sat 100% on room air. Labs notable for WBC 16,000 and lactic acid 3.2 with UA strongly positive for UTI with positive nitrites and large leukocyte esterase with many bacteria. RVP negative.Troponin 658. Glucose 294, creatinine 1.15 (most recent baseline 1.09 about 3 months ago) AST 44 with otherwise normal LFTs. Hemoglobin 11.7 EKG with sinus tachycardia at 106 Chest x-ray nonacute   They were admitted for treatment of sepsis secondary to UTI. Cultures showed Ecoli in urine and blood. She has been treated with IV CTX.    Additionally- started on a heparin infusion due to elevated troponin in the 600s- Cardiology was consulted and recommended 48hr heparin gtt and no intervention needed at this time. She is chest pain free.    10/28/23- BxCx return positive for Ecoli, same as UxCx PT eval recommending SNF. Patient is medically ready for dc to SNF per PT/OT recs when available.      10/18 - pt remains stable.  SNF placement pending.    Assessment and Plan:  Sepsis  E. Coli UTI with pyelonephritis  E. Coli bacteremia- Cx frowing E. Coli, resistant to ampicillin and bactrim. CT abdomen shows multiple cysts in L kidney consistent with pyelonephritis.  - continue ceftriaxone  - monitor fever curve   Elevated troponins- Likely demand. TTE g1dd, EF 60-65%. Denies chest pain - completed 48 heparin gtt per cardiology recommendations  - cardiology following, and signed off. appreciate your  recs   Rhabdomyolysis Elevated ck may be from being found down, also may be 2/2 above cardiac ischemia. No aki or elevated k. Ck down-trending - Encourage PO hydration    Hypokalemia - resolved - Mg normal   HCV Doesn't appear ever treated - outpt f/u   Chronic constipation - home amitiza , psyllium   HTN BPs soft in setting of sepsis - home meds on hold   Dementia - home seroquel    GAD - home valium  prn   Debility PT recommending SNF      Subjective: Pt reports no complaints or issues. No chest pain. No acute events reported overnight.   Physical Exam: Vitals:   10/29/23 0411 10/29/23 0747 10/29/23 1217 10/29/23 1514  BP: (!) 137/50 (!) 130/45 (!) 117/48 (!) 103/51  Pulse: 76 70 73 72  Resp: 20 17 20 17   Temp: 99.3 F (37.4 C) 98.8 F (37.1 C) 98.6 F (37 C) 98.9 F (37.2 C)  TempSrc:      SpO2: 97% 95% 96% 99%  Weight:      Height:       General exam: awake, alert, no acute distress HEENT: moist mucus membranes, hearing grossly normal  Respiratory system: CTAB, no wheezes, rales or rhonchi, normal respiratory effort. Cardiovascular system: normal S1/S2, RRR Gastrointestinal system: soft, NT, ND Central nervous system: A&O xsefl. normal speech Extremities: moves all, no edema Skin: dry, intact, no rashes seen on visualized skin Psychiatry: normal mood, congruent affect  Data Reviewed:  Glucose 107 Ca 8.5 Otherwise normal BMP   Family Communication:  Disposition: Status is: Inpatient Remains inpatient appropriate because: pending snf placement   Planned Discharge Destination: Skilled nursing facility    Time spent: 35 minutes  Author: Burnard DELENA Cunning, DO 10/29/2023 5:59 PM  For on call review [email or web address].

## 2023-10-29 NOTE — TOC Progression Note (Signed)
 Transition of Care [facility]) - Progression Note    Patient Details  Name: Joan Baldwin MRN: [id number] Date of Birth: 07-31-1945  [facility] of [facility] CM/SW Contact  [place], [place] Phone Number: 10/29/2023, 12:37 PM  Clinical Narrative:     CSW spoke with son and daughter in law. Bed offers were reviewed. Family chose [facility]. Shara will be started. Discharge plan after rehab is for patient to return home. Family is receptive to this plan and wants patient to return home after rehab.   Auth to be initiated.                     Expected Discharge Plan and Services         Expected Discharge Date: 10/27/23                                     Social Drivers of Health (SDOH) Interventions SDOH Screenings   Food Insecurity: No Food Insecurity (06/22/2023)  Housing: Unknown (06/22/2023)  Transportation Needs: No Transportation Needs (06/22/2023)  Utilities: Not At Risk (06/22/2023)  Alcohol Screen: Low Risk  (06/22/2023)  Depression (PHQ2-9): Low Risk  (06/22/2023)  Recent Concern: Depression (PHQ2-9) - Medium Risk (06/17/2023)  Financial Resource Strain: Low Risk  (06/22/2023)  Physical Activity: Insufficiently Active (06/22/2023)  Social Connections: Moderately Isolated (06/22/2023)  Stress: No Stress Concern Present (06/22/2023)  Tobacco Use: Medium Risk (10/24/2023)  Health Literacy: Adequate Health Literacy (06/22/2023)    Readmission Risk Interventions     No data to display

## 2023-10-30 DIAGNOSIS — A419 Sepsis, unspecified organism: Secondary | ICD-10-CM | POA: Diagnosis not present

## 2023-10-30 DIAGNOSIS — N390 Urinary tract infection, site not specified: Secondary | ICD-10-CM | POA: Diagnosis not present

## 2023-10-30 LAB — CULTURE, BLOOD (ROUTINE X 2)
Culture: NO GROWTH
Special Requests: ADEQUATE

## 2023-10-30 LAB — BASIC METABOLIC PANEL WITH GFR
Anion gap: 10 (ref 5–15)
BUN: 19 mg/dL (ref 8–23)
CO2: 27 mmol/L (ref 22–32)
Calcium: 8.5 mg/dL — ABNORMAL LOW (ref 8.9–10.3)
Chloride: 105 mmol/L (ref 98–111)
Creatinine, Ser: 0.74 mg/dL (ref 0.44–1.00)
GFR, Estimated: >60 mL/min (ref >60)
Glucose, Bld: 97 mg/dL (ref 70–99)
Potassium: 4.1 mmol/L (ref 3.5–5.1)
Sodium: 142 mmol/L (ref 135–145)

## 2023-10-30 LAB — CK: Total CK: 2488 U/L — ABNORMAL HIGH (ref 38–234)

## 2023-10-30 MED ORDER — SODIUM CHLORIDE 0.9 % IV SOLN: INTRAVENOUS | Status: DC

## 2023-10-30 MED ORDER — ORAL CARE MOUTH RINSE: 15.0000 mL | OROMUCOSAL | Status: DC | PRN

## 2023-10-30 NOTE — Plan of Care (Signed)
  Problem: Education: Goal: Understanding of cardiac disease, CV risk reduction, and recovery process will improve Outcome: Progressing Goal: Individualized Educational Video(s) Outcome: Progressing   Problem: Activity: Goal: Ability to tolerate increased activity will improve Outcome: Progressing   Problem: Cardiac: Goal: Ability to achieve and maintain adequate cardiovascular perfusion will improve Outcome: Progressing   Problem: Health Behavior/Discharge Planning: Goal: Ability to safely manage health-related needs after discharge will improve Outcome: Progressing   Problem: Fluid Volume: Goal: Hemodynamic stability will improve Outcome: Progressing   Problem: Clinical Measurements: Goal: Diagnostic test results will improve Outcome: Progressing Goal: Signs and symptoms of infection will decrease Outcome: Progressing   Problem: Respiratory: Goal: Ability to maintain adequate ventilation will improve Outcome: Progressing   Problem: Education: Goal: Knowledge of General Education information will improve Description: Including pain rating scale, medication(s)/side effects and non-pharmacologic comfort measures Outcome: Progressing   Problem: Health Behavior/Discharge Planning: Goal: Ability to manage health-related needs will improve Outcome: Progressing   Problem: Clinical Measurements: Goal: Ability to maintain clinical measurements within normal limits will improve Outcome: Progressing Goal: Will remain free from infection Outcome: Progressing Goal: Diagnostic test results will improve Outcome: Progressing Goal: Respiratory complications will improve Outcome: Progressing Goal: Cardiovascular complication will be avoided Outcome: Progressing   Problem: Activity: Goal: Risk for activity intolerance will decrease Outcome: Progressing   Problem: Nutrition: Goal: Adequate nutrition will be maintained Outcome: Progressing   Problem: Coping: Goal: Level of  anxiety will decrease Outcome: Progressing   Problem: Elimination: Goal: Will not experience complications related to bowel motility Outcome: Progressing Goal: Will not experience complications related to urinary retention Outcome: Progressing   Problem: Pain Managment: Goal: General experience of comfort will improve and/or be controlled Outcome: Progressing   Problem: Safety: Goal: Ability to remain free from injury will improve Outcome: Progressing   Problem: Skin Integrity: Goal: Risk for impaired skin integrity will decrease Outcome: Progressing

## 2023-10-30 NOTE — Progress Notes (Signed)
 Progress Note   Patient: Joan Baldwin FMW:[id number] DOB: Mar 20, 1945 DOA: 10/24/2023     5 DOS: the patient was seen and examined on 10/30/2023   Brief hospital course:  Joan Baldwin is a 78 y.o. female with medical history significant for HTN, depression and anxiety, chronic hepatitis C, chronic severe constipation. She was brought in after being found on ground from weakness but denies injuries or LOC.  ED course: Tmax 100.8, tachycardic to 108 tachypneic to 26 with normal blood pressures, O2 sat 100% on room air. Labs notable for WBC 16,000 and lactic acid 3.2 with UA strongly positive for UTI with positive nitrites and large leukocyte esterase with many bacteria. RVP negative.Troponin 658. Glucose 294, creatinine 1.15 (most recent baseline 1.09 about 3 months ago) AST 44 with otherwise normal LFTs. Hemoglobin 11.7 EKG with sinus tachycardia at 106 Chest x-ray nonacute   They were admitted for treatment of sepsis secondary to UTI. Cultures showed Ecoli in urine and blood. She has been treated with IV CTX.    Additionally- started on a heparin infusion due to elevated troponin in the 600s- Cardiology was consulted and recommended 48hr heparin gtt and no intervention needed at this time. She is chest pain free.    10/28/23- BxCx return positive for Ecoli, same as UxCx PT eval recommending SNF. Patient is medically ready for dc to SNF per PT/OT recs when available.      10/18 - pt remains stable.  SNF placement pending.    Assessment and Plan:  Sepsis  E. Coli UTI with pyelonephritis  E. Coli bacteremia- Cx frowing E. Coli, resistant to ampicillin and bactrim. CT abdomen shows multiple cysts in L kidney consistent with pyelonephritis.  - continue ceftriaxone  - monitor fever curve   Elevated troponins- Likely demand. TTE g1dd, EF 60-65%. Denies chest pain - completed 48 heparin gtt per cardiology recommendations  - cardiology following, and signed off. appreciate your  recs   Rhabdomyolysis Elevated ck may be from being found down, also may be 2/2 above cardiac ischemia. No aki or elevated k. Ck down-trending -10/19 -- rechecked CK 3795 >> 2488 - will resume some IV fluids    Hypokalemia - resolved - Mg normal   HCV Doesn't appear ever treated - outpt f/u   Chronic constipation - home amitiza , psyllium   HTN BPs soft in setting of sepsis - home meds on hold   Dementia - home seroquel    GAD - home valium  prn   Debility PT recommending SNF      Subjective: Pt awake sitting up in bed this AM. She reports feeling much better.  Denies acute complaint. No acute events reported.  Physical Exam: Vitals:   10/30/23 0429 10/30/23 0907 10/30/23 1118 10/30/23 1600  BP: (!) 138/54 96/81 (!) 131/48 (!) 136/56  Pulse: 65 70 66 68  Resp: 18 17 16  (!) 24  Temp: 98.6 F (37 C) 97.7 F (36.5 C) 98.4 F (36.9 C) 98.5 F (36.9 C)  TempSrc:    Oral  SpO2: 94% 97% 96% 98%  Weight:      Height:       General exam: awake, alert, no acute distress HEENT: moist mucus membranes, hearing grossly normal  Respiratory system: CTAB, no wheezes, rales or rhonchi, normal respiratory effort. Cardiovascular system: normal S1/S2, RRR Gastrointestinal system: soft, NT, ND Central nervous system: A&O xsefl. normal speech Extremities: moves all, no edema Skin: dry, intact, no rashes seen on visualized skin Psychiatry: normal mood, congruent affect  Data Reviewed:  Notable labs CK 2488 from 3795 on last check 10/15 Ca 8.5 Otherwise normal BMP   Family Communication:   Disposition: Status is: Inpatient Remains inpatient appropriate because: pending snf placement on IV fluids for rhabdo   Planned Discharge Destination: Skilled nursing facility    Time spent: 35 minutes  Author: Burnard DELENA Cunning, DO 10/30/2023 5:49 PM  For on call review [email or web address].

## 2023-10-30 NOTE — Plan of Care (Signed)
  Problem: Education: Goal: Understanding of cardiac disease, CV risk reduction, and recovery process will improve Outcome: Progressing Goal: Individualized Educational Video(s) Outcome: Progressing   Problem: Activity: Goal: Ability to tolerate increased activity will improve Outcome: Progressing   Problem: Cardiac: Goal: Ability to achieve and maintain adequate cardiovascular perfusion will improve Outcome: Progressing   

## 2023-10-31 ENCOUNTER — Telehealth: Payer: Self-pay

## 2023-10-31 DIAGNOSIS — N390 Urinary tract infection, site not specified: Secondary | ICD-10-CM | POA: Diagnosis not present

## 2023-10-31 DIAGNOSIS — A419 Sepsis, unspecified organism: Secondary | ICD-10-CM | POA: Diagnosis not present

## 2023-10-31 LAB — CK: Total CK: 878 U/L — ABNORMAL HIGH (ref 38–234)

## 2023-10-31 NOTE — Telephone Encounter (Signed)
 I haven't seen an order for them.

## 2023-10-31 NOTE — Plan of Care (Signed)
 Since shift change report @ 0715 - It was informed that patient insists on purwick since she's incontinent and used to wearing depends at home.  When asked, patient confirmed this to be true and stated, Im too weak  to stand and dont want to wet the bed.    Purwick was placed but informed that it doesn't work all the time and would be best to pivot to the [facility].  Patient stated, I can't do it.  Later in the shift, when needing the bed changed again, patient quickly stood up and sat on the [facility] (for the bed to be cleaned.)  Also, walked with mobility 4x around the nurses station with no difficulty.  7x TTL bed changes had to be completed and each time - patient was asked to please just let us  know and we'll help her to the [facility] - Yet, patient continues to refuse.

## 2023-10-31 NOTE — TOC CM/SW Note (Signed)
 Transition of Care (TOC) CM/SW Note    RE: Joan Baldwin  Date of Birth: 1945/06/17 Date: 10/31/2023     To Whom It May Concern:   Please be advised that the above-named patient will require a short-term nursing home stay - anticipated 30 days or less for rehabilitation and strengthening.  The plan is for return home.  Alfonso Rummer MSW, LCSW Licensed Clinical Social Worker  Inpatient Care Management [phone]

## 2023-10-31 NOTE — Progress Notes (Signed)
 Physical Therapy Treatment Patient Details Name: AYLANIE CUBILLOS MRN: [id number] DOB: 02-Apr-1945 Today's Date: 10/31/2023   History of Present Illness Chasady S Kleckley is a 78 y.o. female with medical history significant for HTN, depression and anxiety, chronic hepatitis C, chronic severe constipation being admitted with sepsis due to possible UTI after presenting with a 1 week history of weakness.  Per EMS family found patient on the floor of the living room.  Patient denies falling but stated she was so weak she had to sit down on the floor.  She denied pain.  Denied nausea, vomiting or abdominal pain, cough, chest pain, fever or chills, change in bowel habits from baseline  In the ED Tmax 100.8, tachycardic to 108 tachypneic to 26 with normal blood pressures, O2 sat 100% on room air.Labs notable for WBC 16,000 and lactic acid 3.2 with UA strongly positive for UTI with positive nitrites and large leukocyte esterase with many bacteria.  Respiratory viral panel was negative    PT Comments  Patient seen for PT session focused on OOB mobility. Patient required modA for transfers and mobility ans used a RW. Pt able to walk 15 feet with RW modA. Tolerated session well with no signs of exertion or distress. Vitals remained stable during activity. Main limiting factors today were fatigue . Interventions aimed at improving OOB mobility. Continued skilled PT recommended to progress toward functional goals and support discharge readiness.    If plan is discharge home, recommend the following: Two people to help with walking and/or transfers;Two people to help with bathing/dressing/bathroom   Can travel by private vehicle     No  Equipment Recommendations       Recommendations for Other Services       Precautions / Restrictions Precautions Precautions: Fall Restrictions Weight Bearing Restrictions Per Provider Order: No     Mobility  Bed Mobility Overal bed mobility: Needs Assistance Bed  Mobility: Rolling, Sidelying to Sit Rolling: Min assist Sidelying to sit: Min assist       General bed mobility comments: vc for hand placement and movement sequence    Transfers Overall transfer level: Needs assistance Equipment used: Rolling walker (2 wheels) Transfers: Sit to/from Stand Sit to Stand: Min assist                Ambulation/Gait Ambulation/Gait assistance: Mod assist, Min assist Gait Distance (Feet): 15 Feet Assistive device: Rolling walker (2 wheels) Gait Pattern/deviations: Step-to pattern, Drifts right/left, Trunk flexed, Narrow base of support, Shuffle, Knees buckling, Decreased stride length           Stairs             Wheelchair Mobility     Tilt Bed    Modified Rankin (Stroke Patients Only)       Balance Overall balance assessment: Needs assistance Sitting-balance support: Feet supported Sitting balance-Leahy Scale: Fair       Standing balance-Leahy Scale: Fair Standing balance comment: bilateral lower extremity trembling during static standing                            Communication Communication Communication: No apparent difficulties  Cognition Arousal: Alert Behavior During Therapy: WFL for tasks assessed/performed   PT - Cognitive impairments: No apparent impairments                         Following commands: Intact      Cueing Cueing Techniques:  Verbal cues  Exercises Other Exercises Other Exercises: seated marches at bedside 2x 10; LAQ 2 x 10    General Comments        Pertinent Vitals/Pain Pain Assessment Pain Assessment: No/denies pain    Home Living                          Prior Function            PT Goals (current goals can now be found in the care plan section) Acute Rehab PT Goals Patient Stated Goal: pt. wants to get stronger PT Goal Formulation: With patient Time For Goal Achievement: 11/10/23 Potential to Achieve Goals: Fair Progress towards PT  goals: Progressing toward goals    Frequency    Min 3X/week      PT Plan      Co-evaluation              AM-PAC PT 6 Clicks Mobility   Outcome Measure  Help needed turning from your back to your side while in a flat bed without using bedrails?: A Little Help needed moving from lying on your back to sitting on the side of a flat bed without using bedrails?: A Little Help needed moving to and from a bed to a chair (including a wheelchair)?: A Little Help needed standing up from a chair using your arms (e.g., wheelchair or bedside chair)?: A Little Help needed to walk in hospital room?: A Lot Help needed climbing 3-5 steps with a railing? : Total 6 Click Score: 15    End of Session Equipment Utilized During Treatment: Gait belt Activity Tolerance: Patient tolerated treatment well Patient left: in bed;with bed alarm set Nurse Communication: Mobility status PT Visit Diagnosis: Unsteadiness on feet (R26.81);Muscle weakness (generalized) (M62.81)     Time: 8948-8885 PT Time Calculation (min) (ACUTE ONLY): 23 min  Charges:    $Therapeutic Activity: 8-22 mins PT General Charges $$ ACUTE PT VISIT: 1 Visit                     Sherlean Lesches DPT, PT     Sherlean A Ahijah Devery 10/31/2023, 11:19 AM

## 2023-10-31 NOTE — Progress Notes (Signed)
 Mobility Specialist - Progress Note   10/31/23 1600  Mobility  Activity Ambulated with assistance;Stood at bedside;Respositioned in chair  Level of Assistance Contact guard assist, steadying assist  Assistive Device None  Distance Ambulated (ft) 360 ft  Range of Motion/Exercises All extremities  Activity Response Tolerated well  Mobility visit 1 Mobility  Mobility Specialist Start Time (ACUTE ONLY) 1550  Mobility Specialist Stop Time (ACUTE ONLY) 1640  Mobility Specialist Time Calculation (min) (ACUTE ONLY) 50 min   Pt was supine in bed upon entry. Pt agreed to mobility. Pt before activity had a BM. Pt was able to get to EOB independently. Pt was able to STS independently.  Pt was able to reposition in recliner. Pt ambulated well. Pt did not complain of fatigue or soreness. Pt O2 vitals remained above 94% throughout activity. After activity pt returned to the room. Pt is in bed with needs and bed alarm on.  Clem Rodes Mobility Specialist 10/31/23, 4:24 PM

## 2023-10-31 NOTE — Plan of Care (Signed)
  Problem: Education: Goal: Understanding of cardiac disease, CV risk reduction, and recovery process will improve Outcome: Progressing Goal: Individualized Educational Video(s) Outcome: Progressing   Problem: Activity: Goal: Ability to tolerate increased activity will improve Outcome: Progressing   Problem: Cardiac: Goal: Ability to achieve and maintain adequate cardiovascular perfusion will improve Outcome: Progressing   Problem: Health Behavior/Discharge Planning: Goal: Ability to safely manage health-related needs after discharge will improve Outcome: Progressing   Problem: Fluid Volume: Goal: Hemodynamic stability will improve Outcome: Progressing   Problem: Clinical Measurements: Goal: Diagnostic test results will improve Outcome: Progressing Goal: Signs and symptoms of infection will decrease Outcome: Progressing   Problem: Respiratory: Goal: Ability to maintain adequate ventilation will improve Outcome: Progressing   Problem: Education: Goal: Knowledge of General Education information will improve Description: Including pain rating scale, medication(s)/side effects and non-pharmacologic comfort measures Outcome: Progressing   Problem: Health Behavior/Discharge Planning: Goal: Ability to manage health-related needs will improve Outcome: Progressing   Problem: Clinical Measurements: Goal: Ability to maintain clinical measurements within normal limits will improve Outcome: Progressing Goal: Will remain free from infection Outcome: Progressing Goal: Diagnostic test results will improve Outcome: Progressing Goal: Respiratory complications will improve Outcome: Progressing Goal: Cardiovascular complication will be avoided Outcome: Progressing   Problem: Activity: Goal: Risk for activity intolerance will decrease Outcome: Progressing   Problem: Nutrition: Goal: Adequate nutrition will be maintained Outcome: Progressing   Problem: Coping: Goal: Level of  anxiety will decrease Outcome: Progressing   Problem: Elimination: Goal: Will not experience complications related to bowel motility Outcome: Progressing Goal: Will not experience complications related to urinary retention Outcome: Progressing   Problem: Pain Managment: Goal: General experience of comfort will improve and/or be controlled Outcome: Progressing   Problem: Safety: Goal: Ability to remain free from injury will improve Outcome: Progressing   Problem: Skin Integrity: Goal: Risk for impaired skin integrity will decrease Outcome: Progressing

## 2023-10-31 NOTE — Progress Notes (Signed)
 Progress Note   Patient: Joan Baldwin DOB: 06-12-1945 DOA: 10/24/2023     6 DOS: the patient was seen and examined on 10/31/2023   Brief hospital course:  Joan Baldwin is a 78 y.o. female with medical history significant for HTN, depression and anxiety, chronic hepatitis C, chronic severe constipation. She was brought in after being found on ground from weakness but denies injuries or LOC.  ED course: Tmax 100.8, tachycardic to 108 tachypneic to 26 with normal blood pressures, O2 sat 100% on room air. Labs notable for WBC 16,000 and lactic acid 3.2 with UA strongly positive for UTI with positive nitrites and large leukocyte esterase with many bacteria. RVP negative.Troponin 658. Glucose 294, creatinine 1.15 (most recent baseline 1.09 about 3 months ago) AST 44 with otherwise normal LFTs. Hemoglobin 11.7 EKG with sinus tachycardia at 106 Chest x-ray nonacute   They were admitted for treatment of sepsis secondary to UTI. Cultures showed Ecoli in urine and blood. She has been treated with IV CTX.    Additionally- started on a heparin infusion due to elevated troponin in the 600s- Cardiology was consulted and recommended 48hr heparin gtt and no intervention needed at this time. She is chest pain free.    10/28/23- BxCx return positive for Ecoli, same as UxCx PT eval recommending SNF. Patient is medically ready for dc to SNF per PT/OT recs when available.      10/18 - pt remains stable.  SNF placement pending.    Assessment and Plan:  Sepsis  E. Coli UTI with pyelonephritis  E. Coli bacteremia- Cx frowing E. Coli, resistant to ampicillin and bactrim. CT abdomen shows multiple cysts in L kidney consistent with pyelonephritis.  - Completed course of ceftriaxone  - monitor fever curve   Elevated troponins- Likely demand. TTE g1dd, EF 60-65%. Denies chest pain - completed 48 heparin gtt per cardiology recommendations  - cardiology following, and signed off.  appreciate your recs   Rhabdomyolysis Elevated ck may be from being found down, also may be 2/2 above cardiac ischemia. No aki or elevated k. Ck down-trending -10/19 -- rechecked CK 3795 >> 2488 -10/20 --CK improved 878  --Discontinue IV fluids --Encourage oral hydration   Hypokalemia - resolved - Mg normal   HCV Doesn't appear ever treated - outpt f/u   Chronic constipation - home amitiza , psyllium   HTN BPs soft in setting of sepsis - home meds on hold   Dementia - home seroquel    GAD - home valium  prn   Debility PT recommending SNF      Subjective: Pt sleeping comfortably this morning seen on rounds, wakes to voice briefly.  She denies acute complaints, and no acute events reported.  Physical Exam: Vitals:   10/30/23 1600 10/30/23 2000 10/31/23 0400 10/31/23 0935  BP: (!) 136/56 (!) 138/41 (!) 140/44 (!) 149/50  Pulse: 68 71 74 74  Resp: (!) 24  20 16   Temp: 98.5 F (36.9 C) 98.7 F (37.1 C) 98.4 F (36.9 C) (!) 96.7 F (35.9 C)  TempSrc: Oral Oral Oral   SpO2: 98% 97% 97% 98%  Weight:      Height:       General exam: Sleeping comfortably, wakes to voice, no acute distress HEENT: moist mucus membranes, hearing grossly normal  Respiratory system: Lungs clear bilaterally, no wheezes or rhonchi, on room air with normal respiratory effort Cardiovascular system: normal S1/S2, RRR Gastrointestinal system: soft, NT, ND Central nervous system: A&O to self. normal speech Extremities:  moves all, no edema Skin: dry, intact, no rashes seen on visualized skin Psychiatry: normal mood, congruent affect  Data Reviewed:  Notable labs CK 2488 >> 878 No other new labs today   Family Communication: none present, will attempt to call as time allows.   Disposition: Status is: Inpatient Remains inpatient appropriate because: pending snf placement.  Per TOC, PASSR pending.    Planned Discharge Destination: Skilled nursing facility    Time spent: 35  minutes  Author: Burnard DELENA Cunning, DO 10/31/2023 1:59 PM  For on call review [email or web address].

## 2023-10-31 NOTE — Telephone Encounter (Signed)
 Copied from CRM #[id number]. Topic: General - Other >> Oct 31, 2023 12:16 PM Myrick T wrote: Reason for CRM: patients daughter in law [place] request to have patients diapers ordered through [employer]. Please f/u with Rosaline [phone].

## 2023-10-31 NOTE — Progress Notes (Addendum)
 At 1935 the patient has had a full bed change done by the night shift CNA because she requested it. She was not wet. The linen was not wet. 10 minutes after this the patient calls to say that she is wet.  Patient refuses to go onto the [facility]. She is physically able to stand and pivot. She has walked the entire unit.  The purwick is still in place. It is working as her receptacle is half full.  The patient has used the call bell 6 times since the beginning of the shift.  She is visually agitated, short tempered, raising her voice while talking to myself and the CNA. Every need as well as request has been met.

## 2023-11-01 ENCOUNTER — Other Ambulatory Visit: Payer: Self-pay

## 2023-11-01 DIAGNOSIS — A419 Sepsis, unspecified organism: Secondary | ICD-10-CM | POA: Diagnosis not present

## 2023-11-01 DIAGNOSIS — N390 Urinary tract infection, site not specified: Secondary | ICD-10-CM | POA: Diagnosis not present

## 2023-11-01 NOTE — TOC Progression Note (Signed)
 Transition of Care [facility]) - Progression Note    Patient Details  Name: Joan Baldwin MRN: [id number] Date of Birth: [birth year]  [facility]) CM/SW Contact  Alfonso Rummer, LCSW Phone Number: 11/01/2023, 4:09 PM  Clinical Narrative:     Joan Baldwin Rummer submitted pasrr review documents on 10/28/2023. LCSW A. Rummer called pasrr [place] [facility] customer service 11/01/23 spk with Elspeth at 9:14am and 3:56pm. LCSW A. Rummer was informed both times pasrr is still under review. Garnette with pasrr acknowledge documents were received 10/17 and pt was assigned a pasrr review on 10/19. Garnette with pasrr report there is no escalation process. Pt insurance auth expires 10/21 [place] [employer] is aware of expiration. LCSW A. Nikiah Goin stressed the importance of receiving a pasrr number. [facility] is aware of pasrr delay.                     Expected Discharge Plan and Services         Expected Discharge Date: 10/27/23                                     Social Drivers of Health (SDOH) Interventions SDOH Screenings   Food Insecurity: No Food Insecurity (10/29/2023)  Housing: Low Risk  (10/29/2023)  Transportation Needs: No Transportation Needs (10/29/2023)  Utilities: Not At Risk (10/29/2023)  Alcohol Screen: Low Risk  (06/22/2023)  Depression (PHQ2-9): Low Risk  (06/22/2023)  Recent Concern: Depression (PHQ2-9) - Medium Risk (06/17/2023)  Financial Resource Strain: Low Risk  (06/22/2023)  Physical Activity: Insufficiently Active (06/22/2023)  Social Connections: Moderately Isolated (10/29/2023)  Stress: No Stress Concern Present (06/22/2023)  Tobacco Use: Medium Risk (10/24/2023)  Health Literacy: Adequate Health Literacy (06/22/2023)    Readmission Risk Interventions     No data to display

## 2023-11-01 NOTE — Progress Notes (Signed)
 Progress Note   Patient: Joan Baldwin FMW:[id number] DOB: May 29, 1945 DOA: 10/24/2023     7 DOS: the patient was seen and examined on 11/01/2023   Brief hospital course:  Joan Baldwin is a 78 y.o. female with medical history significant for HTN, depression and anxiety, chronic hepatitis C, chronic severe constipation. She was brought in after being found on ground from weakness but denies injuries or LOC.  ED course: Tmax 100.8, tachycardic to 108 tachypneic to 26 with normal blood pressures, O2 sat 100% on room air. Labs notable for WBC 16,000 and lactic acid 3.2 with UA strongly positive for UTI with positive nitrites and large leukocyte esterase with many bacteria. RVP negative.Troponin 658. Glucose 294, creatinine 1.15 (most recent baseline 1.09 about 3 months ago) AST 44 with otherwise normal LFTs. Hemoglobin 11.7 EKG with sinus tachycardia at 106 Chest x-ray nonacute   They were admitted for treatment of sepsis secondary to UTI. Cultures showed Ecoli in urine and blood. She has been treated with IV CTX.    Additionally- started on a heparin infusion due to elevated troponin in the 600s- Cardiology was consulted and recommended 48hr heparin gtt and no intervention needed at this time. She is chest pain free.    10/28/23- BxCx return positive for Ecoli, same as UxCx PT eval recommending SNF. Patient is medically ready for dc to SNF per PT/OT recs when available.  10/18 - pt remains stable.  SNF placement pending.   Assessment and Plan:  Sepsis  E. Coli UTI with pyelonephritis  E. Coli bacteremia- Cx frowing E. Coli, resistant to ampicillin and bactrim. CT abdomen shows multiple cysts in L kidney consistent with pyelonephritis.  - Completed course of ceftriaxone  - monitor     Elevated troponins- Likely demand. TTE g1dd, EF 60-65%. Denies chest pain - completed 48 heparin gtt per cardiology recommendations, they have deferred additional w/u at this time, can f/u as  outpt   Rhabdomyolysis Elevated ck may be from being found down, also may be 2/2 above cardiac ischemia. No aki or elevated k. Ck down-trending -10/19 -- rechecked CK 3795 >> 2488 -10/20 --CK improved 878  --Discontinued IV fluids --Encourage oral hydration   Hypokalemia - resolved - Mg normal   HCV Doesn't appear ever treated - outpt f/u   Chronic constipation - home amitiza , psyllium   HTN BPs soft in setting of sepsis - home meds on hold   Dementia - home seroquel    GAD - home valium  prn   Debility PT recommending SNF      Subjective: resting in bed, no complaints  Physical Exam: Vitals:   11/01/23 0547 11/01/23 0742 11/01/23 1228 11/01/23 1233  BP: (!) 109/56 (!) 130/47 (!) 144/104 (!) 134/56  Pulse: 65 71 78   Resp: 18     Temp: 98 F (36.7 C) 98.3 F (36.8 C) 97.8 F (36.6 C)   TempSrc:      SpO2: 90% 95% 96%   Weight:      Height:       General exam: Sleeping comfortably, wakes to voice, no acute distress HEENT: moist mucus membranes, hearing grossly normal  Respiratory system: Lungs clear bilaterally, no wheezes or rhonchi, on room air with normal respiratory effort Cardiovascular system: normal S1/S2, RRR Gastrointestinal system: soft, NT, ND Central nervous system: A&O to self. normal speech Extremities: moves all, no edema Skin: dry, intact, no rashes seen on visualized skin Psychiatry: normal mood, congruent affect  Data Reviewed: All priors  Family Communication: daughter in law michelle telephonically 10/21   Disposition: Status is: Inpatient Remains inpatient appropriate because: pending snf placement.  Per TOC, PASSR pending.    Planned Discharge Destination: Skilled nursing facility     Author: Devaughn KATHEE Ban, MD 11/01/2023 4:57 PM  For on call review [email or web address].

## 2023-11-01 NOTE — Progress Notes (Signed)
 Mobility Specialist - Progress Note  Pre-mobility:, SpO2- 98%  During mobility:  SpO2-96%  Post-mobility: , SPO2-98%   11/01/23 1000  Mobility  Activity Ambulated with assistance;Stood at bedside  Level of Assistance Contact guard assist, steadying assist  Assistive Device None  Distance Ambulated (ft) 225 ft  Range of Motion/Exercises All extremities  Activity Response Tolerated well  Mobility visit 1 Mobility  Mobility Specialist Start Time (ACUTE ONLY) [id number]  Mobility Specialist Stop Time (ACUTE ONLY) 1003  Mobility Specialist Time Calculation (min) (ACUTE ONLY) 20 min   Pt was supine in bed on RA upon entry. O2 vitals were taken as a precaution. Pt agreed to mobility. Pt is able to get to EOB independently with bed features and no AD. Pt is able to STS independently with no AD. Pt ambulated well. After activity pt returned to the room in her bed with needs in reach and bed alarm on.  Clem Rodes Mobility Specialist 11/01/23, 10:46 AM

## 2023-11-01 NOTE — Plan of Care (Signed)
  Problem: Education: Goal: Understanding of cardiac disease, CV risk reduction, and recovery process will improve Outcome: Progressing Goal: Individualized Educational Video(s) Outcome: Progressing   Problem: Activity: Goal: Ability to tolerate increased activity will improve Outcome: Progressing   Problem: Cardiac: Goal: Ability to achieve and maintain adequate cardiovascular perfusion will improve Outcome: Progressing   Problem: Health Behavior/Discharge Planning: Goal: Ability to safely manage health-related needs after discharge will improve Outcome: Progressing   Problem: Fluid Volume: Goal: Hemodynamic stability will improve Outcome: Progressing   Problem: Clinical Measurements: Goal: Diagnostic test results will improve Outcome: Progressing Goal: Signs and symptoms of infection will decrease Outcome: Progressing   Problem: Respiratory: Goal: Ability to maintain adequate ventilation will improve Outcome: Progressing   Problem: Education: Goal: Knowledge of General Education information will improve Description: Including pain rating scale, medication(s)/side effects and non-pharmacologic comfort measures Outcome: Progressing   Problem: Health Behavior/Discharge Planning: Goal: Ability to manage health-related needs will improve Outcome: Progressing   Problem: Clinical Measurements: Goal: Ability to maintain clinical measurements within normal limits will improve Outcome: Progressing Goal: Will remain free from infection Outcome: Progressing Goal: Diagnostic test results will improve Outcome: Progressing Goal: Respiratory complications will improve Outcome: Progressing Goal: Cardiovascular complication will be avoided Outcome: Progressing   Problem: Activity: Goal: Risk for activity intolerance will decrease Outcome: Progressing   Problem: Nutrition: Goal: Adequate nutrition will be maintained Outcome: Progressing   Problem: Coping: Goal: Level of  anxiety will decrease Outcome: Progressing   Problem: Elimination: Goal: Will not experience complications related to bowel motility Outcome: Progressing Goal: Will not experience complications related to urinary retention Outcome: Progressing   Problem: Pain Managment: Goal: General experience of comfort will improve and/or be controlled Outcome: Progressing   Problem: Safety: Goal: Ability to remain free from injury will improve Outcome: Progressing   Problem: Skin Integrity: Goal: Risk for impaired skin integrity will decrease Outcome: Progressing

## 2023-11-01 NOTE — Progress Notes (Signed)
 Mobility Specialist - Progress Note    11/01/23 1551  Mobility  Activity Ambulated with assistance;Stood at bedside  Level of Assistance Contact guard assist, steadying assist  Assistive Device None  Distance Ambulated (ft) 300 ft  Range of Motion/Exercises All extremities  Activity Response Tolerated well  Mobility visit 1 Mobility  Mobility Specialist Start Time (ACUTE ONLY) 1455  Mobility Specialist Stop Time (ACUTE ONLY) 1515  Mobility Specialist Time Calculation (min) (ACUTE ONLY) 20 min   Pt was supine in bed upon entry. Pt agreed to mobility. Pt is able to get to the EOB independently with no AD. Pt is able to STS independently. Pt ambulated well throughout activity. After activity pt returned to the room in bed with needs in reach and bed alarm on.  Clem Rodes Mobility Specialist 11/01/23, 3:54 PM

## 2023-11-02 DIAGNOSIS — N390 Urinary tract infection, site not specified: Secondary | ICD-10-CM | POA: Diagnosis not present

## 2023-11-02 DIAGNOSIS — A419 Sepsis, unspecified organism: Secondary | ICD-10-CM | POA: Diagnosis not present

## 2023-11-02 MED ORDER — DIAZEPAM 10 MG PO TABS: ORAL_TABLET | ORAL | 1 refills | Status: DC

## 2023-11-02 MED ORDER — ASPIRIN 81 MG PO TBEC: 81.0000 mg | DELAYED_RELEASE_TABLET | Freq: Every day | ORAL | Status: AC

## 2023-11-02 MED ORDER — NICOTINE 21 MG/24HR TD PT24: 21.0000 mg | MEDICATED_PATCH | Freq: Every day | TRANSDERMAL | Status: DC

## 2023-11-02 MED ORDER — LISINOPRIL 10 MG PO TABS: 10.0000 mg | ORAL_TABLET | Freq: Every day | ORAL | Status: DC

## 2023-11-02 NOTE — Discharge Summary (Signed)
 Joan Baldwin FMW:[id number] DOB: 15-Aug-1945 DOA: 10/24/2023  PCP: Gasper Nancyann BRAVO, MD  Admit date: 10/24/2023 Discharge date: 11/02/2023  Time spent: 35 minutes  Recommendations for Outpatient Follow-up:  Pcp f/u Cardiology f/u (need to call and schedule)     Discharge Diagnoses:  Principal Problem:   Sepsis secondary to UTI [facility]) Active Problems:   Elevated troponin   Anxiety   Essential (primary) hypertension   Chronic hepatitis C virus genotype 1a infection (HCC)   Chronic constipation   Severe sepsis (HCC)   Demand ischemia (HCC)   Generalized weakness   NSTEMI (non-ST elevated myocardial infarction) (HCC)   Urinary tract infection without hematuria   Discharge Condition: improved  Diet recommendation: heart healthy  Filed Weights   10/24/23 2342  Weight: 69.4 kg    History of present illness:  From admission h and p Joan Baldwin is a 78 y.o. female with medical history significant for HTN, depression and anxiety, chronic hepatitis C, chronic severe constipation being admitted with sepsis due to possible UTI after presenting with a 1 week history of weakness.  Per EMS family found patient on the floor of the living room.  Patient denies falling but stated she was so weak she had to sit down on the floor.  She denied pain.  Denied nausea, vomiting or abdominal pain, cough, chest pain, fever or chills, change in bowel habits from baseline In the ED Tmax 100.8, tachycardic to 108 tachypneic to 26 with normal blood pressures, O2 sat 100% on room air.Labs notable for WBC 16,000 and lactic acid 3.2 with UA strongly positive for UTI with positive nitrites and large leukocyte esterase with many bacteria. Respiratory viral panel was negative Troponin 658 Glucose 294, creatinine 1.15 (most recent baseline 1.09 about 3 months ago) AST 44 with otherwise normal LFTs Hemoglobin 11.7 down from baseline of 13.2 about 3 months prior, With normal platelets EKG with sinus  tachycardia at 106 Chest x-ray nonacute Patient treated with sepsis fluids started on Rocephin  Also started on a heparin infusion due to elevated troponin in the 600s  Hospital Course:   Sepsis  E. Coli UTI with pyelonephritis  E. Coli bacteremia- Cx frowing E. Coli, resistant to ampicillin and bactrim. CT abdomen shows multiple cysts in L kidney consistent with pyelonephritis.  - Completed course of ceftriaxone     Elevated troponins- Likely demand. TTE g1dd, EF 60-65%. Denies chest pain - completed 48 heparin gtt per cardiology recommendations, they have deferred additional w/u at this time, can f/u as outpt w/ dr. end   Rhabdomyolysis Elevated ck may be from being found down, also may be 2/2 above cardiac ischemia. No aki or elevated k. Ck down-trending   Hypokalemia - resolved   HCV Doesn't appear ever treated - outpt f/u   Chronic constipation - home amitiza , psyllium   HTN Bp wnl - home meds   Dementia No behavioral disturbance - home seroquel    GAD - home valium  prn   Debility PT recommending SNF    Procedures: none   Consultations: cardiology  Discharge Exam: Vitals:   11/02/23 0739 11/02/23 0909  BP: (!) 118/57 (!) 118/57  Pulse: 63   Resp: 17   Temp: 97.8 F (36.6 C)   SpO2: 96%     General exam: Sleeping comfortably, wakes to voice, no acute distress HEENT: moist mucus membranes, hearing grossly normal  Respiratory system: Lungs clear bilaterally, no wheezes or rhonchi, on room air with normal respiratory effort Cardiovascular system: normal S1/S2, RRR  Gastrointestinal system: soft, NT, ND Central nervous system: A&O to self. normal speech Extremities: moves all, no edema Skin: dry, intact, no rashes seen on visualized skin Psychiatry: normal mood, congruent affect  Discharge Instructions   Discharge Instructions     Diet - low sodium heart healthy   Complete by: As directed    Increase activity slowly   Complete by: As directed        Allergies as of 11/02/2023       Reactions   Gabapentin Itching, Rash   blister blisters   Oxcarbazepine Other (See Comments)   Double vision   Fluoxetine     Makes her feel weird and have tremors   Tramadol Hcl Other (See Comments)   Insomnia   Ultram  [tramadol Hcl]    Insomnia   Codeine Itching, Rash        Medication List     STOP taking these medications    FLUoxetine  20 MG capsule Commonly known as: PROZAC    lactulose  10 GM/15ML solution Commonly known as: CHRONULAC    lidocaine  2 % solution Commonly known as: XYLOCAINE    terbinafine  250 MG tablet Commonly known as: LAMISIL        TAKE these medications    alendronate  70 MG tablet Commonly known as: FOSAMAX  TAKE 1 TABLET BY MOUTH EVERY 7 DAYS. TAKE WITH A FULL GLASS OF WATER ON EMPTY STOMACH   amLODipine  5 MG tablet Commonly known as: NORVASC  Take 1 tablet by mouth once daily   aspirin  EC 81 MG tablet Take 1 tablet (81 mg total) by mouth daily. Swallow whole. Start taking on: November 03, 2023   Calcium  Carb-Cholecalciferol 600-12.5 MG-MCG Caps Take 1 tablet by mouth daily.   Centrum Silver tablet   diazepam  10 MG tablet Commonly known as: VALIUM  TAKE 1/2 TO 1 (ONE-HALF TO ONE) TABLET BY MOUTH 4 TIMES DAILY AS NEEDED FOR ANXIETY What changed:  how much to take how to take this when to take this   Fiber Powd Take 2 scoop by mouth daily.   lisinopril  10 MG tablet Commonly known as: ZESTRIL  Take 1 tablet (10 mg total) by mouth daily.   lubiprostone  24 MCG capsule Commonly known as: AMITIZA  Take 1 capsule (24 mcg total) by mouth 2 (two) times daily with a meal.   nicotine 21 mg/24hr patch Commonly known as: NICODERM CQ - dosed in mg/24 hours Place 1 patch (21 mg total) onto the skin daily. Start taking on: November 03, 2023   omeprazole  20 MG capsule Commonly known as: PRILOSEC Take 1 capsule by mouth once daily   QUEtiapine  50 MG tablet Commonly known as: SEROQUEL  TAKE 1  TABLET BY MOUTH AT BEDTIME   traZODone  100 MG tablet Commonly known as: DESYREL  TAKE 1 TABLET BY MOUTH AT BEDTIME       Allergies  Allergen Reactions   Gabapentin Itching and Rash    blister  blisters   Oxcarbazepine Other (See Comments)    Double vision   Fluoxetine      Makes her feel weird and have tremors   Tramadol Hcl Other (See Comments)    Insomnia   Ultram  [Tramadol Hcl]     Insomnia   Codeine Itching and Rash    Contact information for follow-up providers     Gasper Nancyann BRAVO, MD Follow up.   Specialty: Family Medicine Contact information: [address] [place] [address] [phone]         Mady Bruckner, MD Follow up.  Specialty: Cardiology Why: in 2-4 weeks Contact information: [address] [place] [address] [phone]              Contact information for after-discharge care     Destination     [facility] .   Service: Skilled Nursing Contact information: [address] [place]  [address] [phone]                      The results of significant diagnostics from this hospitalization (including imaging, microbiology, ancillary and laboratory) are listed below for reference.    Significant Diagnostic Studies: ECHOCARDIOGRAM COMPLETE Result Date: 10/26/2023    ECHOCARDIOGRAM REPORT   Patient Name:   JUSTIN BUECHNER Welsch Date of Exam: 10/26/2023 Medical Rec #:  [id number]         Height:       65.0 in Accession #:    [id number]        Weight:       153.0 lb Date of Birth:  04/29/1945         BSA:          1.765 m Patient Age:    78 years          BP:           122/50 mmHg Patient Gender: F                 HR:           82 bpm. Exam Location:  [facility] Procedure: 2D Echo, Cardiac Doppler and Color Doppler (Both Spectral and Color            Flow Doppler were utilized during procedure). Indications:     Elevated troponin  History:          Patient has no prior history of Echocardiogram examinations.                  Risk Factors:Hypertension. Anxiety.  Sonographer:     Christopher Furnace Referring Phys:  [id number] DELAYNE LULLA SOLIAN Diagnosing Phys: Evalene Lunger MD IMPRESSIONS  1. Left ventricular ejection fraction, by estimation, is 60 to 65%. The left ventricle has normal function. The left ventricle has no regional wall motion abnormalities. There is mild left ventricular hypertrophy. Left ventricular diastolic parameters are consistent with Grade I diastolic dysfunction (impaired relaxation).  2. Right ventricular systolic function is normal. The right ventricular size is normal. There is normal pulmonary artery systolic pressure. The estimated right ventricular systolic pressure is 18.4 mmHg.  3. The mitral valve is normal in structure. No evidence of mitral valve regurgitation. No evidence of mitral stenosis.  4. The aortic valve is normal in structure. Aortic valve regurgitation is not visualized. Aortic valve sclerosis is present, with no evidence of aortic valve stenosis.  5. The inferior vena cava is normal in size with greater than 50% respiratory variability, suggesting right atrial pressure of 3 mmHg. FINDINGS  Left Ventricle: Left ventricular ejection fraction, by estimation, is 60 to 65%. The left ventricle has normal function. The left ventricle has no regional wall motion abnormalities. Strain was performed and the global longitudinal strain is indeterminate. The left ventricular internal cavity size was normal in size. There is mild left ventricular hypertrophy. Left ventricular diastolic parameters are consistent with Grade I diastolic dysfunction (impaired relaxation). Right Ventricle: The right ventricular size is normal. No increase in right ventricular wall thickness. Right ventricular systolic function is  normal. There is normal pulmonary artery systolic pressure. The tricuspid regurgitant velocity is 1.83 m/s, and  with an assumed right  atrial pressure of 5 mmHg, the estimated right ventricular systolic pressure is 18.4 mmHg. Left Atrium: Left atrial size was normal in size. Right Atrium: Right atrial size was normal in size. Pericardium: There is no evidence of pericardial effusion. Mitral Valve: The mitral valve is normal in structure. No evidence of mitral valve regurgitation. No evidence of mitral valve stenosis. MV peak gradient, 6.9 mmHg. The mean mitral valve gradient is 4.0 mmHg. Tricuspid Valve: The tricuspid valve is normal in structure. Tricuspid valve regurgitation is mild . No evidence of tricuspid stenosis. Aortic Valve: The aortic valve is normal in structure. Aortic valve regurgitation is not visualized. Aortic valve sclerosis is present, with no evidence of aortic valve stenosis. Aortic valve mean gradient measures 6.0 mmHg. Aortic valve peak gradient measures 8.9 mmHg. Aortic valve area, by VTI measures 2.68 cm. Pulmonic Valve: The pulmonic valve was normal in structure. Pulmonic valve regurgitation is not visualized. No evidence of pulmonic stenosis. Aorta: The aortic root is normal in size and structure. Venous: The inferior vena cava is normal in size with greater than 50% respiratory variability, suggesting right atrial pressure of 3 mmHg. IAS/Shunts: No atrial level shunt detected by color flow Doppler. Additional Comments: 3D was performed not requiring image post processing on an independent workstation and was indeterminate.  LEFT VENTRICLE PLAX 2D LVIDd:         3.00 cm   Diastology LVIDs:         1.90 cm   LV e' medial:    6.85 cm/s LV PW:         0.80 cm   LV E/e' medial:  13.9 LV IVS:        1.00 cm   LV e' lateral:   7.83 cm/s LVOT diam:     2.00 cm   LV E/e' lateral: 12.1 LV SV:         75 LV SV Index:   43 LVOT Area:     3.14 cm  RIGHT VENTRICLE RV Basal diam:  3.30 cm RV Mid diam:    3.30 cm RV S prime:     12.40 cm/s TAPSE (M-mode): 1.3 cm LEFT ATRIUM             Index        RIGHT ATRIUM          Index LA diam:         3.40 cm 1.93 cm/m   RA Area:     9.70 cm LA Vol (A2C):   29.2 ml 16.54 ml/m  RA Volume:   18.30 ml 10.37 ml/m LA Vol (A4C):   30.9 ml 17.50 ml/m LA Biplane Vol: 31.5 ml 17.84 ml/m  AORTIC VALVE AV Area (Vmax):    2.72 cm AV Area (Vmean):   2.73 cm AV Area (VTI):     2.68 cm AV Vmax:           149.00 cm/s AV Vmean:          113.000 cm/s AV VTI:            0.281 m AV Peak Grad:      8.9 mmHg AV Mean Grad:      6.0 mmHg LVOT Vmax:         129.00 cm/s LVOT Vmean:        98.100 cm/s LVOT VTI:  0.240 m LVOT/AV VTI ratio: 0.85  AORTA Ao Root diam: 2.30 cm MITRAL VALVE                TRICUSPID VALVE MV Area (PHT): 2.79 cm     TR Peak grad:   13.4 mmHg MV Area VTI:   2.27 cm     TR Vmax:        183.00 cm/s MV Peak grad:  6.9 mmHg MV Mean grad:  4.0 mmHg     SHUNTS MV Vmax:       1.31 m/s     Systemic VTI:  0.24 m MV Vmean:      94.2 cm/s    Systemic Diam: 2.00 cm MV Decel Time: 272 msec MV E velocity: 94.90 cm/s MV A velocity: 125.00 cm/s MV E/A ratio:  0.76 Evalene Lunger MD Electronically signed by Evalene Lunger MD Signature Date/Time: 10/26/2023/1:19:24 PM    Final    CT ABDOMEN PELVIS W CONTRAST Result Date: 10/26/2023 EXAM: CT ABDOMEN AND PELVIS WITH CONTRAST 10/26/2023 10:58:37 AM TECHNIQUE: CT of the abdomen and pelvis was performed with the administration of 100 mL of iohexol  (OMNIPAQUE ) 300 MG/ML solution. Multiplanar reformatted images are provided for review. Automated exposure control, iterative reconstruction, and/or weight-based adjustment of the mA/kV was utilized to reduce the radiation dose to as low as reasonably achievable. COMPARISON: CT of the abdomen and pelvis dated 02/14/2022. CLINICAL HISTORY: Complicated UTI. Patient arrives from home via [facility], family stated that patient was found on floor of living room. Per patient she did not fall, she states that she sat down on floor due to increased weakness for 1 week. Patient A\T\Ox4. NAD. Denies pain. Per EMS ST 118-120, non  diabetic with CBG of 343. T 100.4 AX. FINDINGS: LOWER CHEST: There is mild bronchiectasis within the lower lobes bilaterally. There is also mild dependent atelectasis slightly worse on the right. There is a trace right-sided pleural effusion. LIVER: The liver is unremarkable. GALLBLADDER AND BILE DUCTS: Gallbladder is unremarkable. No biliary ductal dilatation. SPLEEN: Surgical clips are seen along the ventral surface of the spleen. PANCREAS: No acute abnormality. ADRENAL GLANDS: No acute abnormality. KIDNEYS, URETERS AND BLADDER: There are multiple simple-appearing cysts present within the left kidney. There is an area of diminished attenuation present anteriorly within the superior pole and there is mild adjacent fatty stranding. There are small simple cysts within the right kidney, which is otherwise unremarkable. Per consensus, no follow-up is needed for simple Bosniak type 1 and 2 renal cysts, unless the patient has a malignancy history or risk factors. No stones in the kidneys or ureters. No hydronephrosis. No perinephric or periureteral stranding. Urinary bladder is unremarkable. GI AND BOWEL: The patient is status post gastric surgery. There is mild diffuse thickening of the wall of the sigmoid colon. The small bowel is unremarkable. There is no bowel obstruction. PERITONEUM AND RETROPERITONEUM: No ascites. No free air. VASCULATURE: The abdominal aorta is normal in caliber, but demonstrates dense calcific atheromatous disease. LYMPH NODES: No lymphadenopathy. REPRODUCTIVE ORGANS: The patient is status post hysterectomy and probable right salpingo-oophorectomy. The left ovary is unremarkable. BONES AND SOFT TISSUES: No acute osseous abnormality. No focal soft tissue abnormality. IMPRESSION: 1. Multiple simple-appearing cysts in the left kidney with an area of diminished attenuation and mild adjacent fatty stranding. Findings may represent focal pyelonephritis 2. Mild diffuse thickening of the wall of the  sigmoid colon. Electronically signed by: Evalene Coho MD 10/26/2023 11:21 AM EDT RP Workstation: [id number]   DG  Chest Port 1 View Result Date: 10/25/2023 EXAM: 1 VIEW(S) XRAY OF THE CHEST 10/25/2023 12:01:37 AM COMPARISON: 11/17/2022 CLINICAL HISTORY: Questionable sepsis - evaluate for abnormality. weakness FINDINGS: LUNGS AND PLEURA: Low lung volumes. No focal pulmonary opacity. No pulmonary edema. No pleural effusion. No pneumothorax. HEART AND MEDIASTINUM: No acute abnormality of the cardiac and mediastinal silhouettes. BONES AND SOFT TISSUES: Left upper quadrant surgical clips noted. No acute osseous abnormality. IMPRESSION: 1. No acute cardiopulmonary abnormality. Electronically signed by: Pinkie Pebbles MD 10/25/2023 12:09 AM EDT RP Workstation: [id number]    Microbiology: Recent Results (from the past 240 hours)  Blood Culture (routine x 2)     Status: Abnormal   Collection Time: 10/24/23 11:55 PM   Specimen: BLOOD  Result Value Ref Range Status   Specimen Description   Final    BLOOD RIGHT FOREARM Performed at [facility], [address], [place], [place] [address]    Special Requests   Final    BOTTLES DRAWN AEROBIC AND ANAEROBIC Blood Culture adequate volume Performed at [facility], [address] [place], [place] [address]    Culture  Setup Time   Final    GRAM NEGATIVE RODS ANAEROBIC BOTTLE ONLY Organism ID to follow CRITICAL RESULT CALLED TO, READ BACK BY AND VERIFIED WITH: EMILY STEINBOCK [id number] 10/27/23 MU Performed at [facility], [address] [place], [place] [address]    Culture ESCHERICHIA COLI (A)  Final   Report Status 10/30/2023 FINAL  Final   Organism ID, Bacteria ESCHERICHIA COLI  Final      Susceptibility   Escherichia coli - MIC*    AMPICILLIN >=32 RESISTANT Resistant     CEFAZOLIN (NON-URINE) 4 INTERMEDIATE Intermediate     CEFEPIME <=0.12 SENSITIVE Sensitive     ERTAPENEM <=0.12 SENSITIVE Sensitive      CEFTRIAXONE  <=0.25 SENSITIVE Sensitive     CIPROFLOXACIN <=0.06 SENSITIVE Sensitive     GENTAMICIN <=1 SENSITIVE Sensitive     MEROPENEM <=0.25 SENSITIVE Sensitive     TRIMETH/SULFA <=20 SENSITIVE Sensitive     AMPICILLIN/SULBACTAM >=32 RESISTANT Resistant     PIP/TAZO Value in next row Sensitive      <=4 SENSITIVEThis is a modified FDA-approved test that has been validated and its performance characteristics determined by the reporting laboratory.  This laboratory is certified under the Clinical Laboratory Improvement Amendments CLIA as qualified to perform high complexity clinical laboratory testing.    * ESCHERICHIA COLI  Resp panel by RT-PCR (RSV, Flu A&B, Covid) Urine, Catheterized     Status: None   Collection Time: 10/24/23 11:55 PM   Specimen: Urine, Catheterized; Nasal Swab  Result Value Ref Range Status   SARS Coronavirus 2 by RT PCR NEGATIVE NEGATIVE Final    Comment: (NOTE) SARS-CoV-2 target nucleic acids are NOT DETECTED.  The SARS-CoV-2 RNA is generally detectable in upper respiratory specimens during the acute phase of infection. The lowest concentration of SARS-CoV-2 viral copies this assay can detect is 138 copies/mL. A negative result does not preclude SARS-Cov-2 infection and should not be used as the sole basis for treatment or other patient management decisions. A negative result may occur with  improper specimen collection/handling, submission of specimen other than nasopharyngeal swab, presence of viral mutation(s) within the areas targeted by this assay, and inadequate number of viral copies(<138 copies/mL). A negative result must be combined with clinical observations, patient history, and epidemiological information. The expected result is Negative.  Fact Sheet for Patients:  BloggerCourse.com  Fact Sheet for  Healthcare Providers:  SeriousBroker.it  This test is no t yet approved or cleared by the [place] and  has been authorized for detection and/or diagnosis of SARS-CoV-2 by FDA under an Emergency Use Authorization (EUA). This EUA will remain  in effect (meaning this test can be used) for the duration of the COVID-19 declaration under Section 564(b)(1) of the Act, 21 U.S.C.section 360bbb-3(b)(1), unless the authorization is terminated  or revoked sooner.       Influenza A by PCR NEGATIVE NEGATIVE Final   Influenza B by PCR NEGATIVE NEGATIVE Final    Comment: (NOTE) The Xpert Xpress SARS-CoV-2/FLU/RSV plus assay is intended as an aid in the diagnosis of influenza from Nasopharyngeal swab specimens and should not be used as a sole basis for treatment. Nasal washings and aspirates are unacceptable for Xpert Xpress SARS-CoV-2/FLU/RSV testing.  Fact Sheet for Patients: BloggerCourse.com  Fact Sheet for Healthcare Providers: SeriousBroker.it  This test is not yet approved or cleared by the [place]  FDA and has been authorized for detection and/or diagnosis of SARS-CoV-2 by FDA under an Emergency Use Authorization (EUA). This EUA will remain in effect (meaning this test can be used) for the duration of the COVID-19 declaration under Section 564(b)(1) of the Act, 21 U.S.C. section 360bbb-3(b)(1), unless the authorization is terminated or revoked.     Resp Syncytial Virus by PCR NEGATIVE NEGATIVE Final    Comment: (NOTE) Fact Sheet for Patients: BloggerCourse.com  Fact Sheet for Healthcare Providers: SeriousBroker.it  This test is not yet approved or cleared by the [place]  FDA and has been authorized for detection and/or diagnosis of SARS-CoV-2 by FDA under an Emergency Use Authorization (EUA). This EUA will remain in effect (meaning this test can be used) for the duration of the COVID-19 declaration under Section 564(b)(1) of the Act, 21 U.S.C. section  360bbb-3(b)(1), unless the authorization is terminated or revoked.  Performed at [facility], [address] [place], [place] [address]   Blood Culture ID Panel (Reflexed)     Status: Abnormal   Collection Time: 10/24/23 11:55 PM  Result Value Ref Range Status   Enterococcus faecalis NOT DETECTED NOT DETECTED Final   Enterococcus Faecium NOT DETECTED NOT DETECTED Final   Listeria monocytogenes NOT DETECTED NOT DETECTED Final   Staphylococcus species NOT DETECTED NOT DETECTED Final   Staphylococcus aureus (BCID) NOT DETECTED NOT DETECTED Final   Staphylococcus epidermidis NOT DETECTED NOT DETECTED Final   Staphylococcus lugdunensis NOT DETECTED NOT DETECTED Final   Streptococcus species NOT DETECTED NOT DETECTED Final   Streptococcus agalactiae NOT DETECTED NOT DETECTED Final   Streptococcus pneumoniae NOT DETECTED NOT DETECTED Final   Streptococcus pyogenes NOT DETECTED NOT DETECTED Final   A.calcoaceticus-baumannii NOT DETECTED NOT DETECTED Final   Bacteroides fragilis NOT DETECTED NOT DETECTED Final   Enterobacterales DETECTED (A) NOT DETECTED Final    Comment: Enterobacterales represent a large order of gram negative bacteria, not a single organism. CRITICAL RESULT CALLED TO, READ BACK BY AND VERIFIED WITH: EMILY STEINBOCK 1244 10/27/23 MU    Enterobacter cloacae complex NOT DETECTED NOT DETECTED Final   Escherichia coli DETECTED (A) NOT DETECTED Final    Comment: CRITICAL RESULT CALLED TO, READ BACK BY AND VERIFIED WITH: EMILY STEINBOCK 1244 10/27/23 MU    Klebsiella aerogenes NOT DETECTED NOT DETECTED Final   Klebsiella oxytoca NOT DETECTED NOT DETECTED Final   Klebsiella pneumoniae NOT DETECTED NOT DETECTED Final   Proteus species NOT DETECTED NOT DETECTED Final   Salmonella  species NOT DETECTED NOT DETECTED Final   Serratia marcescens NOT DETECTED NOT DETECTED Final   Haemophilus influenzae NOT DETECTED NOT DETECTED Final   Neisseria meningitidis NOT DETECTED  NOT DETECTED Final   Pseudomonas aeruginosa NOT DETECTED NOT DETECTED Final   Stenotrophomonas maltophilia NOT DETECTED NOT DETECTED Final   Candida albicans NOT DETECTED NOT DETECTED Final   Candida auris NOT DETECTED NOT DETECTED Final   Candida glabrata NOT DETECTED NOT DETECTED Final   Candida krusei NOT DETECTED NOT DETECTED Final   Candida parapsilosis NOT DETECTED NOT DETECTED Final   Candida tropicalis NOT DETECTED NOT DETECTED Final   Cryptococcus neoformans/gattii NOT DETECTED NOT DETECTED Final   CTX-M ESBL NOT DETECTED NOT DETECTED Final   Carbapenem resistance IMP NOT DETECTED NOT DETECTED Final   Carbapenem resistance KPC NOT DETECTED NOT DETECTED Final   Carbapenem resistance NDM NOT DETECTED NOT DETECTED Final   Carbapenem resist OXA 48 LIKE NOT DETECTED NOT DETECTED Final   Carbapenem resistance VIM NOT DETECTED NOT DETECTED Final    Comment: Performed at [facility], [address] [place], [place] [address]  Urine Culture     Status: Abnormal   Collection Time: 10/25/23 12:18 AM   Specimen: Urine, Random  Result Value Ref Range Status   Specimen Description   Final    URINE, RANDOM Performed at [facility], [address] [place], [place] [address]    Special Requests   Final    NONE Reflexed from [id number] Performed at [facility], [address] [place], [place] [address]    Culture >=100,000 COLONIES/mL ESCHERICHIA COLI (A)  Final   Report Status 10/27/2023 FINAL  Final   Organism ID, Bacteria ESCHERICHIA COLI (A)  Final      Susceptibility   Escherichia coli - MIC*    AMPICILLIN >=32 RESISTANT Resistant     CEFAZOLIN (URINE) Value in next row Sensitive      4 SENSITIVEThis is a modified FDA-approved test that has been validated and its performance characteristics determined by the reporting laboratory.  This laboratory is certified under the Clinical Laboratory Improvement Amendments CLIA as qualified to perform high  complexity clinical laboratory testing.    CEFEPIME Value in next row Sensitive      4 SENSITIVEThis is a modified FDA-approved test that has been validated and its performance characteristics determined by the reporting laboratory.  This laboratory is certified under the Clinical Laboratory Improvement Amendments CLIA as qualified to perform high complexity clinical laboratory testing.    ERTAPENEM Value in next row Sensitive      4 SENSITIVEThis is a modified FDA-approved test that has been validated and its performance characteristics determined by the reporting laboratory.  This laboratory is certified under the Clinical Laboratory Improvement Amendments CLIA as qualified to perform high complexity clinical laboratory testing.    CEFTRIAXONE  Value in next row Sensitive      4 SENSITIVEThis is a modified FDA-approved test that has been validated and its performance characteristics determined by the reporting laboratory.  This laboratory is certified under the Clinical Laboratory Improvement Amendments CLIA as qualified to perform high complexity clinical laboratory testing.    CIPROFLOXACIN Value in next row Sensitive      4 SENSITIVEThis is a modified FDA-approved test that has been validated and its performance characteristics determined by the reporting laboratory.  This laboratory is certified under the Clinical Laboratory Improvement Amendments CLIA as qualified to perform high complexity clinical laboratory testing.    GENTAMICIN  Value in next row Sensitive      4 SENSITIVEThis is a modified FDA-approved test that has been validated and its performance characteristics determined by the reporting laboratory.  This laboratory is certified under the Clinical Laboratory Improvement Amendments CLIA as qualified to perform high complexity clinical laboratory testing.    NITROFURANTOIN Value in next row Sensitive      4 SENSITIVEThis is a modified FDA-approved test that has been validated and its  performance characteristics determined by the reporting laboratory.  This laboratory is certified under the Clinical Laboratory Improvement Amendments CLIA as qualified to perform high complexity clinical laboratory testing.    TRIMETH/SULFA Value in next row Sensitive      4 SENSITIVEThis is a modified FDA-approved test that has been validated and its performance characteristics determined by the reporting laboratory.  This laboratory is certified under the Clinical Laboratory Improvement Amendments CLIA as qualified to perform high complexity clinical laboratory testing.    AMPICILLIN/SULBACTAM Value in next row Resistant      4 SENSITIVEThis is a modified FDA-approved test that has been validated and its performance characteristics determined by the reporting laboratory.  This laboratory is certified under the Clinical Laboratory Improvement Amendments CLIA as qualified to perform high complexity clinical laboratory testing.    PIP/TAZO Value in next row Sensitive      <=4 SENSITIVEThis is a modified FDA-approved test that has been validated and its performance characteristics determined by the reporting laboratory.  This laboratory is certified under the Clinical Laboratory Improvement Amendments CLIA as qualified to perform high complexity clinical laboratory testing.    MEROPENEM Value in next row Sensitive      <=4 SENSITIVEThis is a modified FDA-approved test that has been validated and its performance characteristics determined by the reporting laboratory.  This laboratory is certified under the Clinical Laboratory Improvement Amendments CLIA as qualified to perform high complexity clinical laboratory testing.    * >=100,000 COLONIES/mL ESCHERICHIA COLI  Blood Culture (routine x 2)     Status: None   Collection Time: 10/25/23 12:48 AM   Specimen: BLOOD  Result Value Ref Range Status   Specimen Description BLOOD BLOOD RIGHT ARM  Final   Special Requests   Final    BOTTLES DRAWN AEROBIC AND  ANAEROBIC Blood Culture results may not be optimal due to an inadequate volume of blood received in culture bottles   Culture   Final    NO GROWTH 5 DAYS Performed at [facility], [address], [place], [place] [address]    Report Status 10/30/2023 FINAL  Final     Labs: Basic Metabolic Panel: Recent Labs  Lab 10/26/23 1246 10/27/23 0026 10/27/23 1045 10/28/23 0337 10/29/23 0429 10/30/23 0358  NA  --  142  --  142 143 142  K  --  3.4*  --  5.3* 3.9 4.1  CL  --  107  --  111 107 105  CO2  --  22  --  24 25 27   GLUCOSE  --  137*  --  105* 107* 97  BUN  --  9  --  11 16 19   CREATININE  --  0.92 0.81 1.08* 0.67 0.74  CALCIUM   --  7.6*  --  8.1* 8.5* 8.5*  MG 1.8  --   --   --   --   --    Liver Function Tests: No results for input(s): AST, ALT, ALKPHOS, BILITOT, PROT, ALBUMIN in the last 168 hours. No results  for input(s): LIPASE, AMYLASE in the last 168 hours. No results for input(s): AMMONIA in the last 168 hours. CBC: Recent Labs  Lab 10/27/23 0026 10/27/23 1045 10/28/23 0337  WBC 6.1 5.6 8.2  HGB 9.5* 9.9* 10.7*  HCT 30.4* 31.0* 34.0*  MCV 93.8 91.7 92.6  PLT 208 204 274   Cardiac Enzymes: Recent Labs  Lab 10/30/23 0358 10/31/23 0541  CKTOTAL 2,488* 878*   BNP: BNP (last 3 results) No results for input(s): BNP in the last 8760 hours.  ProBNP (last 3 results) No results for input(s): PROBNP in the last 8760 hours.  CBG: No results for input(s): GLUCAP in the last 168 hours.     Signed:  Devaughn KATHEE Ban MD.  [facility] 11/02/2023, 9:47 AM

## 2023-11-02 NOTE — Progress Notes (Signed)
 Report Called to Grayce Salt LPN @ [facility] no questions at this time . Patient awaiting transport to facility.

## 2023-11-02 NOTE — Progress Notes (Signed)
 Physical Therapy Treatment Patient Details Name: Joan Baldwin MRN: [id number] DOB: 09/19/1945 Today's Date: 11/02/2023   History of Present Illness Joan Baldwin is a 78 y.o. female with medical history significant for HTN, depression and anxiety, chronic hepatitis C, chronic severe constipation being admitted with sepsis due to possible UTI after presenting with a 1 week history of weakness.  Per EMS family found patient on the floor of the living room.  Patient denies falling but stated she was so weak she had to sit down on the floor.  She denied pain.  Denied nausea, vomiting or abdominal pain, cough, chest pain, fever or chills, change in bowel habits from baseline  In the ED Tmax 100.8, tachycardic to 108 tachypneic to 26 with normal blood pressures, O2 sat 100% on room air.Labs notable for WBC 16,000 and lactic acid 3.2 with UA strongly positive for UTI with positive nitrites and large leukocyte esterase with many bacteria.  Respiratory viral panel was negative    PT Comments  Patient seen for PT session focused on OOB mobilitu. Patient required minA with ambulation able to ambulate 50 feet RW. Tolerated session well with no signs of exertion or distress. Vitals remained stable during activity. Main limiting factors today were fatigue. Interventions aimed at improving OOB mobility. Continued skilled PT recommended to progress toward functional goals and support discharge readiness.    If plan is discharge home, recommend the following: Two people to help with walking and/or transfers;Two people to help with bathing/dressing/bathroom   Can travel by private vehicle     No  Equipment Recommendations       Recommendations for Other Services       Precautions / Restrictions Precautions Precautions: Fall Restrictions Weight Bearing Restrictions Per Provider Order: No     Mobility  Bed Mobility Overal bed mobility: Needs Assistance Bed Mobility: Rolling, Sidelying to  Sit Rolling: Min assist Sidelying to sit: Min assist       General bed mobility comments: vc for hand placement and movement sequence    Transfers Overall transfer level: Needs assistance Equipment used: Rolling walker (2 wheels) Transfers: Sit to/from Stand Sit to Stand: Min assist                Ambulation/Gait Ambulation/Gait assistance: Mod assist, Min assist, +2 physical assistance Gait Distance (Feet): 50 Feet Assistive device: Rolling walker (2 wheels) Gait Pattern/deviations: Step-to pattern, Drifts right/left, Trunk flexed, Narrow base of support, Shuffle, Knees buckling, Decreased stride length       General Gait Details: attempted gait with RW; Pt. mod assist to start and regressed to Total with complete LOB (pt. did not hit the floor, was caught by therapist); pt needed total physical assistance back to bed   Stairs             Wheelchair Mobility     Tilt Bed    Modified Rankin (Stroke Patients Only)       Balance Overall balance assessment: Needs assistance Sitting-balance support: Feet supported Sitting balance-Leahy Scale: Fair                                      Hotel manager: No apparent difficulties  Cognition Arousal: Alert Behavior During Therapy: WFL for tasks assessed/performed   PT - Cognitive impairments: No apparent impairments  Following commands: Intact      Cueing Cueing Techniques: Verbal cues  Exercises      General Comments        Pertinent Vitals/Pain Pain Assessment Pain Assessment: No/denies pain    Home Living                          Prior Function            PT Goals (current goals can now be found in the care plan section) Acute Rehab PT Goals Patient Stated Goal: pt. wants to get stronger PT Goal Formulation: With patient Time For Goal Achievement: 11/10/23 Potential to Achieve Goals: Fair Progress  towards PT goals: Progressing toward goals    Frequency    Min 3X/week      PT Plan      Co-evaluation              AM-PAC PT 6 Clicks Mobility   Outcome Measure  Help needed turning from your back to your side while in a flat bed without using bedrails?: A Little Help needed moving from lying on your back to sitting on the side of a flat bed without using bedrails?: A Little Help needed moving to and from a bed to a chair (including a wheelchair)?: A Little Help needed standing up from a chair using your arms (e.g., wheelchair or bedside chair)?: A Little Help needed to walk in hospital room?: A Lot Help needed climbing 3-5 steps with a railing? : Total 6 Click Score: 15    End of Session Equipment Utilized During Treatment: Gait belt Activity Tolerance: Patient tolerated treatment well Patient left: in bed;with bed alarm set Nurse Communication: Mobility status PT Visit Diagnosis: Unsteadiness on feet (R26.81);Muscle weakness (generalized) (M62.81)     Time: 8955-8945 PT Time Calculation (min) (ACUTE ONLY): 10 min  Charges:    $Therapeutic Activity: 8-22 mins PT General Charges $$ ACUTE PT VISIT: 1 Visit                     Joan Baldwin DPT, PT     Joan Baldwin 11/02/2023, 11:04 AM

## 2023-11-02 NOTE — TOC Transition Note (Signed)
 Transition of Care [facility]) - Discharge Note   Patient Details  Name: Joan Baldwin MRN: [id number] Date of Birth: [birth year]  [facility] CM/SW Contact:  Joan Rummer, LCSW Phone Number: 11/02/2023, 9:40 AM   Clinical Narrative:    Joan Baldwin Baldwin met with patient in room 233 to advise of discharge plan. Pt will transition to [facility]. Daughter in Joan Baldwin [phone] was contacted via phone and advise of pt discharge. No further TOC needs identified. TOC signing off.    Final next level of care: Skilled Nursing Facility Barriers to Discharge: Barriers Resolved   Patient Goals and CMS Choice     Choice offered to / list presented to : Patient      Discharge Placement             [facility] skilled nursing facility via [facility]     Name of family member notified: daughter in Joan Baldwin [phone] Patient and family notified of of transfer: 11/02/23  Discharge Plan and Services Additional resources added to the After Visit Summary for                                       Social Drivers of Health (SDOH) Interventions SDOH Screenings   Food Insecurity: No Food Insecurity (10/29/2023)  Housing: Low Risk  (10/29/2023)  Transportation Needs: No Transportation Needs (10/29/2023)  Utilities: Not At Risk (10/29/2023)  Alcohol Screen: Low Risk  (06/22/2023)  Depression (PHQ2-9): Low Risk  (06/22/2023)  Recent Concern: Depression (PHQ2-9) - Medium Risk (06/17/2023)  Financial Resource Strain: Low Risk  (06/22/2023)  Physical Activity: Insufficiently Active (06/22/2023)  Social Connections: Moderately Isolated (10/29/2023)  Stress: No Stress Concern Present (06/22/2023)  Tobacco Use: Medium Risk (10/24/2023)  Health Literacy: Adequate Health Literacy (06/22/2023)     Readmission Risk Interventions     No data to display

## 2023-11-02 NOTE — Plan of Care (Signed)
  Problem: Education: Goal: Understanding of cardiac disease, CV risk reduction, and recovery process will improve Outcome: Progressing   Problem: Activity: Goal: Ability to tolerate increased activity will improve Outcome: Progressing   Problem: Cardiac: Goal: Ability to achieve and maintain adequate cardiovascular perfusion will improve Outcome: Progressing   Problem: Education: Goal: Knowledge of General Education information will improve Description: Including pain rating scale, medication(s)/side effects and non-pharmacologic comfort measures Outcome: Progressing   Problem: Clinical Measurements: Goal: Ability to maintain clinical measurements within normal limits will improve Outcome: Progressing Goal: Will remain free from infection Outcome: Progressing Goal: Cardiovascular complication will be avoided Outcome: Progressing   Problem: Coping: Goal: Level of anxiety will decrease Outcome: Progressing   Problem: Elimination: Goal: Will not experience complications related to bowel motility Outcome: Progressing   Problem: Pain Managment: Goal: General experience of comfort will improve and/or be controlled Outcome: Progressing

## 2023-11-03 NOTE — Telephone Encounter (Signed)
 Verbal order placed waiting on provider to sign

## 2023-11-04 DIAGNOSIS — M6281 Muscle weakness (generalized): Secondary | ICD-10-CM | POA: Diagnosis not present

## 2023-11-04 DIAGNOSIS — I214 Non-ST elevation (NSTEMI) myocardial infarction: Secondary | ICD-10-CM | POA: Diagnosis not present

## 2023-11-04 DIAGNOSIS — R2689 Other abnormalities of gait and mobility: Secondary | ICD-10-CM | POA: Diagnosis not present

## 2023-11-08 ENCOUNTER — Telehealth: Payer: Self-pay

## 2023-11-08 NOTE — Transitions of Care (Post Inpatient/ED Visit) (Signed)
 11/08/2023  Name: Joan Baldwin MRN: [id number] DOB: 1945-05-19  Today's TOC FU Call Status: Today's TOC FU Call Status:: Successful TOC FU Call Completed TOC FU Call Complete Date: 11/08/23 Patient's Name and Date of Birth confirmed.  Transition Care Management Follow-up Telephone Call Date of Discharge: 11/07/23 Discharge Facility: Other ([facility]) Name of Other ([facility]) Discharge Facility: [facility] Type of Discharge: Inpatient Admission Primary Inpatient Discharge Diagnosis:: UTI How have you been since you were released from the hospital?: Better Any questions or concerns?: No  Items Reviewed: Did you receive and understand the discharge instructions provided?: Yes Medications obtained,verified, and reconciled?: Yes (Medications Reviewed) Any new allergies since your discharge?: No Dietary orders reviewed?: Yes Do you have support at home?: Yes People in Home [RPT]: other relative(s)  Medications Reviewed Today: Medications Reviewed Today     Reviewed by Emmitt Pan, LPN (Licensed Practical Nurse) on 11/08/23 at 0901  Med List Status: <None>   Medication Order Taking? Sig Documenting Provider Last Dose Status Informant  alendronate  (FOSAMAX ) 70 MG tablet [id number] Yes TAKE 1 TABLET BY MOUTH EVERY 7 DAYS. TAKE WITH A FULL GLASS OF WATER ON EMPTY STOMACH Gasper Nancyann BRAVO, MD  Active Child  amLODipine  (NORVASC ) 5 MG tablet [id number] Yes Take 1 tablet by mouth once daily Gasper Nancyann BRAVO, MD  Active Child  aspirin  EC 81 MG tablet [id number] Yes Take 1 tablet (81 mg total) by mouth daily. Swallow whole. Kandis Devaughn Sayres, MD  Active   Calcium  Carb-Cholecalciferol 600-12.5 MG-MCG CAPS [id number] Yes Take 1 tablet by mouth daily. [provider]  Active Child  diazepam  (VALIUM ) 10 MG tablet [id number] Yes TAKE 1/2 TO 1 (ONE-HALF TO ONE) TABLET BY MOUTH 4 TIMES DAILY AS NEEDED FOR ANXIETY Wouk, Devaughn Sayres, MD  Active   Fiber POWD [id number] Yes Take  2 scoop by mouth daily. [provider]  Active Child           Med Note (GENTLE, JEB C   Sat Jan 17, 2016  1:31 PM) Takes almost every day  lisinopril  (ZESTRIL ) 10 MG tablet [id number] Yes Take 1 tablet (10 mg total) by mouth daily. Wouk, Devaughn Sayres, MD  Active   lubiprostone  (AMITIZA ) 24 MCG capsule [id number] Yes Take 1 capsule (24 mcg total) by mouth 2 (two) times daily with a meal. Gasper Nancyann BRAVO, MD  Active Child  Multiple Vitamins-Minerals (CENTRUM SILVER) tablet [id number] Yes  [provider]  Active Child  nicotine (NICODERM CQ - DOSED IN MG/24 HOURS) 21 mg/24hr patch [id number] Yes Place 1 patch (21 mg total) onto the skin daily. Wouk, Devaughn Sayres, MD  Active   omeprazole  (PRILOSEC) 20 MG capsule [id number] Yes Take 1 capsule by mouth once daily Gasper Nancyann BRAVO, MD  Active Child  QUEtiapine  (SEROQUEL ) 50 MG tablet [id number] Yes TAKE 1 TABLET BY MOUTH AT BEDTIME Gasper Nancyann BRAVO, MD  Active Child  traZODone  (DESYREL ) 100 MG tablet [id number] Yes TAKE 1 TABLET BY MOUTH AT BEDTIME Gasper Nancyann BRAVO, MD  Active Child            Home Care and Equipment/Supplies: Were Home Health Services Ordered?: NA Any new equipment or medical supplies ordered?: NA  Functional Questionnaire: Do you need assistance with bathing/showering or dressing?: No Do you need assistance with meal preparation?: No Do you need assistance with eating?: No Do you have difficulty maintaining continence: No Do you need assistance with getting out of bed/getting out of a chair/moving?: No Do you have  difficulty managing or taking your medications?: No  Follow up appointments reviewed: PCP Follow-up appointment confirmed?: Yes Date of PCP follow-up appointment?: 11/16/23 Follow-up Provider: [facility] Follow-up appointment confirmed?: No Reason Specialist Follow-Up Not Confirmed: Patient has Specialist Provider Number and will Call for Appointment Do you need transportation to  your follow-up appointment?: No Do you understand care options if your condition(s) worsen?: Yes-patient verbalized understanding    SIGNATURE Julian Lemmings, LPN [facility] Nurse Health Advisor Direct Dial [phone]

## 2023-11-09 NOTE — Telephone Encounter (Signed)
 Need to know size and how many she uses in a day

## 2023-11-09 NOTE — Telephone Encounter (Signed)
 Size: Medium 3-4 diapers a day

## 2023-11-09 NOTE — Telephone Encounter (Signed)
 Order written, signed and left at my workstation.

## 2023-11-10 NOTE — Telephone Encounter (Signed)
 Faxed to [facility] [phone]

## 2023-11-11 ENCOUNTER — Telehealth: Payer: Self-pay | Admitting: Family Medicine

## 2023-11-14 NOTE — Telephone Encounter (Signed)
 Called spoke with Rosaline and made her aware

## 2023-11-16 ENCOUNTER — Encounter: Payer: Self-pay | Admitting: Family Medicine

## 2023-11-16 ENCOUNTER — Ambulatory Visit (INDEPENDENT_AMBULATORY_CARE_PROVIDER_SITE_OTHER): Admitting: Family Medicine

## 2023-11-16 VITALS — BP 122/54 | HR 80 | Ht 67.0 in | Wt 149.9 lb

## 2023-11-16 DIAGNOSIS — N12 Tubulo-interstitial nephritis, not specified as acute or chronic: Secondary | ICD-10-CM | POA: Diagnosis not present

## 2023-11-16 DIAGNOSIS — R7989 Other specified abnormal findings of blood chemistry: Secondary | ICD-10-CM | POA: Diagnosis not present

## 2023-11-16 DIAGNOSIS — T796XXD Traumatic ischemia of muscle, subsequent encounter: Secondary | ICD-10-CM | POA: Diagnosis not present

## 2023-11-16 LAB — POCT URINALYSIS DIPSTICK
Bilirubin, UA: NEGATIVE
Glucose, UA: NEGATIVE
Ketones, UA: NEGATIVE
Nitrite, UA: NEGATIVE
Protein, UA: NEGATIVE
Spec Grav, UA: 1.01 (ref 1.010–1.025)
Urobilinogen, UA: 0.2 U/dL
pH, UA: 6 (ref 5.0–8.0)

## 2023-11-16 NOTE — Progress Notes (Deleted)
 Follow up Hospitalization  Patient was admitted to [facility] on 10/24/23 and discharged on 11/02/23. She was treated for sepsis secondary to UTI. Treatment for this included EKG, Labs, CXR, sepsis fluids started on Rocephin  Also started on a heparin infusion due to elevated troponin in the 600s . Telephone follow up was done on 11/08/23 She reports {excellent/good/fair:19992} compliance with treatment. She reports this condition is {resolved/improved/worsened:23923}.  ----------------------------------------------------------------------------------------- -

## 2023-11-17 ENCOUNTER — Ambulatory Visit: Payer: Self-pay | Admitting: Family Medicine

## 2023-11-17 LAB — CBC
Hematocrit: 39.9 % (ref 34.0–46.6)
Hemoglobin: 12.5 g/dL (ref 11.1–15.9)
MCH: 29.3 pg (ref 26.6–33.0)
MCHC: 31.3 g/dL — ABNORMAL LOW (ref 31.5–35.7)
MCV: 93 fL (ref 79–97)
Platelets: 398 x10E3/uL (ref 150–450)
RBC: 4.27 x10E6/uL (ref 3.77–5.28)
RDW: 12.5 % (ref 11.7–15.4)
WBC: 8.3 x10E3/uL (ref 3.4–10.8)

## 2023-11-17 LAB — COMPREHENSIVE METABOLIC PANEL WITH GFR
ALT: 8 IU/L (ref 0–32)
AST: 14 IU/L (ref 0–40)
Albumin: 4.4 g/dL (ref 3.8–4.8)
Alkaline Phosphatase: 44 IU/L — ABNORMAL LOW (ref 49–135)
BUN/Creatinine Ratio: 13 (ref 12–28)
BUN: 13 mg/dL (ref 8–27)
Bilirubin Total: 0.2 mg/dL (ref 0.0–1.2)
CO2: 24 mmol/L (ref 20–29)
Calcium: 10 mg/dL (ref 8.7–10.3)
Chloride: 103 mmol/L (ref 96–106)
Creatinine, Ser: 0.99 mg/dL (ref 0.57–1.00)
Globulin, Total: 2.7 g/dL (ref 1.5–4.5)
Glucose: 133 mg/dL — ABNORMAL HIGH (ref 70–99)
Potassium: 5.8 mmol/L — ABNORMAL HIGH (ref 3.5–5.2)
Sodium: 140 mmol/L (ref 134–144)
Total Protein: 7.1 g/dL (ref 6.0–8.5)
eGFR: 58 mL/min/1.73 — ABNORMAL LOW (ref >59)

## 2023-11-17 LAB — CK: Total CK: 54 U/L (ref 32–182)

## 2023-11-17 NOTE — Telephone Encounter (Signed)
 Its a little bit of both. She can cut back to 1/2 banana or 1 banana every other day, but she still needs to reduce the dose of lisinopril .

## 2023-11-23 LAB — URINE CULTURE

## 2023-11-24 ENCOUNTER — Other Ambulatory Visit: Payer: Self-pay | Admitting: Student in an Organized Health Care Education/Training Program

## 2023-11-26 ENCOUNTER — Other Ambulatory Visit: Payer: Self-pay | Admitting: Family Medicine

## 2023-12-01 ENCOUNTER — Other Ambulatory Visit: Payer: Self-pay | Admitting: Family Medicine

## 2023-12-01 DIAGNOSIS — F419 Anxiety disorder, unspecified: Secondary | ICD-10-CM

## 2023-12-05 NOTE — Progress Notes (Signed)
 Established patient visit   Patient: Joan Baldwin   DOB: 07-07-1945   78 y.o. Female  MRN: [id number] Visit Date: 11/16/2023  Today's healthcare provider: Nancyann Perry, MD   Chief Complaint  Patient presents with   Hospitalization Follow-up    Patient was admitted to [facility] on 10/24/23 and discharged on 11/02/23. She was treated for sepsis secondary to UTI. Treatment for this included EKG, Labs, CXR, sepsis fluids started on Rocephin  Also started on a heparin  infusion due to elevated troponin in the 600s . Telephone follow up was done on 11/08/23 She reports excellent compliance with treatment. She reports this condition is resolved.   Subjective    Discussed the use of AI scribe software for clinical note transcription with the patient, who gave verbal consent to proceed.  History of Present Illness   Joan Baldwin is an 78 year old female who presents for hospital follow-up after treatment for sepsis secondary to a urinary tract infection.  She was hospitalized at [facility] on October 24, 2023, for sepsis secondary to a urinary tract infection. During her hospitalization, a CT scan of the abdomen revealed multiple cysts in the left kidney consistent with pyelonephritis. She was treated with intravenous ceftriaxone  and discharged after six to seven days, followed by a short stay in a rehabilitation facility before returning home on November 07, 2023.  Prior to admission, she experienced rhabdomyolysis after being found down at her home. Her troponin levels were elevated during her hospitalization. Since returning home, she feels fine with no fevers, chills, or sweats. No nausea or stomach sickness. She has been drinking plenty of fluids, including water, V8, and coffee. Her son reports that she has been walking regularly since his return from a trip to [place] .  During her hospital stay, she received a flu shot and a blood thinner injection in the  stomach for a few days to prevent blood clots while she was not mobile. She accidentally spilled her medication when she fell, but her son managed to recover most of it. She does not currently need a refill as she still has some medication left. Her son assists with her medication management by preparing her weekly pill bottles every Saturday night.       Medications: Outpatient Medications Prior to Visit  Medication Sig   alendronate  (FOSAMAX ) 70 MG tablet TAKE 1 TABLET BY MOUTH EVERY 7 DAYS. TAKE WITH A FULL GLASS OF WATER ON EMPTY STOMACH   amLODipine  (NORVASC ) 5 MG tablet Take 1 tablet by mouth once daily   aspirin  EC 81 MG tablet Take 1 tablet (81 mg total) by mouth daily. Swallow whole.   Calcium  Carb-Cholecalciferol 600-12.5 MG-MCG CAPS Take 1 tablet by mouth daily.   Fiber POWD Take 2 scoop by mouth daily.   lubiprostone  (AMITIZA ) 24 MCG capsule Take 1 capsule (24 mcg total) by mouth 2 (two) times daily with a meal.   Multiple Vitamins-Minerals (CENTRUM SILVER) tablet    nicotine  (NICODERM CQ  - DOSED IN MG/24 HOURS) 21 mg/24hr patch Place 1 patch (21 mg total) onto the skin daily.   omeprazole  (PRILOSEC) 20 MG capsule Take 1 capsule by mouth once daily   QUEtiapine  (SEROQUEL ) 50 MG tablet TAKE 1 TABLET BY MOUTH AT BEDTIME   traZODone  (DESYREL ) 100 MG tablet TAKE 1 TABLET BY MOUTH AT BEDTIME   [DISCONTINUED] diazepam  (VALIUM ) 10 MG tablet TAKE 1/2 TO 1 (ONE-HALF TO ONE) TABLET BY MOUTH 4 TIMES DAILY AS NEEDED  FOR ANXIETY   [DISCONTINUED] lisinopril  (ZESTRIL ) 10 MG tablet Take 1 tablet (10 mg total) by mouth daily.   No facility-administered medications prior to visit.   Review of Systems     Objective    BP (!) 122/54 (BP Location: Left Arm, Patient Position: Sitting, Cuff Size: Normal)   Pulse 80   Ht 5' 7 (1.702 m)   Wt 149 lb 14.4 oz (68 kg)   SpO2 99%   BMI 23.48 kg/m   Physical Exam   General: Appearance:    Well developed, well nourished female in no acute distress   Eyes:    PERRL, conjunctiva/corneas clear, EOM's intact       Lungs:     Clear to auscultation bilaterally, respirations unlabored  Heart:    Normal heart rate. Normal rhythm. No murmurs, rubs, or gallops.    MS:   All extremities are intact.    Neurologic:   Awake, alert, oriented x 3. No apparent focal neurological defect.           Assessment & Plan       Sepsis secondary to urinary tract infection with pyelonephritis and multiple left renal cysts Hospitalized and treated with IV ceftriaxone . Currently asymptomatic. Requires confirmation of infection resolution. - Order urinalysis to confirm resolution of infection.  Rhabdomyolysis Rhabdomyolysis due to being found down at home prior to admission. Requires follow-up to ensure resolution. - Order blood work to assess resolution of rhabdomyolysis.  Demand ischemia (myocardial injury) Elevated troponins likely due to demand ischemia from infection. Cardiologist follow-up recommended to rule out underlying CAD. - Refer to cardiologist for follow-up evaluation.         Nancyann Perry, MD  [facility] [phone] (phone) [phone] (fax)  [facility]

## 2023-12-21 ENCOUNTER — Encounter: Payer: Self-pay | Admitting: Internal Medicine

## 2023-12-21 ENCOUNTER — Ambulatory Visit: Attending: Internal Medicine | Admitting: Internal Medicine

## 2023-12-21 VITALS — BP 120/58 | HR 85 | Ht 67.0 in | Wt 155.8 lb

## 2023-12-21 DIAGNOSIS — R072 Precordial pain: Secondary | ICD-10-CM

## 2023-12-21 DIAGNOSIS — E875 Hyperkalemia: Secondary | ICD-10-CM

## 2023-12-21 DIAGNOSIS — Z79899 Other long term (current) drug therapy: Secondary | ICD-10-CM

## 2023-12-21 DIAGNOSIS — I10 Essential (primary) hypertension: Secondary | ICD-10-CM

## 2023-12-21 DIAGNOSIS — R7989 Other specified abnormal findings of blood chemistry: Secondary | ICD-10-CM | POA: Diagnosis not present

## 2023-12-21 NOTE — Progress Notes (Signed)
 Cardiology Office Note:  .   Date:  12/21/2023  ID:  Joan Baldwin, DOB 01-Feb-1945, MRN [id number] PCP: Gasper Joan BRAVO, MD  Joan Baldwin Providers Cardiologist:  Joan Hanson, MD     History of Present Illness: .   Joan Baldwin is a 78 y.o. female with history of hypertension, hepatitis C, chronic constipation, tobacco abuse, and depression/anxiety, who presents for follow-up of elevated troponin.  I met her during hospitalization in mid October for 1 week of generalized fatigue and malaise.  She suffered a fall and was on the ground for an extended period of time.  She denied any chest pain but was incidentally found to have a troponin up to 978 on the last check.  There was also evidence of rhabdomyolysis on presentation with initial creatine kinase of 3987 (subsequently downtrending).  Echocardiogram during that admission showed normal LVEF without wall motion abnormality or significant valvular heart disease.  Today, Joan Baldwin reports that she is feeling well, denying chest pain, shortness of breath, palpitations, lightheadedness, and edema.  Her family thinks that her legs may still seem a bit weak, though Joan Baldwin seems to be getting around okay at home per her report.  ROS: See HPI  Studies Reviewed: Joan Baldwin   EKG Interpretation Date/Time:  Wednesday December 21 2023 14:10:09 EST Ventricular Rate:  85 PR Interval:  148 QRS Duration:  74 QT Interval:  352 QTC Calculation: 418 R Axis:   38  Text Interpretation: Normal sinus rhythm Nonspecific T wave abnormality Abnormal ECG When compared with ECG of 26-Oct-2023 00:08, QT has shortened Confirmed by Joan Baldwin ([id number]) on 12/21/2023 2:17:57 PM    TTE (10/26/2023): Normal LV size with mild LVH.  LVEF 60-65% with normal wall motion and grade 1 diastolic dysfunction.  Normal RV size and function.  Normal PA pressure.  Normal biatrial size.  Normal mitral valve.  Mild tricuspid regurgitation.  Aortic sclerosis  without stenosis.  Normal CVP.  Risk Assessment/Calculations:             Physical Exam:   VS:  BP (!) 120/58 (BP Location: Right Arm, Patient Position: Sitting, Cuff Size: Normal)   Pulse 85   Ht 5' 7 (1.702 m)   Wt 155 lb 12.8 oz (70.7 kg)   SpO2 97%   BMI 24.40 kg/m    Wt Readings from Last 3 Encounters:  12/21/23 155 lb 12.8 oz (70.7 kg)  11/16/23 149 lb 14.4 oz (68 kg)  10/24/23 153 lb (69.4 kg)    General:  NAD. Neck: No JVD or HJR. Lungs: Clear to auscultation bilaterally without wheezes or crackles. Heart: Regular rate and rhythm without murmurs, rubs, or gallops. Abdomen: Soft, nontender, nondistended. Extremities: No lower extremity edema.  ASSESSMENT AND PLAN: .    Elevated troponin: Incidentally noted in the setting of fall related to UTI and complicated by rhabdomyolysis.  Joan Baldwin denies ever having chest pain.  Her echocardiogram during her hospitalization was also unrevealing without evidence of wall motion abnormality or cardiomyopathy.  However, given her cardiac risk factors and elevated troponin that was still rising on last check and near 1000 during her hospitalization in October, we have agreed to perform a pharmacologic myocardial perfusion stress test to ensure that she does not have obstructive CAD.  I would favor deferring catheterization/PCI unless she has high risk findings on her stress test, given her lack of symptoms and comorbidities.  Check lipid panel for risk stratification as well.  If lipids are  significantly elevated and/or there is evidence of significant ASCVD, I think it would be reasonable to add a statin given that her CK level has normalized on last check in early November.  Hypertension: Blood pressure well-controlled today.  Continue current medications.  Hyperkalemia: Most recent chemistry in early November was notable for mild potassium elevation at 5.8.  Joan Baldwin has been trying to limit her dietary potassium intake.  We  will repeat a BMP today to ensure that it is normal.    Informed Consent   Shared Decision Making/Informed Consent The risks [chest pain, shortness of breath, cardiac arrhythmias, dizziness, blood pressure fluctuations, myocardial infarction, stroke/transient ischemic attack, nausea, vomiting, allergic reaction, radiation exposure, metallic taste sensation and life-threatening complications (estimated to be 1 in 10,000)], benefits (risk stratification, diagnosing coronary artery disease, treatment guidance) and alternatives of a nuclear stress test were discussed in detail with Joan Baldwin and she agrees to proceed.     Dispo: Return to clinic in 1 month.  Signed, Joan Hanson, MD

## 2023-12-21 NOTE — Patient Instructions (Signed)
 Medication Instructions:  Your physician recommends that you continue on your current medications as directed. Please refer to the Current Medication list given to you today.   *If you need a refill on your cardiac medications before your next appointment, please call your pharmacy*  Lab Work: Your provider would like for you to have following labs drawn today BMP and Lipid Panel.   If you have labs (blood work) drawn today and your tests are completely normal, you will receive your results only by: MyChart Message (if you have MyChart) OR A paper copy in the mail If you have any lab test that is abnormal or we need to change your treatment, we will call you to review the results.  Testing/Procedures: Your provider has ordered a Lexiscan/ Exercise Myoview Stress test. This will take place at [facility]. Please report to the [facility] medical mall entrance. The volunteers at the first desk will direct you where to go.  [facility] MYOVIEW  Your provider has ordered a Stress Test with nuclear imaging. The purpose of this test is to evaluate the blood supply to your heart muscle. This procedure is referred to as a Non-Invasive Stress Test. This is because other than having an IV started in your vein, nothing is inserted or invades your body. Cardiac stress tests are done to find areas of poor blood flow to the heart by determining the extent of coronary artery disease (CAD). Some patients exercise on a treadmill, which naturally increases the blood flow to your heart, while others who are unable to walk on a treadmill due to physical limitations will have a pharmacologic/chemical stress agent called Lexiscan . This medicine will mimic walking on a treadmill by temporarily increasing your coronary blood flow.   Please note: these test may take anywhere between 2-4 hours to complete  How to prepare for your Myoview test:  Nothing to eat for 6 hours prior to the test No caffeine for 24 hours prior to test No  smoking 24 hours prior to test. Your medication may be taken with water.  If your doctor stopped a medication because of this test, do not take that medication. Ladies, please do not wear dresses.  Skirts or pants are appropriate. Please wear a short sleeve shirt. No perfume, cologne or lotion. Wear comfortable walking shoes. No heels!   PLEASE NOTIFY THE OFFICE AT LEAST 24 HOURS IN ADVANCE IF YOU ARE UNABLE TO KEEP YOUR APPOINTMENT.  [phone] AND  PLEASE NOTIFY NUCLEAR MEDICINE AT [facility] AT LEAST 24 HOURS IN ADVANCE IF YOU ARE UNABLE TO KEEP YOUR APPOINTMENT. [phone]   Follow-Up: At [facility], you and your health needs are our priority.  As part of our continuing mission to provide you with exceptional heart care, our providers are all part of one team.  This team includes your primary Cardiologist (physician) and Advanced Practice Providers or APPs (Physician Assistants and Nurse Practitioners) who all work together to provide you with the care you need, when you need it.  Your next appointment:   1 month(s)  Provider:   You may see Lonni Hanson, MD or one of the following Advanced Practice Providers on your designated Care Team:   Lonni Meager, NP Lesley Maffucci, PA-C Bernardino Bring, PA-C Cadence Galt, PA-C Tylene Lunch, NP Barnie Hila, NP    We recommend signing up for the patient portal called MyChart.  Sign up information is provided on this After Visit Summary.  MyChart is used to connect with patients for Virtual  Visits (Telemedicine).  Patients are able to view lab/test results, encounter notes, upcoming appointments, etc.  Non-urgent messages can be sent to your provider as well.   To learn more about what you can do with MyChart, go to [email or web address].

## 2023-12-22 ENCOUNTER — Ambulatory Visit: Payer: Self-pay | Admitting: Internal Medicine

## 2023-12-22 ENCOUNTER — Encounter: Payer: Self-pay | Admitting: Internal Medicine

## 2023-12-22 LAB — LIPID PANEL
Chol/HDL Ratio: 4.1 ratio (ref 0.0–4.4)
Cholesterol, Total: 205 mg/dL — ABNORMAL HIGH (ref 100–199)
HDL: 50 mg/dL (ref >39)
LDL Chol Calc (NIH): 115 mg/dL — ABNORMAL HIGH (ref 0–99)
Triglycerides: 232 mg/dL — ABNORMAL HIGH (ref 0–149)
VLDL Cholesterol Cal: 40 mg/dL (ref 5–40)

## 2023-12-22 LAB — BASIC METABOLIC PANEL WITH GFR
BUN/Creatinine Ratio: 18 (ref 12–28)
BUN: 17 mg/dL (ref 8–27)
CO2: 23 mmol/L (ref 20–29)
Calcium: 9.5 mg/dL (ref 8.7–10.3)
Chloride: 101 mmol/L (ref 96–106)
Creatinine, Ser: 0.93 mg/dL (ref 0.57–1.00)
Glucose: 119 mg/dL — ABNORMAL HIGH (ref 70–99)
Potassium: 4.9 mmol/L (ref 3.5–5.2)
Sodium: 137 mmol/L (ref 134–144)
eGFR: 62 mL/min/1.73 (ref >59)

## 2023-12-27 ENCOUNTER — Ambulatory Visit: Admission: RE | Admit: 2023-12-27 | Discharge: 2023-12-27 | Attending: Internal Medicine | Admitting: Internal Medicine

## 2023-12-27 DIAGNOSIS — R7989 Other specified abnormal findings of blood chemistry: Secondary | ICD-10-CM | POA: Insufficient documentation

## 2023-12-28 ENCOUNTER — Ambulatory Visit: Admission: RE | Admit: 2023-12-28 | Discharge: 2023-12-28 | Attending: Internal Medicine | Admitting: Internal Medicine

## 2023-12-28 ENCOUNTER — Other Ambulatory Visit: Payer: Self-pay | Admitting: Physician Assistant

## 2023-12-28 DIAGNOSIS — R079 Chest pain, unspecified: Secondary | ICD-10-CM | POA: Diagnosis not present

## 2023-12-28 DIAGNOSIS — R7989 Other specified abnormal findings of blood chemistry: Secondary | ICD-10-CM | POA: Insufficient documentation

## 2023-12-28 MED ORDER — REGADENOSON 0.4 MG/5ML IV SOLN
0.4000 mg | Freq: Once | INTRAVENOUS | Status: AC
  Administered 2023-12-28: 10:00:00 0.4 mg via INTRAVENOUS

## 2023-12-28 MED ORDER — TECHNETIUM TC 99M TETROFOSMIN IV KIT
32.9700 | PACK | Freq: Once | INTRAVENOUS | Status: AC | PRN
  Administered 2023-12-28: 10:00:00 32.97 via INTRAVENOUS

## 2023-12-28 MED ORDER — TECHNETIUM TC 99M TETROFOSMIN IV KIT
11.1700 | PACK | Freq: Once | INTRAVENOUS | Status: AC | PRN
  Administered 2023-12-28: 08:00:00 11.17 via INTRAVENOUS

## 2023-12-28 NOTE — Progress Notes (Signed)
°  ° °  Sally-Ann S Mcpeters presented for a nuclear stress test today.  I Lesley LITTIE Maffucci, PA-C, provided direct supervision and was present during the stress portion of the study today, which was completed without significant symptoms, immediate complications, or acute ST/T changes on ECG.  Stress imaging is pending at this time.  Preliminary ECG findings may be listed in the chart, but the stress test result will not be finalized until perfusion imaging is complete.  Lesley LITTIE Maffucci, PA-C  12/28/2023, 9:50 AM

## 2023-12-29 LAB — NM MYOCAR MULTI W/SPECT W/WALL MOTION / EF
LV dias vol: 48 mL (ref 46–106)
LV sys vol: 11 mL (ref 3.8–5.2)
MPHR: 140 {beats}/min
Nuc Stress EF: 77 %
Peak HR: 95 {beats}/min
Percent HR: 67 %
Rest HR: 62 {beats}/min
Rest Nuclear Isotope Dose: 11.2 mCi
SDS: 0
SRS: 7
SSS: 0
ST Depression (mm): 0 mm
Stress Nuclear Isotope Dose: 33 mCi
TID: 1.2

## 2023-12-30 MED ORDER — ROSUVASTATIN CALCIUM 5 MG PO TABS: 5.0000 mg | ORAL_TABLET | Freq: Every day | ORAL | 3 refills | Status: AC

## 2024-01-27 ENCOUNTER — Other Ambulatory Visit: Payer: Self-pay | Admitting: Family Medicine

## 2024-01-27 DIAGNOSIS — F419 Anxiety disorder, unspecified: Secondary | ICD-10-CM

## 2024-01-29 ENCOUNTER — Other Ambulatory Visit: Payer: Self-pay | Admitting: Family Medicine

## 2024-02-13 ENCOUNTER — Encounter: Payer: Self-pay | Admitting: Family Medicine

## 2024-02-15 ENCOUNTER — Ambulatory Visit: Admitting: Internal Medicine

## 2024-02-15 ENCOUNTER — Encounter: Payer: Self-pay | Admitting: Internal Medicine

## 2024-02-15 VITALS — BP 139/58 | HR 71 | Ht 67.0 in | Wt 160.4 lb

## 2024-02-15 DIAGNOSIS — E782 Mixed hyperlipidemia: Secondary | ICD-10-CM

## 2024-02-15 DIAGNOSIS — R7989 Other specified abnormal findings of blood chemistry: Secondary | ICD-10-CM | POA: Diagnosis not present

## 2024-02-15 DIAGNOSIS — I10 Essential (primary) hypertension: Secondary | ICD-10-CM

## 2024-02-15 NOTE — Progress Notes (Unsigned)
 " Cardiology Office Note:  .   Date:  02/16/2024  ID:  Joan Baldwin, DOB [birth year], MRN [id number] PCP: Gasper Nancyann BRAVO, MD  [facility] Providers Cardiologist:  Lonni Hanson, MD     History of Present Illness: .   Joan Baldwin is a 79 y.o. female with history of hypertension, hepatitis C, chronic constipation, tobacco abuse, and depression/anxiety, who presents for follow-up of elevated troponin in the setting of UTI and rhabdomyolysis after a fall in 10/2023.  I last saw her on 12/21/2023, at which time she was feeling well, though her family believed that her legs were still a bit weak.  We agreed to perform a myocardial perfusion stress test given mildly elevated troponin during her hospitalization in October; this was low risk without evidence of ischemia or scar.  Today, Joan Baldwin reports that she has been feeling well without chest pain, shortness of breath, palpitations, lightheadedness, or edema.  Her only complaint is of some left arm pain when it is being stretched during physical therapy.  She has not had any falls or syncope.  ROS: See HPI  Studies Reviewed: SABRA   EKG Interpretation Date/Time:  Wednesday February 15 2024 13:51:48 EST Ventricular Rate:  71 PR Interval:  150 QRS Duration:  74 QT Interval:  398 QTC Calculation: 432 R Axis:   52  Text Interpretation: Normal sinus rhythm Nonspecific T wave abnormality Abnormal ECG When compared with ECG of 21-Dec-2023 14:10, No significant change was found Confirmed by Lawanda Holzheimer, Lonni [phone]) on 02/15/2024 1:56:52 PM    Pharmacologic MPI (10/28/2023): Low risk study without evidence of ischemia or scar.  LVEF greater than 65%.  Coronary artery calcification and aortic atherosclerosis noted on the attenuation correction images.  TTE (10/26/2023): Normal LV size with mild LVH. LVEF 60-65% with normal wall motion and grade 1 diastolic dysfunction. Normal RV size and function. Normal PA pressure. Normal biatrial  size. Normal mitral valve. Mild tricuspid regurgitation. Aortic sclerosis without stenosis. Normal CVP.   Risk Assessment/Calculations:            Physical Exam:   VS:  BP (!) 139/58 (BP Location: Left Arm, Patient Position: Sitting, Cuff Size: Normal)   Pulse 71 Comment: 64 oximeter  Ht 5' 7 (1.702 m)   Wt 160 lb 6.4 oz (72.8 kg)   SpO2 98%   BMI 25.12 kg/m    Wt Readings from Last 3 Encounters:  02/15/24 160 lb 6.4 oz (72.8 kg)  12/21/23 155 lb 12.8 oz (70.7 kg)  11/16/23 149 lb 14.4 oz (68 kg)    General:  NAD. Neck: No JVD or HJR. Lungs: Mildly diminished breath sounds throughout without wheezes or crackles. Heart: Regular rate and rhythm without murmurs, rubs, or gallops. Abdomen: Soft, nontender, nondistended. Extremities: No lower extremity edema.  ASSESSMENT AND PLAN: .    Elevated troponin and hyperlipidemia: This was identified in the setting of fall and UTI with concurrent rhabdomyolysis.  I suspect elevated troponin may have been due to skeletal muscle release and/or supply-demand mismatch.  Echocardiogram and myocardial perfusion stress test were reassuring with normal LVEF and absence of ischemia/scar.  No further testing or intervention recommended at this time.  Reasonable to continue aspirin  and rosuvastatin  given evidence of coronary artery calcification and aortic atherosclerosis.  Will check lipid panel, ALT, and CK today to assess response.  Hypertension: Systolic blood pressure borderline elevated.  Sodium restriction encouraged.  No medication changes at this time.    Dispo: Return to  clinic as needed.  Signed, Lonni Hanson, MD  "

## 2024-02-15 NOTE — Patient Instructions (Signed)
 Medication Instructions:  Your physician recommends that you continue on your current medications as directed. Please refer to the Current Medication list given to you today.    *If you need a refill on your cardiac medications before your next appointment, please call your pharmacy*  Lab Work: Your provider would like for you to have following labs drawn today      Testing/Procedures: No test ordered today   Follow-Up: At [facility], you and your health needs are our priority.  As part of our continuing mission to provide you with exceptional heart care, our providers are all part of one team.  This team includes your primary Cardiologist (physician) and Advanced Practice Providers or APPs (Physician Assistants and Nurse Practitioners) who all work together to provide you with the care you need, when you need it.  Your next appointment:   As needed   Provider:   You may see Lonni Hanson, MD or one of the following Advanced Practice Providers on your designated Care Team:   Lonni Meager, NP Lesley Maffucci, PA-C Bernardino Bring, PA-C Cadence [employer], PA-C Tylene Lunch, NP Barnie Hila, NP

## 2024-02-16 ENCOUNTER — Encounter: Payer: Self-pay | Admitting: Internal Medicine

## 2024-02-16 DIAGNOSIS — E782 Mixed hyperlipidemia: Secondary | ICD-10-CM | POA: Insufficient documentation

## 2024-02-16 LAB — LIPID PANEL
Chol/HDL Ratio: 3 ratio (ref 0.0–4.4)
Cholesterol, Total: 125 mg/dL (ref 100–199)
HDL: 42 mg/dL
LDL Chol Calc (NIH): 51 mg/dL (ref 0–99)
Triglycerides: 200 mg/dL — ABNORMAL HIGH (ref 0–149)
VLDL Cholesterol Cal: 32 mg/dL (ref 5–40)

## 2024-02-16 LAB — CK: Total CK: 46 U/L (ref 26–161)

## 2024-02-16 LAB — ALT: ALT: 9 [IU]/L (ref 0–32)

## 2024-06-27 ENCOUNTER — Ambulatory Visit

## 2024-09-12 ENCOUNTER — Ambulatory Visit
